# Patient Record
Sex: Male | Born: 1945 | Race: White | Hispanic: No | Marital: Married | Smoking: Never smoker
Health system: Southern US, Community
[De-identification: ages and names within clinical notes are randomized; demographics above are authoritative.]

## PROBLEM LIST (undated history)

## (undated) DIAGNOSIS — I728 Aneurysm of other specified arteries: Secondary | ICD-10-CM

## (undated) DIAGNOSIS — R41841 Cognitive communication deficit: Secondary | ICD-10-CM

## (undated) DIAGNOSIS — K219 Gastro-esophageal reflux disease without esophagitis: Secondary | ICD-10-CM

## (undated) DIAGNOSIS — M6281 Muscle weakness (generalized): Secondary | ICD-10-CM

## (undated) DIAGNOSIS — I69320 Aphasia following cerebral infarction: Secondary | ICD-10-CM

## (undated) DIAGNOSIS — I2510 Atherosclerotic heart disease of native coronary artery without angina pectoris: Secondary | ICD-10-CM

## (undated) DIAGNOSIS — G9340 Encephalopathy, unspecified: Secondary | ICD-10-CM

## (undated) DIAGNOSIS — I150 Renovascular hypertension: Secondary | ICD-10-CM

## (undated) DIAGNOSIS — R531 Weakness: Secondary | ICD-10-CM

## (undated) DIAGNOSIS — I639 Cerebral infarction, unspecified: Secondary | ICD-10-CM

## (undated) DIAGNOSIS — E870 Hyperosmolality and hypernatremia: Secondary | ICD-10-CM

## (undated) DIAGNOSIS — G4733 Obstructive sleep apnea (adult) (pediatric): Secondary | ICD-10-CM

## (undated) DIAGNOSIS — Z931 Gastrostomy status: Secondary | ICD-10-CM

## (undated) DIAGNOSIS — G459 Transient cerebral ischemic attack, unspecified: Secondary | ICD-10-CM

## (undated) DIAGNOSIS — R1310 Dysphagia, unspecified: Secondary | ICD-10-CM

## (undated) DIAGNOSIS — R339 Retention of urine, unspecified: Secondary | ICD-10-CM

## (undated) DIAGNOSIS — L8990 Pressure ulcer of unspecified site, unspecified stage: Secondary | ICD-10-CM

## (undated) DIAGNOSIS — E785 Hyperlipidemia, unspecified: Secondary | ICD-10-CM

## (undated) DIAGNOSIS — R001 Bradycardia, unspecified: Secondary | ICD-10-CM

## (undated) DIAGNOSIS — I619 Nontraumatic intracerebral hemorrhage, unspecified: Secondary | ICD-10-CM

## (undated) DIAGNOSIS — Z955 Presence of coronary angioplasty implant and graft: Secondary | ICD-10-CM

## (undated) DIAGNOSIS — R569 Unspecified convulsions: Secondary | ICD-10-CM

## (undated) DIAGNOSIS — N401 Enlarged prostate with lower urinary tract symptoms: Secondary | ICD-10-CM

## (undated) DIAGNOSIS — N319 Neuromuscular dysfunction of bladder, unspecified: Secondary | ICD-10-CM

## (undated) DIAGNOSIS — R471 Dysarthria and anarthria: Secondary | ICD-10-CM

## (undated) DIAGNOSIS — N183 Chronic kidney disease, stage 3 unspecified: Secondary | ICD-10-CM

## (undated) DIAGNOSIS — I480 Paroxysmal atrial fibrillation: Secondary | ICD-10-CM

## (undated) DIAGNOSIS — R279 Unspecified lack of coordination: Secondary | ICD-10-CM

## (undated) DIAGNOSIS — Z950 Presence of cardiac pacemaker: Secondary | ICD-10-CM

## (undated) DIAGNOSIS — I10 Essential (primary) hypertension: Secondary | ICD-10-CM

## (undated) DIAGNOSIS — M67911 Unspecified disorder of synovium and tendon, right shoulder: Secondary | ICD-10-CM

## (undated) DIAGNOSIS — G8190 Hemiplegia, unspecified affecting unspecified side: Secondary | ICD-10-CM

## (undated) DIAGNOSIS — I4891 Unspecified atrial fibrillation: Secondary | ICD-10-CM

## (undated) DIAGNOSIS — E119 Type 2 diabetes mellitus without complications: Secondary | ICD-10-CM

## (undated) DIAGNOSIS — N400 Enlarged prostate without lower urinary tract symptoms: Secondary | ICD-10-CM

## (undated) DIAGNOSIS — I252 Old myocardial infarction: Secondary | ICD-10-CM

## (undated) DIAGNOSIS — I69159 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: Secondary | ICD-10-CM

## (undated) HISTORY — PX: SPINAL CORD STIMULATOR IMPLANT: SHX2422

## (undated) HISTORY — PX: FEMUR SURGERY: SHX943

## (undated) HISTORY — PX: HIP SURGERY: SHX245

---

## 2016-04-12 DIAGNOSIS — R001 Bradycardia, unspecified: Secondary | ICD-10-CM | POA: Insufficient documentation

## 2020-07-27 DIAGNOSIS — I619 Nontraumatic intracerebral hemorrhage, unspecified: Secondary | ICD-10-CM | POA: Insufficient documentation

## 2020-08-17 DIAGNOSIS — R4701 Aphasia: Secondary | ICD-10-CM | POA: Insufficient documentation

## 2020-08-17 DIAGNOSIS — I69120 Aphasia following nontraumatic intracerebral hemorrhage: Secondary | ICD-10-CM | POA: Insufficient documentation

## 2020-08-17 DIAGNOSIS — G8191 Hemiplegia, unspecified affecting right dominant side: Secondary | ICD-10-CM | POA: Insufficient documentation

## 2020-11-21 DIAGNOSIS — Z8679 Personal history of other diseases of the circulatory system: Secondary | ICD-10-CM | POA: Insufficient documentation

## 2021-05-05 ENCOUNTER — Other Ambulatory Visit: Payer: Self-pay

## 2021-05-05 ENCOUNTER — Emergency Department (HOSPITAL_COMMUNITY): Payer: Medicare PPO

## 2021-05-05 ENCOUNTER — Encounter (HOSPITAL_COMMUNITY): Payer: Self-pay

## 2021-05-05 ENCOUNTER — Emergency Department (HOSPITAL_COMMUNITY)
Admission: EM | Admit: 2021-05-05 | Discharge: 2021-05-06 | Disposition: A | Discharge status: Home / Self Care | Payer: Medicare PPO | Attending: Emergency Medicine | Admitting: Emergency Medicine

## 2021-05-05 DIAGNOSIS — W1830XA Fall on same level, unspecified, initial encounter: Secondary | ICD-10-CM | POA: Insufficient documentation

## 2021-05-05 DIAGNOSIS — Z950 Presence of cardiac pacemaker: Secondary | ICD-10-CM | POA: Diagnosis not present

## 2021-05-05 DIAGNOSIS — E1122 Type 2 diabetes mellitus with diabetic chronic kidney disease: Secondary | ICD-10-CM | POA: Diagnosis not present

## 2021-05-05 DIAGNOSIS — S0083XA Contusion of other part of head, initial encounter: Secondary | ICD-10-CM | POA: Diagnosis not present

## 2021-05-05 DIAGNOSIS — N1832 Chronic kidney disease, stage 3b: Secondary | ICD-10-CM | POA: Insufficient documentation

## 2021-05-05 DIAGNOSIS — I129 Hypertensive chronic kidney disease with stage 1 through stage 4 chronic kidney disease, or unspecified chronic kidney disease: Secondary | ICD-10-CM | POA: Insufficient documentation

## 2021-05-05 DIAGNOSIS — Z955 Presence of coronary angioplasty implant and graft: Secondary | ICD-10-CM | POA: Insufficient documentation

## 2021-05-05 DIAGNOSIS — S0990XA Unspecified injury of head, initial encounter: Secondary | ICD-10-CM | POA: Diagnosis present

## 2021-05-05 DIAGNOSIS — S0033XA Contusion of nose, initial encounter: Secondary | ICD-10-CM | POA: Diagnosis not present

## 2021-05-05 DIAGNOSIS — F0390 Unspecified dementia without behavioral disturbance: Secondary | ICD-10-CM | POA: Diagnosis not present

## 2021-05-05 DIAGNOSIS — W19XXXA Unspecified fall, initial encounter: Secondary | ICD-10-CM

## 2021-05-05 HISTORY — DX: Pressure ulcer of unspecified site, unspecified stage: L89.90

## 2021-05-05 HISTORY — DX: Cerebral infarction, unspecified: I63.9

## 2021-05-05 HISTORY — DX: Old myocardial infarction: I25.2

## 2021-05-05 HISTORY — DX: Essential (primary) hypertension: I10

## 2021-05-05 HISTORY — DX: Unspecified convulsions: R56.9

## 2021-05-05 HISTORY — DX: Encephalopathy, unspecified: G93.40

## 2021-05-05 HISTORY — DX: Chronic kidney disease, stage 3 unspecified: N18.30

## 2021-05-05 HISTORY — DX: Aneurysm of other specified arteries: I72.8

## 2021-05-05 HISTORY — DX: Obstructive sleep apnea (adult) (pediatric): G47.33

## 2021-05-05 HISTORY — DX: 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: I69.159

## 2021-05-05 HISTORY — DX: Dysphagia, unspecified: R13.10

## 2021-05-05 HISTORY — DX: Unspecified atrial fibrillation: I48.91

## 2021-05-05 HISTORY — DX: Aphasia following cerebral infarction: I69.320

## 2021-05-05 HISTORY — DX: Presence of coronary angioplasty implant and graft: Z95.5

## 2021-05-05 HISTORY — DX: Benign prostatic hyperplasia with lower urinary tract symptoms: N40.1

## 2021-05-05 HISTORY — DX: Hemiplegia, unspecified affecting unspecified side: G81.90

## 2021-05-05 HISTORY — DX: Presence of cardiac pacemaker: Z95.0

## 2021-05-05 HISTORY — DX: Type 2 diabetes mellitus without complications: E11.9

## 2021-05-05 HISTORY — DX: Neuromuscular dysfunction of bladder, unspecified: N31.9

## 2021-05-05 HISTORY — DX: Hyperlipidemia, unspecified: E78.5

## 2021-05-05 HISTORY — DX: Dysarthria and anarthria: R47.1

## 2021-05-05 HISTORY — DX: Cognitive communication deficit: R41.841

## 2021-05-05 HISTORY — DX: Weakness: R53.1

## 2021-05-05 HISTORY — DX: Muscle weakness (generalized): M62.81

## 2021-05-05 HISTORY — DX: Renovascular hypertension: I15.0

## 2021-05-05 HISTORY — DX: Benign prostatic hyperplasia without lower urinary tract symptoms: N40.0

## 2021-05-05 HISTORY — DX: Paroxysmal atrial fibrillation: I48.0

## 2021-05-05 HISTORY — DX: Transient cerebral ischemic attack, unspecified: G45.9

## 2021-05-05 HISTORY — DX: Atherosclerotic heart disease of native coronary artery without angina pectoris: I25.10

## 2021-05-05 HISTORY — DX: Nontraumatic intracerebral hemorrhage, unspecified: I61.9

## 2021-05-05 HISTORY — DX: Unspecified lack of coordination: R27.9

## 2021-05-05 HISTORY — DX: Gastrostomy status: Z93.1

## 2021-05-05 HISTORY — DX: Unspecified disorder of synovium and tendon, right shoulder: M67.911

## 2021-05-05 HISTORY — DX: Retention of urine, unspecified: R33.9

## 2021-05-05 HISTORY — DX: Gastro-esophageal reflux disease without esophagitis: K21.9

## 2021-05-05 HISTORY — DX: Bradycardia, unspecified: R00.1

## 2021-05-05 HISTORY — DX: Hyperosmolality and hypernatremia: E87.0

## 2021-05-05 NOTE — ED Provider Notes (Signed)
?[facility] ?Provider Note ? ? ?CSN: [id number] ?Arrival date & time: 05/05/21  1914 ? ?  ? ?History ? ?Chief Complaint  ?Patient presents with  ? Fall  ? ? ?Dylan Martin is a 76 y.o. male. ? ? ?Fall ?Pertinent negatives include no chest pain and no abdominal pain. Patient presents from nursing home.  Found on the floor.  Swelling on forehead.  Without complaint but has baseline dementia.  Contractions of lower extremities.  Hematoma on right forehead.  Not on blood thinners.  Unknown loss conscious. ? ?  ?Past Medical History:  ?Diagnosis Date  ? 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 Aphasia following cerebral infarction   ? Atherosclerotic heart disease of native coronary artery without angina pectoris   ? Benign prostatic hyperplasia with lower urinary tract symptoms   ? Benign prostatic hyperplasia without lower urinary tract symptoms   ? Bradycardia, unspecified   ? Cerebral infarction [facility])   ? Chronic kidney disease, stage 3 unspecified ([place])   ? Cognitive communication deficit   ? Dysarthria   ? Dysphagia   ? Encephalopathy   ? Essential (primary) hypertension   ? Gastro-esophageal reflux disease without esophagitis   ? 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 Hemiplegia ([place])   ? 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 Hyperlipidemia   ? Hyperosmolality and hypernatremia   ? Muscle weakness (generalized)   ? Myocardial infarct, old   ? Neuromuscular dysfunction of bladder, unspecified   ? Nontraumatic intracerebral hemorrhage (HCC)   ? Nontraumatic intracerebral hemorrhage (HCC)   ? Obstructive sleep apnea   ? Paroxysmal atrial fibrillation (HCC)   ? Presence of cardiac pacemaker   ? Presence of coronary angioplasty implant and graft   ? Pressure ulcer   ? Renovascular hypertension   ? Retention of urine   ? Seizures ([place])   ? TIA (transient ischemic attack)   ? Type 2 diabetes mellitus without complication (HCC)   ?  Unspecified atrial fibrillation ([place])   ? Unspecified disorder of synovium and tendon, right shoulder   ? Unspecified lack of coordination   ? Weakness   ? ? ?Home Medications ?Prior to Admission medications   ?Not on File  ?   ? ?Allergies    ?Shellfish allergy   ? ?Review of Systems   ?Review of Systems  ?Cardiovascular:  Negative for chest pain.  ?Gastrointestinal:  Negative for abdominal pain.  ? ?Physical Exam ?Updated Vital Signs ?BP 123/73   Pulse (!) 58   Temp 98.4 ?F (36.9 ?C) (Oral)   Resp 19   Ht '5\' 10"'$  (1.778 m)   Wt 81.6 kg   SpO2 (P) 93%   BMI 25.83 kg/m?  ?Physical Exam ?Vitals and nursing note reviewed.  ?HENT:  ?   Head:  ?   Comments: Hematoma right supraorbital area.  Abrasion/superficial laceration to bridge of nose.  No midline cervical spine tenderness.  Jaw nontender. ?Cardiovascular:  ?   Rate and Rhythm: Regular rhythm.  ?Neurological:  ?   Mental Status: He is alert.  ?   Comments: At reported baseline.  Confused and difficult to understand.  Contractions of extremities.  No tenderness to extremities.  ? ? ?ED Results / Procedures / Treatments   ?Labs ?(all labs ordered are listed, but only abnormal results are displayed) ?Labs Reviewed - No data to display ? ?EKG ?None ? ?Radiology ?CT Head Wo Contrast ? ?Result Date: 05/05/2021 ?  CLINICAL DATA:  Facial trauma, blunt; Neck trauma (Age >= 65y); Head trauma, minor (Age >= 65y). Fall unwitnessed EXAM: CT HEAD WITHOUT CONTRAST CT MAXILLOFACIAL WITHOUT CONTRAST CT CERVICAL SPINE WITHOUT CONTRAST TECHNIQUE: Multidetector CT imaging of the head, cervical spine, and maxillofacial structures were performed using the standard protocol without intravenous contrast. Multiplanar CT image reconstructions of the cervical spine and maxillofacial structures were also generated. RADIATION DOSE REDUCTION: This exam was performed according to the departmental dose-optimization program which includes automated exposure control, adjustment of the mA and/or  kV according to patient size and/or use of iterative reconstruction technique. COMPARISON:  None. FINDINGS: CT HEAD FINDINGS BRAIN: BRAIN Cerebral ventricle sizes are concordant with the degree of cerebral volume loss. Patchy and confluent areas of decreased attenuation are noted throughout the deep and periventricular [place] matter of the cerebral hemispheres bilaterally, compatible with chronic microvascular ischemic disease. No evidence of large-territorial acute infarction. No parenchymal hemorrhage. No mass lesion. No extra-axial collection. No mass effect or midline shift. No hydrocephalus. Basilar cisterns are patent. Vascular: No hyperdense vessel. Atherosclerotic calcifications are present within the cavernous internal carotid and vertebral arteries. Skull: No acute fracture or focal lesion. Other: Midline slightly rightward lower frontal scalp hematoma formation measuring up to 9 mm. CT MAXILLOFACIAL FINDINGS Osseous: No fracture or mandibular dislocation. No destructive process. Sinuses/Orbits: Paranasal sinuses and mastoid air cells are clear. The orbits are unremarkable. Soft tissues: Negative. CT CERVICAL SPINE FINDINGS Alignment: Normal. Skull base and vertebrae: Multilevel degenerative changes of the spine with associated severe osseous neural foraminal stenosis at the bilateral C3-C4, C4-C5, C5-C6 levels. No acute fracture. No aggressive appearing focal osseous lesion or focal pathologic process. Soft tissues and spinal canal: No prevertebral fluid or swelling. No visible canal hematoma. Upper chest: Unremarkable. Other: Atherosclerotic plaque of the aortic arch and its branches. IMPRESSION: 1. No acute intracranial abnormality. 2. No acute displaced facial fracture. 3. No acute displaced fracture or traumatic listhesis of the cervical spine. 4. Multilevel degenerative changes of the spine with associated severe osseous neural foraminal stenosis at the bilateral C3-C4, C4-C5, C5-C6 levels. 5.  Aortic  Atherosclerosis (ICD10-I70.0). Electronically Signed   By: Iven Finn M.D.   On: 05/05/2021 21:33  ? ?CT Cervical Spine Wo Contrast ? ?Result Date: 05/05/2021 ?CLINICAL DATA:  Facial trauma, blunt; Neck trauma (Age >= 65y); Head trauma, minor (Age >= 65y). Fall unwitnessed EXAM: CT HEAD WITHOUT CONTRAST CT MAXILLOFACIAL WITHOUT CONTRAST CT CERVICAL SPINE WITHOUT CONTRAST TECHNIQUE: Multidetector CT imaging of the head, cervical spine, and maxillofacial structures were performed using the standard protocol without intravenous contrast. Multiplanar CT image reconstructions of the cervical spine and maxillofacial structures were also generated. RADIATION DOSE REDUCTION: This exam was performed according to the departmental dose-optimization program which includes automated exposure control, adjustment of the mA and/or kV according to patient size and/or use of iterative reconstruction technique. COMPARISON:  None. FINDINGS: CT HEAD FINDINGS BRAIN: BRAIN Cerebral ventricle sizes are concordant with the degree of cerebral volume loss. Patchy and confluent areas of decreased attenuation are noted throughout the deep and periventricular [place] matter of the cerebral hemispheres bilaterally, compatible with chronic microvascular ischemic disease. No evidence of large-territorial acute infarction. No parenchymal hemorrhage. No mass lesion. No extra-axial collection. No mass effect or midline shift. No hydrocephalus. Basilar cisterns are patent. Vascular: No hyperdense vessel. Atherosclerotic calcifications are present within the cavernous internal carotid and vertebral arteries. Skull: No acute fracture or focal lesion. Other: Midline slightly rightward lower frontal scalp hematoma formation measuring up  to 9 mm. CT MAXILLOFACIAL FINDINGS Osseous: No fracture or mandibular dislocation. No destructive process. Sinuses/Orbits: Paranasal sinuses and mastoid air cells are clear. The orbits are unremarkable. Soft tissues:  Negative. CT CERVICAL SPINE FINDINGS Alignment: Normal. Skull base and vertebrae: Multilevel degenerative changes of the spine with associated severe osseous neural foraminal stenosis at the bilateral C3-C4, C4-C

## 2021-05-05 NOTE — ED Notes (Signed)
Pt returned from CT °

## 2021-05-05 NOTE — ED Triage Notes (Signed)
Pt brought in by [facility] from [facility] for unwitnessed fall. Pt has swelling and abrasion to bridge of nose. Pt not on blood thinners.  ?

## 2021-05-05 NOTE — ED Notes (Signed)
Report called to SNF. EMS called for transport. ?

## 2021-05-05 NOTE — Discharge Instructions (Signed)
The CAT scan showed some swelling above his eye but no other severe injury. ?

## 2021-05-05 NOTE — ED Triage Notes (Signed)
Pt arrived to ED via [facility] from [facility]. Pt was found on floor at facility, unwitnessed fall. Pt has swelling on forehead and nose. Pt denies pain at this time. Pt is confused on arrival, is baseline per report. Pt alert, oriented to person, moving as per normal.  ?

## 2021-05-21 ENCOUNTER — Ambulatory Visit (INDEPENDENT_AMBULATORY_CARE_PROVIDER_SITE_OTHER): Payer: Medicare PPO | Admitting: Urology

## 2021-05-21 ENCOUNTER — Encounter: Payer: Self-pay | Admitting: Urology

## 2021-05-21 VITALS — BP 115/75 | HR 76 | Ht 69.0 in | Wt 176.0 lb

## 2021-05-21 DIAGNOSIS — N319 Neuromuscular dysfunction of bladder, unspecified: Secondary | ICD-10-CM | POA: Diagnosis not present

## 2021-05-21 DIAGNOSIS — Z435 Encounter for attention to cystostomy: Secondary | ICD-10-CM | POA: Diagnosis not present

## 2021-05-21 DIAGNOSIS — R339 Retention of urine, unspecified: Secondary | ICD-10-CM | POA: Diagnosis not present

## 2021-05-21 NOTE — Progress Notes (Signed)
Suprapubic Cath Change ? ?Patient is present today for a suprapubic catheter change due to urinary retention.  18m of water was drained from the balloon, a 20FR foley cath was removed from the tract with out difficulty.  Site was cleaned and prepped in a sterile fashion with betadine.  A 20FR foley cath was replaced into the tract no complications were noted. Urine return was noted, 10 ml of sterile water was inflated into the balloon and a bed bag was attached for drainage.  Patient tolerated well. A night bag was given to patient and proper instruction was given on how to switch bags.   ? ?Performed by: [place] LPN ? ?Follow up: Follow up as scheduled.   ?

## 2021-05-21 NOTE — Progress Notes (Addendum)
Assessment: 1. Neurogenic bladder   2. Encounter for care or replacement of suprapubic tube [facility])     Plan: SP tube changed today with 20 French Foley catheter. D/C tamsulosin - not indicated with SP tube Return to office in 1 month for SP tube change. Please transport patient on a stretcher to facilitate SP tube change.  Addendum from 06/08/2021: Review of outside records from Dr. Buelah Manis indicates the patient was found to have bilateral renal masses on CT imaging from 01/31/2021.  A 4.2 cm renal mass was noted on the right.  A 1.6 cm heterogeneous mass was noted on the left.  Both of these masses concerning for malignancy by report.  Also noted was an ill-defined lytic lesion in the L2 vertebral body.  CT scan from December 2022 showed no evidence of pulmonary metastasis.  Patient at that time were for close follow-up with a repeat CT scan of the chest and abdomen in July 2023.  Chief Complaint:  Chief Complaint  Patient presents with   neurogenic bladder    History of Present Illness:  Dylan Martin is a 76 y.o. year old male who is seen in consultation from Lavone Neri, AGNP-C for evaluation of a neurogenic bladder and chronic suprapubic tube.  He has recently relocated to the area and is currently going to rehab at [facility] facility.  He had a SP tube placed in October 2022 while living in the [place] area.  This was done for urinary retention and a neurogenic bladder.  The catheter has been draining well.  He has undergone monthly tube changes by the urologist there.  His tube was last changed on April 17, 2021.  He has not had problems with UTIs.  No recent gross hematuria.   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[place])    Aphasia following cerebral infarction    Atherosclerotic heart disease of native coronary artery without angina pectoris    Benign prostatic hyperplasia with lower urinary tract  symptoms    Benign prostatic hyperplasia without lower urinary tract symptoms    Bradycardia, unspecified    Cerebral infarction ([place])    Chronic kidney disease, stage 3 unspecified (HCC)    Cognitive communication deficit    Dysarthria    Dysphagia    Encephalopathy    Essential (primary) hypertension    Gastro-esophageal reflux disease without esophagitis    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[place])    Hemiplegia (HCC)    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)    Hyperlipidemia    Hyperosmolality and hypernatremia    Muscle weakness (generalized)    Myocardial infarct, old    Neuromuscular dysfunction of bladder, unspecified    Nontraumatic intracerebral hemorrhage (HCC)    Nontraumatic intracerebral hemorrhage (HCC)    Obstructive sleep apnea    Paroxysmal atrial fibrillation (HCC)    Presence of cardiac pacemaker    Presence of coronary angioplasty implant and graft    Pressure ulcer    Renovascular hypertension    Retention of urine    Seizures (HCC)    TIA (transient ischemic attack)    Type 2 diabetes mellitus without complication ([place])    Unspecified atrial fibrillation (HCC)    Unspecified disorder of synovium and tendon, right shoulder    Unspecified lack of coordination    Weakness     Past Surgical History:  Past Surgical History:  Procedure Laterality Date  FEMUR SURGERY Left    HIP SURGERY Left    SPINAL CORD STIMULATOR IMPLANT     per wife nerve stimulator in back    Allergies:  Allergies  Allergen Reactions   Aspirin Other (See Comments)    dr told pt not to take ASA after aneurysm   Shellfish Allergy     Family History:  No family history on file.  Social History:  Social History   Tobacco Use   Smoking status: Never   Smokeless tobacco: Never  Vaping Use   Vaping Use: Never used  Substance Use Topics   Alcohol use: Not Currently   Drug use: Never    Review of symptoms:   Constitutional:  Negative for unexplained weight loss, night sweats, fever, chills ENT:  Negative for nose bleeds, sinus pain, painful swallowing CV:  Negative for chest pain, shortness of breath, exercise intolerance, palpitations, loss of consciousness Resp:  Negative for cough, wheezing, shortness of breath GI:  Negative for nausea, vomiting, diarrhea, bloody stools GU:  Positives noted in HPI; otherwise negative for gross hematuria, dysuria, urinary incontinence Neuro:  Negative for seizures, poor balance, limb weakness, slurred speech Psych:  Negative for lack of energy, depression, anxiety Endocrine:  Negative for polydipsia, polyuria, symptoms of hypoglycemia (dizziness, hunger, sweating) Hematologic:  Negative for anemia, purpura, petechia, prolonged or excessive bleeding, use of anticoagulants  Allergic:  Negative for difficulty breathing or choking as a result of exposure to anything; no shellfish allergy; no allergic response (rash/itch) to materials, foods  Physical exam: BP 115/75   Pulse 76   Ht '5\' 9"'$  (1.753 m)   Wt 176 lb (79.8 kg)   BMI 25.99 kg/m  GENERAL APPEARANCE:  Chronically ill appearing male, NAD HEENT: Atraumatic, Normocephalic, oropharynx clear. NECK: Supple without lymphadenopathy or thyromegaly. LUNGS: Clear to auscultation bilaterally. HEART: Regular Rate and Rhythm without murmurs, gallops, or rubs. ABDOMEN: Soft, non-tender, No Masses. SP tube in lower abdomen draining clear urine EXTREMITIES: LE contractures; Without clubbing, cyanosis, or edema. NEUROLOGIC:  CN II-XII grossly intact.  MENTAL STATUS:  Non-communicative BACK:  Non-tender to palpation.  No CVAT SKIN:  Warm, dry and intact.    Results: None

## 2021-05-28 ENCOUNTER — Ambulatory Visit: Payer: Medicare PPO | Admitting: Podiatry

## 2021-06-07 ENCOUNTER — Encounter: Payer: Self-pay | Admitting: Podiatry

## 2021-06-07 ENCOUNTER — Ambulatory Visit (INDEPENDENT_AMBULATORY_CARE_PROVIDER_SITE_OTHER): Payer: Medicare PPO | Admitting: Podiatry

## 2021-06-07 DIAGNOSIS — E119 Type 2 diabetes mellitus without complications: Secondary | ICD-10-CM

## 2021-06-07 DIAGNOSIS — M109 Gout, unspecified: Secondary | ICD-10-CM | POA: Insufficient documentation

## 2021-06-07 DIAGNOSIS — B351 Tinea unguium: Secondary | ICD-10-CM

## 2021-06-07 DIAGNOSIS — I10 Essential (primary) hypertension: Secondary | ICD-10-CM | POA: Insufficient documentation

## 2021-06-07 DIAGNOSIS — M79675 Pain in left toe(s): Secondary | ICD-10-CM | POA: Diagnosis not present

## 2021-06-07 DIAGNOSIS — M79674 Pain in right toe(s): Secondary | ICD-10-CM

## 2021-06-07 DIAGNOSIS — I6990 Unspecified sequelae of unspecified cerebrovascular disease: Secondary | ICD-10-CM | POA: Insufficient documentation

## 2021-06-07 DIAGNOSIS — M21962 Unspecified acquired deformity of left lower leg: Secondary | ICD-10-CM | POA: Diagnosis not present

## 2021-06-07 DIAGNOSIS — M2170 Unequal limb length (acquired), unspecified site: Secondary | ICD-10-CM

## 2021-06-07 DIAGNOSIS — Z719 Counseling, unspecified: Secondary | ICD-10-CM | POA: Insufficient documentation

## 2021-06-07 DIAGNOSIS — M21372 Foot drop, left foot: Secondary | ICD-10-CM

## 2021-06-07 DIAGNOSIS — K219 Gastro-esophageal reflux disease without esophagitis: Secondary | ICD-10-CM | POA: Insufficient documentation

## 2021-06-07 NOTE — Patient Instructions (Addendum)
Instructions for facility: -Please check hemoglobin A1c and fax results to me at [phone] -Appoint will be scheduled if he qualifies for diabetic shoes and brace adjustment -Qualify for diabetic shoes he must have a diagnosis of diabetes actively managed.  Prediabetes does not qualify.  Paperwork will need to be signed by MD or DO at facility acknowledging diagnosis and active management that we will fax to them after shoe fitting -Routine appointments will be scheduled every 3 months for nail care

## 2021-06-11 NOTE — Progress Notes (Signed)
  Subjective:  Patient ID: Dylan Martin, male    DOB: [birth year],  MRN: [id number]  Chief Complaint  Patient presents with   Diabetes    [facility]  nail trim  - eval to see if pt needs diabetic shoes    76 y.o. male presents with the above complaint. History confirmed with patient.  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  However he has a limb discrepancy and his left leg is shorter and they would like to see if there is a heel lift that can be applied to his shoe to make the brace more functional..  He was previously diagnosed with diabetes but is no longer on medications for them and is diet controlled now  Objective:  Physical Exam: warm, good capillary refill, no trophic changes or ulcerative lesions, normal DP and PT pulses, normal sensory exam, and approximately 2 cm shorter limb length on left than right.  He has 2 out of 5 strength in plantarflexion on the left side, 1 out of 5 in dorsiflexion Left Foot: dystrophic yellowed discolored nail plates with subungual debris Right Foot: dystrophic yellowed discolored nail plates with subungual debris  Assessment:   1. Acquired deformity of left lower leg   2. Acquired unequal limb length   3. Pain due to onychomycosis of toenails of both feet   4. Diet-controlled diabetes mellitus ([place])      Plan:  Patient was evaluated and treated and all questions answered.  Patient educated on diabetes. Discussed proper diabetic foot care and discussed risks and complications of disease. Educated patient in depth on reasons to return to the office immediately should he/she discover anything concerning or new on the feet. All questions answered. Discussed proper shoes as well.  His diabetes is well controlled he is off medications and is now diet controlled.  I discussed that he may not  qualify for diabetic shoes due to this which they understand.  For his foot drop and acquired limb length discrepancy he does have a brace that they are hopeful he can do more therapy with if it is able to be used but the limb with discrepancy has been difficult to make this happen.  He will be seen by our pedorthist to evaluate  Discussed the etiology and treatment options for the condition in detail with the patient. Educated patient on the topical and oral treatment options for mycotic nails. Recommended debridement of the nails today. Sharp and mechanical debridement performed of all painful and mycotic nails today. Nails debrided in length and thickness using a nail nipper to level of comfort. Discussed treatment options including appropriate shoe gear. Follow up as needed for painful nails.   Return in about 3 months (around 09/07/2021) for at risk foot care.

## 2021-06-19 ENCOUNTER — Ambulatory Visit: Payer: Medicare PPO

## 2021-06-19 DIAGNOSIS — M21962 Unspecified acquired deformity of left lower leg: Secondary | ICD-10-CM

## 2021-06-19 NOTE — Progress Notes (Signed)
Patient seen for consultation for diabetic shoes. Patient has an existing brace from [facility]. Patient is currently in a skilled nursing facility and does not have an established primary care physician or specialist who can certify he has diabetes. Caregiver will establish primary care and determine when he enters Medicare Part B. Caregiver will reach back out to [place] to schedule an appointment when he is eligible for shoes.

## 2021-06-21 ENCOUNTER — Ambulatory Visit: Payer: Medicare PPO | Admitting: Urology

## 2021-06-21 NOTE — Progress Notes (Deleted)
Assessment: 1. Neurogenic bladder   2. Encounter for care or replacement of suprapubic tube ([place])   3. Bilateral renal masses      Plan: SP tube changed today with 20 French Foley catheter. D/C tamsulosin - not indicated with SP tube Return to office in 1 month for SP tube change. Please transport patient on a stretcher to facilitate SP tube change.  Addendum from 06/08/2021: Review of outside records from Dr. Buelah Manis indicates the patient was found to have bilateral renal masses on CT imaging from 01/31/2021.  A 4.2 cm renal mass was noted on the right.  A 1.6 cm heterogeneous mass was noted on the left.  Both of these masses concerning for malignancy by report.  Also noted was an ill-defined lytic lesion in the L2 vertebral body.  CT scan from December 2022 showed no evidence of pulmonary metastasis.  Patient at that time were for close follow-up with a repeat CT scan of the chest and abdomen in July 2023.  Chief Complaint:  No chief complaint on file.   History of Present Illness:  Dylan Martin is a 76 y.o. year old male who is seen for further evaluation of a neurogenic bladder and chronic suprapubic tube.  He has recently relocated to the area and is currently going to rehab at [facility] facility.  He had a SP tube placed in October 2022 while living in the [place] area.  This was done for urinary retention and a neurogenic bladder.  The catheter has been draining well.  He has undergone monthly tube changes by the urologist there.  He has not had problems with UTIs.  No recent gross hematuria.  The SP tube was last changed on 05/21/2021.  Review of outside records from Dr. Buelah Manis indicates the patient was found to have bilateral renal masses on CT imaging from 01/31/2021.  A 4.2 cm renal mass was noted on the right.  A 1.6 cm heterogeneous mass was noted on the left.  Both of these masses were concerning for malignancy by report.  Also noted was an ill-defined lytic  lesion in the L2 vertebral body.  CT scan from December 2022 showed no evidence of pulmonary metastasis.  Plans at that time were for close follow-up with a repeat CT scan of the chest and abdomen in July 2023.  Portions of the above documentation were copied from a prior visit for review purposes only.   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[place])    Aphasia following cerebral infarction    Atherosclerotic heart disease of native coronary artery without angina pectoris    Benign prostatic hyperplasia with lower urinary tract symptoms    Benign prostatic hyperplasia without lower urinary tract symptoms    Bradycardia, unspecified    Cerebral infarction ([place])    Chronic kidney disease, stage 3 unspecified (HCC)    Cognitive communication deficit    Dysarthria    Dysphagia    Encephalopathy    Essential (primary) hypertension    Gastro-esophageal reflux disease without esophagitis    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HCC)    Hemiplegia (HCC)    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)    Hyperlipidemia    Hyperosmolality and hypernatremia    Muscle weakness (generalized)    Myocardial infarct, old    Neuromuscular dysfunction of bladder, unspecified    Nontraumatic intracerebral hemorrhage (HCC)    Nontraumatic  intracerebral hemorrhage (HCC)    Obstructive sleep apnea    Paroxysmal atrial fibrillation (HCC)    Presence of cardiac pacemaker    Presence of coronary angioplasty implant and graft    Pressure ulcer    Renovascular hypertension    Retention of urine    Seizures (HCC)    TIA (transient ischemic attack)    Type 2 diabetes mellitus without complication (HCC)    Unspecified atrial fibrillation (HCC)    Unspecified disorder of synovium and tendon, right shoulder    Unspecified lack of coordination    Weakness     Past Surgical History:  Past Surgical  History:  Procedure Laterality Date   FEMUR SURGERY Left    HIP SURGERY Left    SPINAL CORD STIMULATOR IMPLANT     per wife nerve stimulator in back    Allergies:  Allergies  Allergen Reactions   Aspirin Other (See Comments)    dr told pt not to take ASA after aneurysm   Shellfish Allergy     Family History:  No family history on file.  Social History:  Social History   Tobacco Use   Smoking status: Never   Smokeless tobacco: Never  Vaping Use   Vaping Use: Never used  Substance Use Topics   Alcohol use: Not Currently   Drug use: Never    ROS: Constitutional:  Negative for fever, chills, weight loss CV: Negative for chest pain, previous MI, hypertension Respiratory:  Negative for shortness of breath, wheezing, sleep apnea, frequent cough GI:  Negative for nausea, vomiting, bloody stool, GERD  Physical exam: There were no vitals taken for this visit. GENERAL APPEARANCE:  Well appearing, well developed, well nourished, NAD HEENT:  Atraumatic, normocephalic, oropharynx clear NECK:  Supple without lymphadenopathy or thyromegaly ABDOMEN:  Soft, non-tender, no masses EXTREMITIES:  Moves all extremities well, without clubbing, cyanosis, or edema NEUROLOGIC:  Alert and oriented x 3, normal gait, CN II-XII grossly intact MENTAL STATUS:  appropriate BACK:  Non-tender to palpation, No CVAT SKIN:  Warm, dry, and intact  Results: U/A:

## 2021-06-25 ENCOUNTER — Telehealth: Payer: Self-pay

## 2021-06-25 ENCOUNTER — Other Ambulatory Visit: Payer: Self-pay

## 2021-06-25 DIAGNOSIS — Z95810 Presence of automatic (implantable) cardiac defibrillator: Secondary | ICD-10-CM

## 2021-06-25 NOTE — Telephone Encounter (Signed)
LVM returning patient's wife's phonecall

## 2021-06-26 ENCOUNTER — Ambulatory Visit: Payer: Medicare PPO | Admitting: Cardiovascular Disease

## 2021-06-28 ENCOUNTER — Telehealth: Payer: Self-pay

## 2021-06-28 ENCOUNTER — Ambulatory Visit: Payer: Medicare PPO | Admitting: Urology

## 2021-06-28 NOTE — Telephone Encounter (Signed)
Pts wife lvm for call back from [place].

## 2021-06-28 NOTE — Telephone Encounter (Signed)
LVM returning wife's phonecall

## 2021-06-29 ENCOUNTER — Ambulatory Visit (INDEPENDENT_AMBULATORY_CARE_PROVIDER_SITE_OTHER): Payer: Medicare PPO | Admitting: Urology

## 2021-06-29 ENCOUNTER — Encounter: Payer: Self-pay | Admitting: Urology

## 2021-06-29 DIAGNOSIS — R339 Retention of urine, unspecified: Secondary | ICD-10-CM

## 2021-06-29 DIAGNOSIS — N319 Neuromuscular dysfunction of bladder, unspecified: Secondary | ICD-10-CM | POA: Diagnosis not present

## 2021-06-29 DIAGNOSIS — R8290 Unspecified abnormal findings in urine: Secondary | ICD-10-CM | POA: Diagnosis not present

## 2021-06-29 DIAGNOSIS — N2889 Other specified disorders of kidney and ureter: Secondary | ICD-10-CM | POA: Diagnosis not present

## 2021-06-29 DIAGNOSIS — Z435 Encounter for attention to cystostomy: Secondary | ICD-10-CM

## 2021-06-29 LAB — MICROSCOPIC EXAMINATION
RBC, Urine: >30 /hpf — AB (ref 0–2)
Renal Epithel, UA: NONE SEEN /hpf
WBC, UA: >30 /hpf — AB (ref 0–5)

## 2021-06-29 LAB — URINALYSIS, ROUTINE W REFLEX MICROSCOPIC
Bilirubin, UA: NEGATIVE
Glucose, UA: NEGATIVE
Ketones, UA: NEGATIVE
Nitrite, UA: NEGATIVE
Specific Gravity, UA: <1.005 — ABNORMAL LOW (ref 1.005–1.030)
Urobilinogen, Ur: 0.2 mg/dL (ref 0.2–1.0)
pH, UA: 6 (ref 5.0–7.5)

## 2021-06-29 NOTE — Progress Notes (Signed)
Assessment: 1. Neurogenic bladder   2. Encounter for care or replacement of suprapubic tube ([place])   3. Bilateral renal masses     Plan: SP tube changed today with 20 French Foley catheter. Return to office in 1 month for SP tube change. Urine culture sent for documentation purposes CMP today Schedule for CT chest without contrast and CT abdomen with and without contrast at [facility]  Please transport patient to Urology office on a stretcher to facilitate SP tube change.  Chief Complaint:  Chief Complaint  Patient presents with   Neurogenic bladder    History of Present Illness:  Dylan Martin is a 76 y.o. year old male who is seen for further evaluation of a neurogenic bladder and chronic suprapubic tube.  He has recently relocated to the area and is currently going to rehab at [facility] facility.  He had a SP tube placed in October 2022 while living in the [place] area.  This was done for urinary retention and a neurogenic bladder.  The catheter has been draining well.  He has undergone monthly tube changes by the urologist there.  He has not had problems with UTIs.  No recent gross hematuria.  The SP tube was last changed on 05/21/2021.  Review of outside records from Dr. Buelah Manis indicates the patient was found to have bilateral renal masses on CT imaging from 01/31/2021.  A 4.2 cm renal mass was noted on the right.  A 1.6 cm heterogeneous mass was noted on the left.  Both of these masses were concerning for malignancy by report.  Also noted was an ill-defined lytic lesion in the L2 vertebral body.  CT scan from December 2022 showed no evidence of pulmonary metastasis.  Plans at that time were for close follow-up with a repeat CT scan of the chest and abdomen in July 2023.  He presents today for suprapubic tube change.  His SP tube has been draining well.  His urine has been fairly clear.  No gross hematuria.  No fevers or chills.  Portions of the above documentation  were copied from a prior visit for review purposes only.   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[place])    Aphasia following cerebral infarction    Atherosclerotic heart disease of native coronary artery without angina pectoris    Benign prostatic hyperplasia with lower urinary tract symptoms    Benign prostatic hyperplasia without lower urinary tract symptoms    Bradycardia, unspecified    Cerebral infarction ([place])    Chronic kidney disease, stage 3 unspecified (HCC)    Cognitive communication deficit    Dysarthria    Dysphagia    Encephalopathy    Essential (primary) hypertension    Gastro-esophageal reflux disease without esophagitis    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[place])    Hemiplegia (HCC)    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)    Hyperlipidemia    Hyperosmolality and hypernatremia    Muscle weakness (generalized)    Myocardial infarct, old    Neuromuscular dysfunction of bladder, unspecified    Nontraumatic intracerebral hemorrhage (HCC)    Nontraumatic intracerebral hemorrhage (HCC)    Obstructive sleep apnea    Paroxysmal atrial fibrillation (HCC)    Presence of cardiac pacemaker    Presence of coronary angioplasty implant and graft    Pressure ulcer    Renovascular hypertension    Retention of urine  Seizures ([place])    TIA (transient ischemic attack)    Type 2 diabetes mellitus without complication (HCC)    Unspecified atrial fibrillation (HCC)    Unspecified disorder of synovium and tendon, right shoulder    Unspecified lack of coordination    Weakness     Past Surgical History:  Past Surgical History:  Procedure Laterality Date   FEMUR SURGERY Left    HIP SURGERY Left    SPINAL CORD STIMULATOR IMPLANT     per wife nerve stimulator in back    Allergies:  Allergies  Allergen Reactions   Aspirin Other (See Comments)    dr told pt  not to take ASA after aneurysm   Shellfish Allergy     Family History:  No family history on file.  Social History:  Social History   Tobacco Use   Smoking status: Never   Smokeless tobacco: Never  Vaping Use   Vaping Use: Never used  Substance Use Topics   Alcohol use: Not Currently   Drug use: Never    ROS: Constitutional:  Negative for fever, chills, weight loss CV: Negative for chest pain, previous MI, hypertension Respiratory:  Negative for shortness of breath, wheezing, sleep apnea, frequent cough GI:  Negative for nausea, vomiting, bloody stool, GERD  Physical exam: GENERAL APPEARANCE: Chronically ill appearing, NAD HEENT:  Atraumatic, normocephalic, oropharynx clear NECK:  Supple without lymphadenopathy or thyromegaly ABDOMEN:  Soft, non-tender, no masses; SP tube site with minimal granulation tissue EXTREMITIES:  Without clubbing, cyanosis, or edema NEUROLOGIC:  CN II-XII grossly intact MENTAL STATUS:  appropriate BACK:  Non-tender to palpation, No CVAT SKIN:  Warm, dry, and intact  Results: U/A: >30 RBC, >30 WBC, many bacteria, nitrite negative

## 2021-06-29 NOTE — Progress Notes (Signed)
Suprapubic Cath Change  Patient is present today for a suprapubic catheter change due to urinary retention.  37m of water was drained from the balloon, a 20FR foley cath was removed from the tract with out difficulty.  Site was cleaned and prepped in a sterile fashion with betadine.  A 20FR foley cath was replaced into the tract no complications were noted. Urine return was noted, 10 ml of sterile water was inflated into the balloon and a bed bag was attached for drainage.  Patient tolerated well.  Performed by: KLevi Aland CMA  Follow up: Follow up as scheduled.

## 2021-06-30 LAB — COMPREHENSIVE METABOLIC PANEL
ALT: 6 IU/L (ref 0–44)
AST: 12 IU/L (ref 0–40)
Albumin/Globulin Ratio: 0.9 — ABNORMAL LOW (ref 1.2–2.2)
Albumin: 3.4 g/dL — ABNORMAL LOW (ref 3.7–4.7)
Alkaline Phosphatase: 113 IU/L (ref 44–121)
BUN/Creatinine Ratio: 13 (ref 10–24)
BUN: 17 mg/dL (ref 8–27)
Bilirubin Total: 0.3 mg/dL (ref 0.0–1.2)
CO2: 20 mmol/L (ref 20–29)
Calcium: 9.8 mg/dL (ref 8.6–10.2)
Chloride: 101 mmol/L (ref 96–106)
Creatinine, Ser: 1.28 mg/dL — ABNORMAL HIGH (ref 0.76–1.27)
Globulin, Total: 3.8 g/dL (ref 1.5–4.5)
Glucose: 127 mg/dL — ABNORMAL HIGH (ref 70–99)
Potassium: 4.1 mmol/L (ref 3.5–5.2)
Sodium: 137 mmol/L (ref 134–144)
Total Protein: 7.2 g/dL (ref 6.0–8.5)
eGFR: 57 mL/min/{1.73_m2} — ABNORMAL LOW (ref >59)

## 2021-07-03 LAB — URINE CULTURE

## 2021-07-04 ENCOUNTER — Telehealth: Payer: Self-pay

## 2021-07-04 NOTE — Telephone Encounter (Signed)
LVM for pt to call back regarding a message that was left stating that the pt had questions about the diabetic shoe ordering process.

## 2021-07-04 NOTE — Telephone Encounter (Signed)
Pts wife called in and stated that the pt does not have a PCP in this arear and has not been seen by PCP since last year. I advised the pt to call [facility] to get scheduled for a new patient appointment to establish care so we could start CMN process. Pts wife is going to call back when the appointment is scheduled to give me the information so the paperwork can be faxed over for the appointment.

## 2021-07-11 ENCOUNTER — Telehealth: Payer: Self-pay

## 2021-07-11 ENCOUNTER — Encounter: Payer: Self-pay | Admitting: Family Medicine

## 2021-07-11 ENCOUNTER — Ambulatory Visit (INDEPENDENT_AMBULATORY_CARE_PROVIDER_SITE_OTHER): Payer: Medicare PPO | Admitting: Family Medicine

## 2021-07-11 VITALS — BP 124/74 | HR 62 | Ht 72.0 in | Wt 186.0 lb

## 2021-07-11 DIAGNOSIS — R41841 Cognitive communication deficit: Secondary | ICD-10-CM | POA: Diagnosis not present

## 2021-07-11 DIAGNOSIS — E119 Type 2 diabetes mellitus without complications: Secondary | ICD-10-CM | POA: Insufficient documentation

## 2021-07-11 DIAGNOSIS — N183 Chronic kidney disease, stage 3 unspecified: Secondary | ICD-10-CM | POA: Diagnosis not present

## 2021-07-11 DIAGNOSIS — E785 Hyperlipidemia, unspecified: Secondary | ICD-10-CM | POA: Insufficient documentation

## 2021-07-11 DIAGNOSIS — Z931 Gastrostomy status: Secondary | ICD-10-CM | POA: Insufficient documentation

## 2021-07-11 DIAGNOSIS — G4733 Obstructive sleep apnea (adult) (pediatric): Secondary | ICD-10-CM | POA: Insufficient documentation

## 2021-07-11 DIAGNOSIS — E559 Vitamin D deficiency, unspecified: Secondary | ICD-10-CM

## 2021-07-11 DIAGNOSIS — N400 Enlarged prostate without lower urinary tract symptoms: Secondary | ICD-10-CM

## 2021-07-11 DIAGNOSIS — I69391 Dysphagia following cerebral infarction: Secondary | ICD-10-CM | POA: Insufficient documentation

## 2021-07-11 DIAGNOSIS — M24561 Contracture, right knee: Secondary | ICD-10-CM

## 2021-07-11 DIAGNOSIS — E7849 Other hyperlipidemia: Secondary | ICD-10-CM

## 2021-07-11 DIAGNOSIS — I480 Paroxysmal atrial fibrillation: Secondary | ICD-10-CM | POA: Insufficient documentation

## 2021-07-11 DIAGNOSIS — Z1159 Encounter for screening for other viral diseases: Secondary | ICD-10-CM

## 2021-07-11 DIAGNOSIS — M6281 Muscle weakness (generalized): Secondary | ICD-10-CM

## 2021-07-11 DIAGNOSIS — Z950 Presence of cardiac pacemaker: Secondary | ICD-10-CM

## 2021-07-11 DIAGNOSIS — I4891 Unspecified atrial fibrillation: Secondary | ICD-10-CM

## 2021-07-11 DIAGNOSIS — I69141 Monoplegia of lower limb following nontraumatic intracerebral hemorrhage affecting right dominant side: Secondary | ICD-10-CM

## 2021-07-11 DIAGNOSIS — G8190 Hemiplegia, unspecified affecting unspecified side: Secondary | ICD-10-CM

## 2021-07-11 DIAGNOSIS — R293 Abnormal posture: Secondary | ICD-10-CM

## 2021-07-11 DIAGNOSIS — I150 Renovascular hypertension: Secondary | ICD-10-CM

## 2021-07-11 DIAGNOSIS — M24562 Contracture, left knee: Secondary | ICD-10-CM

## 2021-07-11 NOTE — Telephone Encounter (Signed)
Called [facility] was transferred to vm, left detailed message asking for forms to be faxed to [place] to complete for pt.

## 2021-07-11 NOTE — Telephone Encounter (Signed)
Patient wife saying nurse needs to get in touch with Guadlupe Spanish with [facility] office [address] [place]  [phone] to fill out forms to get diabetic shoes and prescriptions. As appt on 06.29.2023 to get cast for his shoes. Any questions contact wife Benjamine Mola [phone]

## 2021-07-11 NOTE — Patient Instructions (Addendum)
I appreciate the opportunity to provide care to you today!    Follow up:  3 months  Labs: please stop by the lab today to get your blood drawn (CBC, CMP, TSH, Lipid profile, HgA1c, Vit D)  Screening:  Hep C. PSA     Please continue to a heart-healthy diet and increase your physical activities. Try to exercise for 75mns at least three times a week.      It was a pleasure to see you and I look forward to continuing to work together on your health and well-being. Please do not hesitate to call the office if you need care or have questions about your care.   Have a wonderful day and week. With Gratitude, GAlvira MondayMSN, FNP-BC
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
## 2021-07-12 NOTE — Assessment & Plan Note (Addendum)
-  Pending labs -Inform the patient to have [facility] fax [place] the form to complete for the patient's diabetic foot

## 2021-07-13 ENCOUNTER — Telehealth: Payer: Self-pay

## 2021-07-13 NOTE — Telephone Encounter (Signed)
Facility called advising that patient has new wheel chair that reclines fully. They were advised that if the new wheelchair does recline fully then patient did not need to be transported by EMS

## 2021-07-17 ENCOUNTER — Telehealth: Payer: Self-pay | Admitting: Family Medicine

## 2021-07-17 LAB — CMP14+EGFR
ALT: 7 IU/L (ref 0–44)
AST: 11 IU/L (ref 0–40)
Albumin/Globulin Ratio: 1 — ABNORMAL LOW (ref 1.2–2.2)
Albumin: 3.4 g/dL — ABNORMAL LOW (ref 3.7–4.7)
Alkaline Phosphatase: 115 IU/L (ref 44–121)
BUN/Creatinine Ratio: 10 (ref 10–24)
BUN: 11 mg/dL (ref 8–27)
Bilirubin Total: 0.6 mg/dL (ref 0.0–1.2)
CO2: 21 mmol/L (ref 20–29)
Calcium: 9.7 mg/dL (ref 8.6–10.2)
Chloride: 102 mmol/L (ref 96–106)
Creatinine, Ser: 1.12 mg/dL (ref 0.76–1.27)
Globulin, Total: 3.3 g/dL (ref 1.5–4.5)
Glucose: 105 mg/dL — ABNORMAL HIGH (ref 70–99)
Potassium: 3.6 mmol/L (ref 3.5–5.2)
Sodium: 140 mmol/L (ref 134–144)
Total Protein: 6.7 g/dL (ref 6.0–8.5)
eGFR: 67 mL/min/{1.73_m2} (ref >59)

## 2021-07-17 LAB — CBC WITH DIFFERENTIAL/PLATELET
Basophils Absolute: 0.1 10*3/uL (ref 0.0–0.2)
Basos: 1 %
EOS (ABSOLUTE): 0.8 10*3/uL — ABNORMAL HIGH (ref 0.0–0.4)
Eos: 9 %
Hematocrit: 40.2 % (ref 37.5–51.0)
Hemoglobin: 13.2 g/dL (ref 13.0–17.7)
Immature Grans (Abs): 0 10*3/uL (ref 0.0–0.1)
Immature Granulocytes: 1 %
Lymphocytes Absolute: 2.5 10*3/uL (ref 0.7–3.1)
Lymphs: 30 %
MCH: 27.7 pg (ref 26.6–33.0)
MCHC: 32.8 g/dL (ref 31.5–35.7)
MCV: 85 fL (ref 79–97)
Monocytes Absolute: 0.7 10*3/uL (ref 0.1–0.9)
Monocytes: 9 %
Neutrophils Absolute: 4.2 10*3/uL (ref 1.4–7.0)
Neutrophils: 50 %
Platelets: 316 10*3/uL (ref 150–450)
RBC: 4.76 x10E6/uL (ref 4.14–5.80)
RDW: 14.2 % (ref 11.6–15.4)
WBC: 8.3 10*3/uL (ref 3.4–10.8)

## 2021-07-17 LAB — LIPID PANEL
Chol/HDL Ratio: 2.8 ratio (ref 0.0–5.0)
Cholesterol, Total: 90 mg/dL — ABNORMAL LOW (ref 100–199)
HDL: 32 mg/dL — ABNORMAL LOW (ref >39)
LDL Chol Calc (NIH): 39 mg/dL (ref 0–99)
Triglycerides: 102 mg/dL (ref 0–149)
VLDL Cholesterol Cal: 19 mg/dL (ref 5–40)

## 2021-07-17 LAB — PSA, TOTAL AND FREE
PSA, Free Pct: 25 %
PSA, Free: 0.55 ng/mL
Prostate Specific Ag, Serum: 2.2 ng/mL (ref 0.0–4.0)

## 2021-07-17 LAB — TSH+FREE T4
Free T4: 1.54 ng/dL (ref 0.82–1.77)
TSH: 1.21 u[IU]/mL (ref 0.450–4.500)

## 2021-07-17 LAB — VITAMIN D 25 HYDROXY (VIT D DEFICIENCY, FRACTURES): Vit D, 25-Hydroxy: 22.8 ng/mL — ABNORMAL LOW (ref 30.0–100.0)

## 2021-07-17 LAB — HEPATITIS C ANTIBODY: Hep C Virus Ab: NONREACTIVE

## 2021-07-17 LAB — HEMOGLOBIN A1C
Est. average glucose Bld gHb Est-mCnc: 123 mg/dL
Hgb A1c MFr Bld: 5.9 % — ABNORMAL HIGH (ref 4.8–5.6)

## 2021-07-18 ENCOUNTER — Other Ambulatory Visit: Payer: Self-pay | Admitting: Family Medicine

## 2021-07-18 ENCOUNTER — Telehealth: Payer: Self-pay | Admitting: Family Medicine

## 2021-07-18 DIAGNOSIS — G4730 Sleep apnea, unspecified: Secondary | ICD-10-CM

## 2021-07-18 NOTE — Telephone Encounter (Signed)
Returned call discussed sleep study and referral.

## 2021-07-18 NOTE — Telephone Encounter (Signed)
Referral placed.

## 2021-07-18 NOTE — Telephone Encounter (Signed)
  Can she please provide me with more information about the need to be referred to pulmonary

## 2021-07-18 NOTE — Progress Notes (Signed)
Please inform the patient that he has prediabetes and his Vit D is low. I recommend taking  OTC Vit D 5000iu  daily

## 2021-07-18 NOTE — Telephone Encounter (Signed)
Would like a referral to Pulmonolgy and then have the sleep study.

## 2021-07-18 NOTE — Telephone Encounter (Signed)
Pt wife called back in regards to the overnight sleep study & referral to [facility]

## 2021-07-18 NOTE — Telephone Encounter (Signed)
I've referred him to get an overnight sleep study at [facility].

## 2021-07-18 NOTE — Telephone Encounter (Signed)
Needs a sleep medicine doctor, wife wants to get him scheduled with pulmonary because someone at our office told her that our pulmonary doctor in our office does sleep medicine also. Wife states his sleeping mask is tore and she needs to get him one asap through a sleep doctor.

## 2021-07-19 ENCOUNTER — Telehealth: Payer: Self-pay | Admitting: Family Medicine

## 2021-07-19 ENCOUNTER — Telehealth: Payer: Self-pay

## 2021-07-19 ENCOUNTER — Other Ambulatory Visit: Payer: Medicare PPO

## 2021-07-19 ENCOUNTER — Other Ambulatory Visit: Payer: Self-pay | Admitting: Family Medicine

## 2021-07-19 ENCOUNTER — Ambulatory Visit (INDEPENDENT_AMBULATORY_CARE_PROVIDER_SITE_OTHER): Payer: Medicare PPO | Admitting: Family Medicine

## 2021-07-19 ENCOUNTER — Encounter: Payer: Self-pay | Admitting: Family Medicine

## 2021-07-19 DIAGNOSIS — G4730 Sleep apnea, unspecified: Secondary | ICD-10-CM

## 2021-07-19 NOTE — Telephone Encounter (Signed)
Please advise her to schedule an tele visit

## 2021-07-19 NOTE — Telephone Encounter (Signed)
Appt needs to be scheduled to discuss this per provider, telephone appt scheduled on 06/29 at 11:40am

## 2021-07-19 NOTE — Telephone Encounter (Signed)
Patient returning a mis call from nurse. Call back # [phone].

## 2021-07-19 NOTE — Telephone Encounter (Signed)
Please contact patient spouse [phone]

## 2021-07-19 NOTE — Telephone Encounter (Signed)
Spoke to pt's wife had questions about a1c, and podiatry appt.

## 2021-07-19 NOTE — Progress Notes (Addendum)
Virtual Visit via Telephone Note   This visit type was conducted due to national recommendations for restrictions regarding the COVID-19 Pandemic (e.g. social distancing) in an effort to limit this patient's exposure and mitigate transmission in our community.  Due to his co-morbid illnesses, this patient is at least at moderate risk for complications without adequate follow up.  This format is felt to be most appropriate for this patient at this time.  The patient did not have access to video technology/had technical difficulties with video requiring transitioning to audio format only (telephone).  All issues noted in this document were discussed and addressed.  No physical exam could be performed with this format.  Please refer to the patient's chart for his  consent to telehealth for [facility].   Evaluation Performed:  Follow-up visit  Date:  07/19/2021   ID:  Dylan Martin, DOB [birth year], MRN [id number]  Patient Location: Home Provider Location: Office/Clinic  Participants: Patient Location of Patient: Home Location of Provider: Telehealth Consent was obtain for visit to be over via telehealth. I verified that I am speaking with the correct person using two identifiers.  PCP:  Alvira Monday, FNP   Chief Complaint:  CPAP mask  History of Present Illness:    Dylan Martin is a 76 y.o. male with request from his spouse for a new mask for his cpap machine, the seal at the nose is torn off of the mask. The patient's wife stated the company that prescribed the mask is out of business, and she has been getting his mask at the facility in [place]. She notes to be following up with [facility] on 08/02/21.   The patient does not have symptoms concerning for COVID-19 infection (fever, chills, cough, or new shortness of breath).   Past Medical, Surgical, Social History, Allergies, and Medications have been Reviewed.  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[place])    Aphasia following cerebral infarction    Atherosclerotic heart disease of native coronary artery without angina pectoris    Benign prostatic hyperplasia with lower urinary tract symptoms    Benign prostatic hyperplasia without lower urinary tract symptoms    Bradycardia, unspecified    Cerebral infarction ([place])    Chronic kidney disease, stage 3 unspecified (HCC)    Cognitive communication deficit    Dysarthria    Dysphagia    Encephalopathy    Essential (primary) hypertension    Gastro-esophageal reflux disease without esophagitis    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[place])    Hemiplegia (HCC)    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)    Hyperlipidemia    Hyperosmolality and hypernatremia    Muscle weakness (generalized)    Myocardial infarct, old    Neuromuscular dysfunction of bladder, unspecified    Nontraumatic intracerebral hemorrhage (HCC)    Nontraumatic intracerebral hemorrhage (HCC)    Obstructive sleep apnea    Paroxysmal atrial fibrillation (HCC)    Presence of cardiac pacemaker    Presence of coronary angioplasty implant and graft    Pressure ulcer    Renovascular hypertension    Retention of urine    Seizures (HCC)    TIA (transient ischemic attack)    Type 2 diabetes mellitus without complication ([place])    Unspecified atrial fibrillation (HCC)    Unspecified disorder of synovium and tendon, right shoulder    Unspecified lack of coordination    Weakness    Past  Surgical History:  Procedure Laterality Date   FEMUR SURGERY Left    HIP SURGERY Left    SPINAL CORD STIMULATOR IMPLANT     per wife nerve stimulator in back     Current Meds  Medication Sig   Accu-Chek Softclix Lancets lancets    acetaminophen (TYLENOL) 325 MG tablet Take by mouth.   Amino Acids-Protein Hydrolys (PRO-STAT AWC) LIQD Take by mouth.   amLODipine (NORVASC) 10 MG tablet Take by mouth.   Ascorbic  Acid 500 MG/5ML LIQD  500 mg = 5 mL, PEG Tube, Daily, # 150 mL, 0 Refill(s)   atorvastatin (LIPITOR) 80 MG tablet Take by mouth.   bisacodyl (DULCOLAX) 10 MG suppository  10 mg = 1 supp, Rectally, Daily, for constipation, # 10 supp, 0 Refill(s)   cyclobenzaprine (FLEXERIL) 5 MG tablet Take by mouth.   fexofenadine (ALLEGRA) 60 MG tablet Take by mouth.   FLUoxetine (PROZAC) 20 MG capsule Take by mouth.   FLUoxetine (PROZAC) 20 MG/5ML solution  20 mg = 5 mL, PEG Tube, Daily, # 150 mL, 0 Refill(s)   hydrALAZINE (APRESOLINE) 25 MG tablet Take by mouth.   insulin glargine-yfgn (SEMGLEE) 100 UNIT/ML Pen Inject into the skin.   insulin regular (NOVOLIN R) 100 units/mL injection Inject into the skin.   lisinopril (ZESTRIL) 40 MG tablet Take by mouth.   melatonin 3 MG TABS tablet  3 mg = 1 tab(s), PEG Tube, QHS, for insomnia, # 60 tab(s), 0 Refill(s)   Menthol-Zinc Oxide 0.44-20.6 % OINT Apply topically daily.   metoprolol succinate (TOPROL-XL) 50 MG 24 hr tablet Take 50 mg by mouth 2 (two) times daily.   metoprolol tartrate (LOPRESSOR) 50 MG tablet Take by mouth.   nitroGLYCERIN (NITROSTAT) 0.4 MG SL tablet Place under the tongue.   ondansetron (ZOFRAN) 4 MG tablet Take by mouth.   pantoprazole (PROTONIX) 40 MG tablet Take by mouth.   pantoprazole sodium (PROTONIX) 40 mg Take by mouth.   polyethylene glycol powder (GLYCOLAX/MIRALAX) 17 GM/SCOOP powder Take by mouth.   SANTYL 250 UNIT/GM ointment Apply topically.   sodium bicarbonate 650 MG tablet  1,950 mg = 3 tab(s), PEG Tube, Daily, # 60 tab(s), 0 Refill(s)   tamsulosin (FLOMAX) 0.4 MG CAPS capsule Take 0.4 mg by mouth daily.   thiamine 100 MG tablet  100 mg = 1 tab(s), PEG Tube, Daily, 0 Refill(s)     Allergies:   Aspirin and Shellfish allergy   ROS:   Please see the history of present illness.     All other systems reviewed and are negative.   Labs/Other Tests and Data Reviewed:    Recent Labs: 07/16/2021: ALT 7; BUN 11;  Creatinine, Ser 1.12; Hemoglobin 13.2; Platelets 316; Potassium 3.6; Sodium 140; TSH 1.210   Recent Lipid Panel Lab Results  Component Value Date/Time   CHOL 90 (L) 07/16/2021 08:18 AM   TRIG 102 07/16/2021 08:18 AM   HDL 32 (L) 07/16/2021 08:18 AM   CHOLHDL 2.8 07/16/2021 08:18 AM   LDLCALC 39 07/16/2021 08:18 AM    Wt Readings from Last 3 Encounters:  07/11/21 186 lb (84.4 kg)  05/21/21 176 lb (79.8 kg)  05/05/21 180 lb (81.6 kg)     Objective:    Vital Signs:  There were no vitals taken for this visit.     ASSESSMENT & PLAN:    CPAP Mask -pt states that she went to a [employer] and saw the mask that her husband wears and would like an  rx for one -informed pt to contact [place] to fax me the necessary forms to complete  pt is following upwith [facility] on 08/02/21  Time:   Today, I have spent 20 minutes reviewing the chart, including problem list, medications, and with the patient with telehealth technology discussing the above problems.   Medication Adjustments/Labs and Tests Ordered: Current medicines are reviewed at length with the patient today.  Concerns regarding medicines are outlined above.   Tests Ordered: No orders of the defined types were placed in this encounter.   Medication Changes: No orders of the defined types were placed in this encounter.    Note: This dictation was prepared with Dragon dictation along with smaller phrase technology. Similar sounding words can be transcribed inadequately or may not be corrected upon review. Any transcriptional errors that result from this process are unintentional.      Disposition:  Follow up  Signed, Alvira Monday, FNP  07/19/2021 12:02 PM     [place]

## 2021-07-19 NOTE — Telephone Encounter (Signed)
Tele visit scheduled on 06/29 at 11:40am

## 2021-07-19 NOTE — Telephone Encounter (Signed)
scheduled

## 2021-07-19 NOTE — Telephone Encounter (Signed)
Pt picked up lab results.

## 2021-07-19 NOTE — Telephone Encounter (Signed)
Returning lab result call

## 2021-07-19 NOTE — Telephone Encounter (Signed)
Pt wife called wanting to know if he can get a RX for a new mask for his CPAP machine? Alos, has some questions about sleep study

## 2021-07-19 NOTE — Telephone Encounter (Signed)
Patient spouse came in office needs 2 copies of lab results. To pick up

## 2021-07-19 NOTE — Telephone Encounter (Signed)
Patient spouse came back into the office needs the order mask Resmed mask needs a form from [employer] to be filled out, [employer] does not accept [employer].

## 2021-07-19 NOTE — Telephone Encounter (Signed)
Pt's wife has been informed.  

## 2021-07-19 NOTE — Telephone Encounter (Signed)
Patient spouse is asking can patient get a new mask for his resmed cpap machine, the seal at the nose is torn off of mask.  Needs prescription sent to [facility]. Please contact patient.  Pharmacy: [facility]

## 2021-07-20 ENCOUNTER — Telehealth: Payer: Self-pay

## 2021-07-20 NOTE — Telephone Encounter (Signed)
No orders was given, please ask if she would like to scheduled a tele visit today

## 2021-07-20 NOTE — Telephone Encounter (Signed)
Was there an order given for a sleep face mask at tele visit yesterday?

## 2021-07-20 NOTE — Telephone Encounter (Signed)
Spoke to wife

## 2021-07-20 NOTE — Telephone Encounter (Signed)
Spoke to spouse, she states nursing home called her and stated pt cannot have a provider at nursing home and an outside provider, pt will continue to be treated at the nursing home once he is released she will bring him to establish here and follow up.

## 2021-07-20 NOTE — Telephone Encounter (Signed)
Spoke to spouse states pt will continue to be treated at the nursing home where he is at now for rehab, she will bring him to follow up with [place] once he is released.

## 2021-07-20 NOTE — Telephone Encounter (Signed)
Patient spouse called and asked if nurse would please return her call [phone] today.

## 2021-07-26 ENCOUNTER — Ambulatory Visit (INDEPENDENT_AMBULATORY_CARE_PROVIDER_SITE_OTHER): Payer: Medicare PPO | Admitting: Urology

## 2021-07-26 ENCOUNTER — Encounter: Payer: Self-pay | Admitting: Urology

## 2021-07-26 DIAGNOSIS — N2889 Other specified disorders of kidney and ureter: Secondary | ICD-10-CM | POA: Diagnosis not present

## 2021-07-26 DIAGNOSIS — Z435 Encounter for attention to cystostomy: Secondary | ICD-10-CM | POA: Diagnosis not present

## 2021-07-26 DIAGNOSIS — N319 Neuromuscular dysfunction of bladder, unspecified: Secondary | ICD-10-CM | POA: Diagnosis not present

## 2021-07-26 DIAGNOSIS — R339 Retention of urine, unspecified: Secondary | ICD-10-CM

## 2021-07-26 NOTE — Progress Notes (Signed)
Suprapubic Cath Change  Patient is present today for a suprapubic catheter change due to urinary retention.  69m of water was drained from the balloon, a 20FR foley cath was removed from the tract with out difficulty.  Site was cleaned and prepped in a sterile fashion with betadine.  A 20FR foley cath was replaced into the tract no complications were noted. Catheter was flushed with 327mof sterile water with [id number]. 10 ml of sterile water was inflated into the balloon and a bed bag was attached for drainage.  Patient tolerated well.    Performed by: KouLevi AlandMA  Follow up: Follow up in 1 month

## 2021-07-26 NOTE — Progress Notes (Signed)
Assessment: 1. Neurogenic bladder   2. Encounter for care or replacement of suprapubic tube ([place])   3. Bilateral renal masses     Plan: SP tube changed today with 20 French Foley catheter. Return to office in 1 month for SP tube change. CT chest without contrast and CT abdomen with and without contrast at [facility] on 08/09/2021.  Please transport patient to Urology office on a stretcher to facilitate SP tube change.  Chief Complaint:  Chief Complaint  Patient presents with   SP tube change    History of Present Illness:  Dylan Martin is a 76 y.o. year old male who is seen for further evaluation of a neurogenic bladder and chronic suprapubic tube.  He has recently relocated to the area and is currently going to rehab at [facility] facility.  He had a SP tube placed in October 2022 while living in the [place] area.  This was done for urinary retention and a neurogenic bladder.  The catheter has been draining well.  He has undergone monthly tube changes by the urologist there.  He has not had problems with UTIs.  No recent gross hematuria.  The SP tube was last changed on 05/21/2021.  Review of outside records from Dr. Buelah Manis indicates the patient was found to have bilateral renal masses on CT imaging from 01/31/2021.  A 4.2 cm renal mass was noted on the right.  A 1.6 cm heterogeneous mass was noted on the left.  Both of these masses were concerning for malignancy by report.  Also noted was an ill-defined lytic lesion in the L2 vertebral body.  CT scan from December 2022 showed no evidence of pulmonary metastasis.  Plans at that time were for close follow-up with a repeat CT scan of the chest and abdomen in July 2023.  His SP tube was last changed on 06/29/2021.  Urine culture at that time grew >100 K Citrobacter. CMP from 06/29/2021 showed a creatinine of 1.28, normal LFTs. CMP from 07/16/2021 showed a creatinine of 1.12, normal LFTs.  He presents today for suprapubic tube  change.  His SP tube has been draining well.  His urine has been fairly clear.  No gross hematuria.  No fevers or chills.  Portions of the above documentation were copied from a prior visit for review purposes only.   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[place])    Aphasia following cerebral infarction    Atherosclerotic heart disease of native coronary artery without angina pectoris    Benign prostatic hyperplasia with lower urinary tract symptoms    Benign prostatic hyperplasia without lower urinary tract symptoms    Bradycardia, unspecified    Cerebral infarction ([place])    Chronic kidney disease, stage 3 unspecified (HCC)    Cognitive communication deficit    Dysarthria    Dysphagia    Encephalopathy    Essential (primary) hypertension    Gastro-esophageal reflux disease without esophagitis    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HCC)    Hemiplegia (HCC)    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)    Hyperlipidemia    Hyperosmolality and hypernatremia    Muscle weakness (generalized)    Myocardial infarct, old    Neuromuscular dysfunction of bladder, unspecified    Nontraumatic intracerebral hemorrhage (HCC)    Nontraumatic intracerebral hemorrhage (HCC)    Obstructive sleep apnea    Paroxysmal atrial fibrillation ([place])  Presence of cardiac pacemaker    Presence of coronary angioplasty implant and graft    Pressure ulcer    Renovascular hypertension    Retention of urine    Seizures (HCC)    TIA (transient ischemic attack)    Type 2 diabetes mellitus without complication (HCC)    Unspecified atrial fibrillation (HCC)    Unspecified disorder of synovium and tendon, right shoulder    Unspecified lack of coordination    Weakness     Past Surgical History:  Past Surgical History:  Procedure Laterality Date   FEMUR SURGERY Left    HIP SURGERY Left     SPINAL CORD STIMULATOR IMPLANT     per wife nerve stimulator in back    Allergies:  Allergies  Allergen Reactions   Aspirin Other (See Comments)    dr told pt not to take ASA after aneurysm   Shellfish Allergy     Family History:  No family history on file.  Social History:  Social History   Tobacco Use   Smoking status: Never   Smokeless tobacco: Never  Vaping Use   Vaping Use: Never used  Substance Use Topics   Alcohol use: Not Currently   Drug use: Never    ROS: Constitutional:  Negative for fever, chills, weight loss CV: Negative for chest pain, previous MI, hypertension Respiratory:  Negative for shortness of breath, wheezing, sleep apnea, frequent cough GI:  Negative for nausea, vomiting, bloody stool, GERD  Physical exam: GENERAL APPEARANCE:  Chronically ill appearing, well nourished, NAD HEENT:  Atraumatic, normocephalic, oropharynx clear NECK:  Supple without lymphadenopathy or thyromegaly ABDOMEN:  Soft, non-tender, no masses EXTREMITIES:  Without clubbing, cyanosis, or edema NEUROLOGIC:  Alert and oriented x 3,  CN II-XII grossly intact MENTAL STATUS:  appropriate BACK:  Non-tender to palpation, No CVAT SKIN:  Warm, dry, and intact  Results: none

## 2021-08-01 ENCOUNTER — Ambulatory Visit (INDEPENDENT_AMBULATORY_CARE_PROVIDER_SITE_OTHER): Payer: Medicare PPO | Admitting: Internal Medicine

## 2021-08-01 ENCOUNTER — Encounter: Payer: Self-pay | Admitting: Internal Medicine

## 2021-08-01 VITALS — BP 120/72 | HR 88 | Ht 72.0 in | Wt 176.0 lb

## 2021-08-01 DIAGNOSIS — I495 Sick sinus syndrome: Secondary | ICD-10-CM

## 2021-08-01 DIAGNOSIS — I480 Paroxysmal atrial fibrillation: Secondary | ICD-10-CM | POA: Diagnosis not present

## 2021-08-01 NOTE — Patient Instructions (Signed)
Medication Instructions:  Your physician recommends that you continue on your current medications as directed. Please refer to the Current Medication list given to you today.  *If you need a refill on your cardiac medications before your next appointment, please call your pharmacy*   Lab Work: NONE   If you have labs (blood work) drawn today and your tests are completely normal, you will receive your results only by: MyChart Message (if you have MyChart) OR A paper copy in the mail If you have any lab test that is abnormal or we need to change your treatment, we will call you to review the results.   Testing/Procedures: NONE    Follow-Up: At [facility], you and your health needs are our priority.  As part of our continuing mission to provide you with exceptional heart care, we have created designated Provider Care Teams.  These Care Teams include your primary Cardiologist (physician) and Advanced Practice Providers (APPs -  Physician Assistants and Nurse Practitioners) who all work together to provide you with the care you need, when you need it.  We recommend signing up for the patient portal called "MyChart".  Sign up information is provided on this After Visit Summary.  MyChart is used to connect with patients for Virtual Visits (Telemedicine).  Patients are able to view lab/test results, encounter notes, upcoming appointments, etc.  Non-urgent messages can be sent to your provider as well.   To learn more about what you can do with MyChart, go to [email or web address].    Your next appointment:   1 year(s)  The format for your next appointment:   In Person  Provider:   Gregg Taylor, MD    Other Instructions Thank you for choosing [facility]!    Important Information About Sugar       

## 2021-08-01 NOTE — Progress Notes (Signed)
HPI Dylan Martin is a 76 yo man who is referred today for ongoing device management by Dr. Johnsie Cancel. [place]. 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 He is contracted in the legs. He is wheelchair bound.  Allergies  Allergen Reactions   Aspirin Other (See Comments)    dr told pt not to take ASA after aneurysm   Shellfish Allergy      Current Outpatient Medications  Medication Sig Dispense Refill   acetaminophen (TYLENOL) 325 MG tablet Take by mouth.     amLODipine (NORVASC) 10 MG tablet Take by mouth.     Ascorbic Acid 500 MG/5ML LIQD  500 mg = 5 mL, PEG Tube, Daily, # 150 mL, 0 Refill(s)     atorvastatin (LIPITOR) 80 MG tablet Take by mouth.     bisacodyl (DULCOLAX) 10 MG suppository  10 mg = 1 supp, Rectally, Daily, for constipation, # 10 supp, 0 Refill(s)     cyclobenzaprine (FLEXERIL) 5 MG tablet Take by mouth.     fexofenadine (ALLEGRA) 60 MG tablet Take by mouth.     FLUoxetine (PROZAC) 20 MG capsule Take by mouth.     FLUoxetine (PROZAC) 20 MG/5ML solution  20 mg = 5 mL, PEG Tube, Daily, # 150 mL, 0 Refill(s)     hydrALAZINE (APRESOLINE) 25 MG tablet Take by mouth.     lisinopril (ZESTRIL) 40 MG tablet Take by mouth.     melatonin 3 MG TABS tablet  3 mg = 1 tab(s), PEG Tube, QHS, for insomnia, # 60 tab(s), 0 Refill(s)     Menthol-Zinc Oxide 0.44-20.6 % OINT Apply topically daily.     metoprolol succinate (TOPROL-XL) 50 MG 24 hr tablet Take 50 mg by mouth 2 (two) times daily.     nitroGLYCERIN (NITROSTAT) 0.4 MG SL tablet Place under the tongue.     ondansetron (ZOFRAN) 4 MG tablet Take by mouth.     pantoprazole (PROTONIX) 40 MG tablet Take by mouth.     pantoprazole sodium (PROTONIX) 40 mg Take by mouth.     polyethylene glycol powder  (GLYCOLAX/MIRALAX) 17 GM/SCOOP powder Take by mouth.     SANTYL 250 UNIT/GM ointment Apply topically.     sodium bicarbonate 650 MG tablet  1,950 mg = 3 tab(s), PEG Tube, Daily, # 60 tab(s), 0 Refill(s)     thiamine 100 MG tablet  100 mg = 1 tab(s), PEG Tube, Daily, 0 Refill(s)     Accu-Chek Softclix Lancets lancets  (Patient not taking: Reported on 08/01/2021)     insulin glargine-yfgn (SEMGLEE) 100 UNIT/ML Pen Inject into the skin. (Patient not taking: Reported on 08/01/2021)     insulin regular (NOVOLIN R) 100 units/mL injection Inject into the skin. (Patient not taking: Reported on 08/01/2021)     tamsulosin (FLOMAX) 0.4 MG CAPS capsule Take 0.4 mg by mouth daily. (Patient not taking: Reported on 08/01/2021)     No current facility-administered medications for this visit.     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[place])    Aphasia following cerebral infarction    Atherosclerotic heart disease of native coronary artery without angina pectoris    Benign prostatic hyperplasia with  lower urinary tract symptoms    Benign prostatic hyperplasia without lower urinary tract symptoms    Bradycardia, unspecified    Cerebral infarction ([place])    Chronic kidney disease, stage 3 unspecified (HCC)    Cognitive communication deficit    Dysarthria    Dysphagia    Encephalopathy    Essential (primary) hypertension    Gastro-esophageal reflux disease without esophagitis    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[place])    Hemiplegia (HCC)    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)    Hyperlipidemia    Hyperosmolality and hypernatremia    Muscle weakness (generalized)    Myocardial infarct, old    Neuromuscular dysfunction of bladder, unspecified    Nontraumatic intracerebral hemorrhage (HCC)    Nontraumatic intracerebral hemorrhage (HCC)    Obstructive sleep apnea    Paroxysmal atrial fibrillation (HCC)     Presence of cardiac pacemaker    Presence of coronary angioplasty implant and graft    Pressure ulcer    Renovascular hypertension    Retention of urine    Seizures (HCC)    TIA (transient ischemic attack)    Type 2 diabetes mellitus without complication (HCC)    Unspecified atrial fibrillation (HCC)    Unspecified disorder of synovium and tendon, right shoulder    Unspecified lack of coordination    Weakness     ROS:   All systems reviewed and negative except as noted in the HPI.   Past Surgical History:  Procedure Laterality Date   FEMUR SURGERY Left    HIP SURGERY Left    SPINAL CORD STIMULATOR IMPLANT     per wife nerve stimulator in back     No family history on file.   Social History   Socioeconomic History   Marital status: Married    Spouse name: Not on file   Number of children: Not on file   Years of education: Not on file   Highest education level: Not on file  Occupational History   Not on file  Tobacco Use   Smoking status: Never   Smokeless tobacco: Never  Vaping Use   Vaping Use: Never used  Substance and Sexual Activity   Alcohol use: Not Currently   Drug use: Never   Sexual activity: Not on file  Other Topics Concern   Not on file  Social History Narrative   Not on file   Social Determinants of Health   Financial Resource Strain: Not on file  Food Insecurity: Not on file  Transportation Needs: Not on file  Physical Activity: Not on file  Stress: Not on file  Social Connections: Not on file  Intimate Partner Violence: Not on file     BP 120/72   Pulse 88   Ht 6' (1.829 m)   Wt 176 lb (79.8 kg)   SpO2 95%   BMI 23.87 kg/m   Physical Exam:  Chronically ill appearing NAD HEENT: Unremarkable Neck:  No JVD, no thyromegally Lymphatics:  No adenopathy Back:  No CVA tenderness Lungs:  Clear with no wheezes. HEART:  Regular rate rhythm, no murmurs, no rubs, no clicks Abd:  soft, positive bowel sounds, no organomegally, no  rebound, no guarding Ext:  lower extremity contractures.  Skin:  No rashes no nodules Neuro:  right HP  DEVICE  Normal device function.  See PaceArt for details.   Assess/Plan:  PAF - he is asymptomatic and maintaining NSR 99%. He is not a candidate for systemic  anti-coag or for the watchman. CAD - he is s/p MI. No chest pain. R HP - he is contracted. Hopefully this can be worked on.  Carleene Overlie Cloyce Paterson,MD

## 2021-08-02 ENCOUNTER — Ambulatory Visit (INDEPENDENT_AMBULATORY_CARE_PROVIDER_SITE_OTHER): Payer: Medicare PPO | Admitting: Adult Health

## 2021-08-02 ENCOUNTER — Encounter: Payer: Self-pay | Admitting: Adult Health

## 2021-08-02 VITALS — BP 110/70 | HR 67 | Temp 98.3°F | Ht 72.0 in | Wt 186.0 lb

## 2021-08-02 DIAGNOSIS — G4733 Obstructive sleep apnea (adult) (pediatric): Secondary | ICD-10-CM | POA: Diagnosis not present

## 2021-08-02 NOTE — Progress Notes (Signed)
Has had Pfizer vaccines, 2 vaccines and 2 boosters.

## 2021-08-02 NOTE — Progress Notes (Signed)
$'@Patient'R$  ID: Dylan Martin, male    DOB: [birth year], 76 y.o.   MRN: [id number]  Chief Complaint  Patient presents with   Consult    Referring provider: Alvira Monday, FNP  HPI: 76 year old male seen for sleep consult August 02, 2021 to establish for sleep apnea Patient has a very complicated medical history with coronary artery disease, MI, A-fib, cerebral aneurysm and massive ICH, neurogenic bladder, chronic kidney disease  TEST/EVENTS :   08/02/21 Sleep consult  Patient presents for a sleep consult today.  Patient is accompanied by his wife.  He is currently at a local SNF-[facility] home for rehab.  He requires full care.  She says that he has been diagnosed with sleep apnea for greater than 9 years.  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  Says typically he goes to bed between 10 and 11 PM.  And usually gets up somewhere around 9 to 10 AM.  She wants him to establish with local sleep practice.  Needs new supplies.  Does take melatonin for sleep.  According to wife has a high Epworth score 21 out of 24.  Says that he is sleepy throughout the day and has trouble staying awake.  Denies any narcotic medications currently.  Is on Prozac daily.  Has Flexeril to use as needed. Wife is upset that staff at SNF do not help reinforce CPAP usage . Needs new supplies  CPAP download shows 97% compliance with daily average usage at 5.5 hours.  Patient is on auto CPAP 10 to 20 cm H2O.  AHI 31.8.  Positive mask leaks.  Daily average CPAP pressure 13.2 cm H2O.  Patient has a very complicated medical history.  July 27, 2020 patient presented with acute neurological symptoms with right hemiparesis and aphasia found to have a  massive ICH with significant deficits requiring SNF placement.  Patient had a complicated treatment regimen with wound VAC, PEG.,  Right-sided hemiparesis and aphasia.  Neurogenic  bladder with suprapubic catheter.  Has a G-tube but currently only does flushes.  Patient does take oral care foods and liquids.  He is wheelchair and bedbound.  Social history patient is retired.  Has adult children.  He is married.  Previously moved from [facility] to [place] area.  He is a never smoker.  No alcohol.  No drug use.  Family history positive for COPD, heart disease, dementia, prostate cancer   Allergies  Allergen Reactions   Aspirin Other (See Comments)    dr told pt not to take ASA after aneurysm   Shellfish Allergy     Immunization History  Administered Date(s) Administered   Fluad Quad(high Dose 65+) 10/20/2020   Pneumococcal Polysaccharide-23 03/03/2018   Pneumococcal-Unspecified 06/13/2000    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[place])    Aphasia following cerebral infarction    Atherosclerotic heart disease of native coronary artery without angina pectoris    Benign prostatic hyperplasia with lower urinary tract symptoms    Benign prostatic hyperplasia without lower urinary tract symptoms    Bradycardia, unspecified    Cerebral infarction ([place])    Chronic kidney disease, stage 3 unspecified ([place])    Cognitive communication deficit    Dysarthria    Dysphagia    Encephalopathy    Essential (primary) hypertension    Gastro-esophageal reflux disease without esophagitis  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[place])    Hemiplegia (HCC)    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)    Hyperlipidemia    Hyperosmolality and hypernatremia    Muscle weakness (generalized)    Myocardial infarct, old    Neuromuscular dysfunction of bladder, unspecified    Nontraumatic intracerebral hemorrhage (HCC)    Nontraumatic intracerebral hemorrhage (HCC)    Obstructive sleep apnea    Paroxysmal atrial fibrillation (HCC)    Presence of cardiac pacemaker    Presence  of coronary angioplasty implant and graft    Pressure ulcer    Renovascular hypertension    Retention of urine    Seizures (HCC)    TIA (transient ischemic attack)    Type 2 diabetes mellitus without complication (HCC)    Unspecified atrial fibrillation (HCC)    Unspecified disorder of synovium and tendon, right shoulder    Unspecified lack of coordination    Weakness     Tobacco History: Social History   Tobacco Use  Smoking Status Never  Smokeless Tobacco Never   Counseling given: Not Answered   Outpatient Medications Prior to Visit  Medication Sig Dispense Refill   Accu-Chek Softclix Lancets lancets      acetaminophen (TYLENOL) 325 MG tablet Take by mouth.     amLODipine (NORVASC) 10 MG tablet Take by mouth.     Ascorbic Acid 500 MG/5ML LIQD  500 mg = 5 mL, PEG Tube, Daily, # 150 mL, 0 Refill(s)     atorvastatin (LIPITOR) 80 MG tablet Take by mouth.     bisacodyl (DULCOLAX) 10 MG suppository  10 mg = 1 supp, Rectally, Daily, for constipation, # 10 supp, 0 Refill(s)     cyclobenzaprine (FLEXERIL) 5 MG tablet Take by mouth.     fexofenadine (ALLEGRA) 60 MG tablet Take by mouth.     FLUoxetine (PROZAC) 20 MG capsule Take by mouth.     FLUoxetine (PROZAC) 20 MG/5ML solution  20 mg = 5 mL, PEG Tube, Daily, # 150 mL, 0 Refill(s)     hydrALAZINE (APRESOLINE) 25 MG tablet Take by mouth.     insulin glargine-yfgn (SEMGLEE) 100 UNIT/ML Pen Inject into the skin.     insulin regular (NOVOLIN R) 100 units/mL injection Inject into the skin.     lisinopril (ZESTRIL) 40 MG tablet Take by mouth.     melatonin 3 MG TABS tablet  3 mg = 1 tab(s), PEG Tube, QHS, for insomnia, # 60 tab(s), 0 Refill(s)     Menthol-Zinc Oxide 0.44-20.6 % OINT Apply topically daily.     metoprolol succinate (TOPROL-XL) 50 MG 24 hr tablet Take 50 mg by mouth 2 (two) times daily.     nitroGLYCERIN (NITROSTAT) 0.4 MG SL tablet Place under the tongue.     ondansetron (ZOFRAN) 4 MG tablet Take by mouth.      pantoprazole (PROTONIX) 40 MG tablet Take by mouth.     pantoprazole sodium (PROTONIX) 40 mg Take by mouth.     polyethylene glycol powder (GLYCOLAX/MIRALAX) 17 GM/SCOOP powder Take by mouth.     SANTYL 250 UNIT/GM ointment Apply topically.     sodium bicarbonate 650 MG tablet  1,950 mg = 3 tab(s), PEG Tube, Daily, # 60 tab(s), 0 Refill(s)     tamsulosin (FLOMAX) 0.4 MG CAPS capsule Take 0.4 mg by mouth daily.     thiamine 100 MG tablet  100 mg = 1 tab(s), PEG Tube, Daily, 0 Refill(s)     No  facility-administered medications prior to visit.     Review of Systems:   Constitutional:   No  weight loss, night sweats,  Fevers, chills,  +fatigue, or  lassitude.  HEENT:   No headaches,  Difficulty swallowing,  Tooth/dental problems, or  Sore throat,                No sneezing, itching, ear ache, nasal congestion, post nasal drip,   CV:  No chest pain,  Orthopnea, PND, swelling in lower extremities, anasarca, dizziness, palpitations, syncope.   GI  No heartburn, indigestion, abdominal pain, nausea, vomiting, diarrhea, change in bowel habits, loss of appetite, bloody stools.  G-tube  Resp: No shortness of breath with exertion or at rest.  No excess mucus, no productive cough,  No non-productive cough,  No coughing up of blood.  No change in color of mucus.  No wheezing.  No chest wall deformity  Skin: no rash or lesions.  GU: Suprapubic catheter  MS: Right-sided weakness, foot drop   Physical Exam  BP 110/70 (BP Location: Left Arm, Cuff Size: Large)   Pulse 67   SpO2 98%   GEN: A/Ox3; pleasant , NAD, chronically ill-appearing, wheelchair-bound   HEENT:  [place]/AT,  NOSE-clear, THROAT-clear, no lesions, no postnasal drip or exudate noted.  Class III MP airway  NECK:  Supple w/ fair ROM; no JVD; normal carotid impulses w/o bruits; no thyromegaly or nodules palpated; no lymphadenopathy.    RESP  Clear  P & A; w/o, wheezes/ rales/ or rhonchi. no accessory muscle use, no dullness to  percussion  CARD:  RRR, no m/r/g, no peripheral edema, pulses intact, no cyanosis or clubbing.  GI:   Soft & nt; nml bowel sounds; no organomegaly or masses detected.   Musco: Warm bil, lower extremity contractures, foot drop right-sided weakness  Neuro: alert, no focal deficits noted.    Skin: Warm, no lesions or rashes    Lab Results:  CBC    Component Value Date/Time   WBC 8.3 07/16/2021 0818   RBC 4.76 07/16/2021 0818   HGB 13.2 07/16/2021 0818   HCT 40.2 07/16/2021 0818   PLT 316 07/16/2021 0818   MCV 85 07/16/2021 0818   MCH 27.7 07/16/2021 0818   MCHC 32.8 07/16/2021 0818   RDW 14.2 07/16/2021 0818   LYMPHSABS 2.5 07/16/2021 0818   EOSABS 0.8 (H) 07/16/2021 0818   BASOSABS 0.1 07/16/2021 0818    BMET    Component Value Date/Time   NA 140 07/16/2021 0818   K 3.6 07/16/2021 0818   CL 102 07/16/2021 0818   CO2 21 07/16/2021 0818   GLUCOSE 105 (H) 07/16/2021 0818   BUN 11 07/16/2021 0818   CREATININE 1.12 07/16/2021 0818   CALCIUM 9.7 07/16/2021 0818    BNP No results found for: "BNP"  ProBNP No results found for: "PROBNP"  Imaging: No results found.        No data to display          No results found for: "NITRICOXIDE"      Assessment & Plan:   No problem-specific Assessment & Plan notes found for this encounter.     Rexene Edison, NP 08/02/2021

## 2021-08-02 NOTE — Patient Instructions (Signed)
Order for CPAP supplies.  Wear CPAP all night long and with naps.  Have staff put CPAP mask back on when he takes off during sleep.  CPAP care .   Set up for CPAP titration study - can transition to BIPAP if needed.  Follow up in 2-3 months with Dr. Elsworth Soho or Halford Chessman  or Braedon Sjogren NP and As needed  (Try to make at [place] office )

## 2021-08-06 DIAGNOSIS — G4733 Obstructive sleep apnea (adult) (pediatric): Secondary | ICD-10-CM | POA: Insufficient documentation

## 2021-08-06 NOTE — Progress Notes (Signed)
Reviewed and agree with assessment/plan.   Chesley Mires, MD [facility] Pulmonary/Critical Care 08/06/2021, 3:08 PM Pager:  [phone]

## 2021-08-06 NOTE — Assessment & Plan Note (Signed)
Patient with major [place] July 2022 now with significant residual deficits with right-sided hemiparesis neurogenic bladder aphasia impaired mobility-  Continue follow-up with neurology.  Continue with SNF care

## 2021-08-06 NOTE — Assessment & Plan Note (Signed)
Longstanding sleep apnea on nocturnal CPAP.  CPAP download shows significant residual events despite CPAP usage.  Will order new supplies.  Encourage CPAP usage.  But will need a CPAP titration study to further evaluate as patient may need BiPAP versus noninvasive to control it number of events.  Suspect with patient's recent major ICH could be at risk for more complex sleep apnea.  Will need further evaluation Set up for CPAP titration with option to transition to BiPAP if needed  - discussed how weight can impact sleep and risk for sleep disordered breathing - discussed options to assist with weight loss: combination of diet modification, cardiovascular and strength training exercises   - had an extensive discussion regarding the adverse health consequences related to untreated sleep disordered breathing - specifically discussed the risks for hypertension, coronary artery disease, cardiac dysrhythmias, cerebrovascular disease, and diabetes - lifestyle modification discussed   - discussed how sleep disruption can increase risk of accidents, particularly when driving - safe driving practices were discussed   Plan  Patient Instructions  Order for CPAP supplies.  Wear CPAP all night long and with naps.  Have staff put CPAP mask back on when he takes off during sleep.  CPAP care .   Set up for CPAP titration study - can transition to BIPAP if needed.  Follow up in 2-3 months with Dr. Elsworth Soho or Halford Chessman  or Tyree Fluharty NP and As needed  (Try to make at [place] office )

## 2021-08-08 ENCOUNTER — Telehealth: Payer: Self-pay | Admitting: Adult Health

## 2021-08-08 NOTE — Telephone Encounter (Signed)
ATC patients spouse x3. Line was  busy.  [place] patient so routing back to [place].

## 2021-08-08 NOTE — Telephone Encounter (Signed)
Called wife back and got the fax number for the location that patient got original sleep study done at. Will fax over request to get sleep study report. Nothing further needed

## 2021-08-08 NOTE — Telephone Encounter (Signed)
I need this Sleep study before [facility] can process the order

## 2021-08-09 ENCOUNTER — Ambulatory Visit (INDEPENDENT_AMBULATORY_CARE_PROVIDER_SITE_OTHER): Payer: Medicare PPO

## 2021-08-09 ENCOUNTER — Ambulatory Visit (HOSPITAL_COMMUNITY): Payer: Medicare PPO

## 2021-08-09 ENCOUNTER — Ambulatory Visit (HOSPITAL_COMMUNITY)
Admission: RE | Admit: 2021-08-09 | Discharge: 2021-08-09 | Disposition: A | Discharge status: Home / Self Care | Payer: Medicare PPO | Source: Ambulatory Visit | Attending: Urology | Admitting: Urology

## 2021-08-09 DIAGNOSIS — R911 Solitary pulmonary nodule: Secondary | ICD-10-CM | POA: Diagnosis not present

## 2021-08-09 DIAGNOSIS — I700 Atherosclerosis of aorta: Secondary | ICD-10-CM | POA: Insufficient documentation

## 2021-08-09 DIAGNOSIS — I495 Sick sinus syndrome: Secondary | ICD-10-CM | POA: Diagnosis not present

## 2021-08-09 DIAGNOSIS — N2889 Other specified disorders of kidney and ureter: Secondary | ICD-10-CM | POA: Insufficient documentation

## 2021-08-09 LAB — CUP PACEART REMOTE DEVICE CHECK
Battery Remaining Longevity: 68 mo
Battery Voltage: 2.96 V
Brady Statistic AP VP Percent: 95.2 %
Brady Statistic AP VS Percent: 0.01 %
Brady Statistic AS VP Percent: 3.46 %
Brady Statistic AS VS Percent: 1.33 %
Brady Statistic RA Percent Paced: 96.29 %
Brady Statistic RV Percent Paced: 98.65 %
Date Time Interrogation Session: 20230720004908
Implantable Lead Implant Date: 20180327
Implantable Lead Implant Date: 20180327
Implantable Lead Location: 753859
Implantable Lead Location: 753860
Implantable Lead Model: 5076
Implantable Lead Model: 5076
Implantable Pulse Generator Implant Date: 20180327
Lead Channel Impedance Value: 285 Ohm
Lead Channel Impedance Value: 323 Ohm
Lead Channel Impedance Value: 380 Ohm
Lead Channel Impedance Value: 380 Ohm
Lead Channel Pacing Threshold Amplitude: 0.5 V
Lead Channel Pacing Threshold Amplitude: 0.5 V
Lead Channel Pacing Threshold Pulse Width: 0.4 ms
Lead Channel Pacing Threshold Pulse Width: 0.4 ms
Lead Channel Sensing Intrinsic Amplitude: 2 mV
Lead Channel Sensing Intrinsic Amplitude: 2 mV
Lead Channel Sensing Intrinsic Amplitude: 23.5 mV
Lead Channel Sensing Intrinsic Amplitude: 23.5 mV
Lead Channel Setting Pacing Amplitude: 1.5 V
Lead Channel Setting Pacing Amplitude: 2 V
Lead Channel Setting Pacing Pulse Width: 0.4 ms
Lead Channel Setting Sensing Sensitivity: 1.2 mV

## 2021-08-09 MED ORDER — IOHEXOL 300 MG/ML  SOLN
100.0000 mL | Freq: Once | INTRAMUSCULAR | Status: AC | PRN
  Administered 2021-08-09: 100 mL via INTRAVENOUS

## 2021-08-13 ENCOUNTER — Telehealth: Payer: Self-pay | Admitting: Adult Health

## 2021-08-13 NOTE — Telephone Encounter (Signed)
Dylan Martin can you let me know if we ever got a copy faxed to your or [facility] on sleep study.I faxed a request last week 08/08/21.   Please advise

## 2021-08-22 ENCOUNTER — Telehealth: Payer: Self-pay | Admitting: Adult Health

## 2021-08-22 NOTE — Telephone Encounter (Signed)
The wife wanted to make sure the sleep study was previously ordered and I let her know that it was placed on 08/02/2021 and she should be getting a call as soon as possible.   PCC's do you know anything?

## 2021-08-23 ENCOUNTER — Ambulatory Visit (INDEPENDENT_AMBULATORY_CARE_PROVIDER_SITE_OTHER): Payer: Medicare PPO | Admitting: Urology

## 2021-08-23 ENCOUNTER — Encounter: Payer: Self-pay | Admitting: Urology

## 2021-08-23 VITALS — BP 99/63 | HR 62 | Ht 72.0 in | Wt 186.0 lb

## 2021-08-23 DIAGNOSIS — Z435 Encounter for attention to cystostomy: Secondary | ICD-10-CM | POA: Diagnosis not present

## 2021-08-23 DIAGNOSIS — N2889 Other specified disorders of kidney and ureter: Secondary | ICD-10-CM

## 2021-08-23 DIAGNOSIS — N319 Neuromuscular dysfunction of bladder, unspecified: Secondary | ICD-10-CM

## 2021-08-23 DIAGNOSIS — R911 Solitary pulmonary nodule: Secondary | ICD-10-CM | POA: Diagnosis not present

## 2021-08-23 DIAGNOSIS — R339 Retention of urine, unspecified: Secondary | ICD-10-CM | POA: Diagnosis not present

## 2021-08-23 NOTE — Progress Notes (Signed)
Suprapubic Cath Change  Patient is present today for a suprapubic catheter change due to urinary retention.  41m of water was drained from the balloon, a 20FR foley cath was removed from the tract with out difficulty.  Site was cleaned and prepped in a sterile fashion with betadine.  A 20FR foley cath was replaced into the tract no complications were noted. Urine return was noted, 165mof sterile water was inflated into the balloon and a night bag was attached for drainage.  Patient tolerated well. A night bag was given to patient and proper instruction was given on how to switch bags.    Performed by: Maven Varelas LPN   Follow up: Per MD note

## 2021-08-23 NOTE — Progress Notes (Signed)
Assessment: 1. Neurogenic bladder   2. Encounter for care or replacement of suprapubic tube ([place])   3. Bilateral renal masses   4. Pulmonary nodule, left     Plan: I personally reviewed the CT study from 08/11/2021 showing bilateral renal masses suspicious for renal cell carcinoma.  I discussed these findings in detail with the patient's wife.  Options for management of the bilateral renal masses discussed including continued observation, surgical excision, and percutaneous ablation.  I would not recommend surgical management given his overall condition putting him at significant perioperative risk.  Given the findings of a new pulmonary nodule, I think that consultation with oncology would be appropriate for further evaluation. Referral to Oncology SP tube changed today with 20 French Foley catheter. Return to office in 1 month for SP tube change.  Please transport patient to Urology office on a stretcher to facilitate SP tube change.  Chief Complaint:  Chief Complaint  Patient presents with   Neurogenic bladder    History of Present Illness:  Dylan Martin is a 76 y.o. year old male who is seen for further evaluation of a neurogenic bladder and chronic suprapubic tube.  He has recently relocated to the area and is currently going to rehab at [facility] facility.  He had a SP tube placed in October 2022 while living in the [place] area.  This was done for urinary retention and a neurogenic bladder.  The catheter has been draining well.  He has undergone monthly tube changes by the urologist there.  He has not had problems with UTIs.  No recent gross hematuria.  The SP tube was last changed on 05/21/2021.  Review of outside records from Dr. Buelah Manis indicates the patient was found to have bilateral renal masses on CT imaging from 01/31/2021.  A 4.2 cm renal mass was noted on the right.  A 1.6 cm heterogeneous mass was noted on the left.  Both of these masses were concerning for  malignancy by report.  Also noted was an ill-defined lytic lesion in the L2 vertebral body.  CT scan from December 2022 showed no evidence of pulmonary metastasis.  Plans at that time were for close follow-up with a repeat CT scan of the chest and abdomen in July 2023.  His SP tube was last changed on 06/29/2021.  Urine culture at that time grew >100 K Citrobacter. CMP from 06/29/2021 showed a creatinine of 1.28, normal LFTs. CMP from 07/16/2021 showed a creatinine of 1.12, normal LFTs.  His suprapubic tube was last changed on 07/26/2021. CT of the chest, abdomen, and pelvis from 08/11/2021 showed a 4.6 cm heterogeneously enhancing mass in the posterior lower right kidney consistent with renal cell carcinoma, 8 mm enhancing lesion anterior lower pole right kidney, 2.3 x 2.3 x 2.0 cm enhancing mass in the left kidney consistent with renal cell carcinoma and a 10 x 6 mm left lower pole pulmonary nodule.  He presents today for SP tube change and follow-up.  The SP tube has been draining well.  No recent UTI symptoms.  Portions of the above documentation were copied from a prior visit for review purposes only.   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[place])    Aphasia following cerebral infarction    Atherosclerotic heart disease of native coronary artery without angina pectoris    Benign prostatic hyperplasia with lower urinary tract symptoms    Benign prostatic hyperplasia without  lower urinary tract symptoms    Bradycardia, unspecified    Cerebral infarction (HCC)    Chronic kidney disease, stage 3 unspecified (HCC)    Cognitive communication deficit    Dysarthria    Dysphagia    Encephalopathy    Essential (primary) hypertension    Gastro-esophageal reflux disease without esophagitis    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[place])    Hemiplegia (HCC)    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 affecting unspecified  side (HCC)    Hyperlipidemia    Hyperosmolality and hypernatremia    Muscle weakness (generalized)    Myocardial infarct, old    Neuromuscular dysfunction of bladder, unspecified    Nontraumatic intracerebral hemorrhage (HCC)    Nontraumatic intracerebral hemorrhage (HCC)    Obstructive sleep apnea    Paroxysmal atrial fibrillation (HCC)    Presence of cardiac pacemaker    Presence of coronary angioplasty implant and graft    Pressure ulcer    Renovascular hypertension    Retention of urine    Seizures (HCC)    TIA (transient ischemic attack)    Type 2 diabetes mellitus without complication (HCC)    Unspecified atrial fibrillation (HCC)    Unspecified disorder of synovium and tendon, right shoulder    Unspecified lack of coordination    Weakness     Past Surgical History:  Past Surgical History:  Procedure Laterality Date   FEMUR SURGERY Left    HIP SURGERY Left    SPINAL CORD STIMULATOR IMPLANT     per wife nerve stimulator in back    Allergies:  Allergies  Allergen Reactions   Shellfish Allergy    Aspirin Other (See Comments)    dr told pt not to take ASA after aneurysm    Family History:  No family history on file.  Social History:  Social History   Tobacco Use   Smoking status: Never   Smokeless tobacco: Never  Vaping Use   Vaping Use: Never used  Substance Use Topics   Alcohol use: Not Currently   Drug use: Never    ROS: Constitutional:  Negative for fever, chills, weight loss CV: Negative for chest pain, previous MI, hypertension Respiratory:  Negative for shortness of breath, wheezing, sleep apnea, frequent cough GI:  Negative for nausea, vomiting, bloody stool, GERD  Physical exam: BP 99/63   Pulse 62   Ht 6' (1.829 m)   Wt 186 lb (84.4 kg)   BMI 25.23 kg/m  GENERAL APPEARANCE:  Chronically ill appearing, NAD HEENT:  Atraumatic, normocephalic, oropharynx clear NECK:  Supple without lymphadenopathy or thyromegaly ABDOMEN:  Soft,  non-tender, no masses; SP tube draining yellow urine EXTREMITIES:  Moves all extremities well, without clubbing, cyanosis, or edema NEUROLOGIC:  in wheelchair BACK:  Non-tender to palpation, No CVAT SKIN:  Warm, dry, and intact  Results: none

## 2021-08-23 NOTE — Telephone Encounter (Signed)
Dylan Martin just called from [facility] and had to change the date - pt actually moved up to 9/6.  I called [employer] gave new date to [place].  Still waiting for wife to return my call.

## 2021-08-23 NOTE — Telephone Encounter (Signed)
I have scheduled cpap titration study for 9/15 at [facility].  I called pt's wife & left a vm for her to call me for appt info.  I also called phone # listed as home # and was [facility] where pt resides in nursing home.  I gave them appt info for pt.

## 2021-08-24 NOTE — Progress Notes (Signed)
Introductory phone call not placed as patient is a resident of [facility]. Nursing facility has my contact information should any needs arise.

## 2021-08-24 NOTE — Telephone Encounter (Signed)
Spoke to pt's wife and gave her appt info.  Nothing further needed. 

## 2021-08-28 ENCOUNTER — Inpatient Hospital Stay: Payer: Medicare PPO | Attending: Hematology | Admitting: Hematology

## 2021-08-28 ENCOUNTER — Encounter: Payer: Self-pay | Admitting: Hematology

## 2021-08-28 ENCOUNTER — Inpatient Hospital Stay: Payer: Medicare PPO

## 2021-08-28 DIAGNOSIS — K219 Gastro-esophageal reflux disease without esophagitis: Secondary | ICD-10-CM | POA: Insufficient documentation

## 2021-08-28 DIAGNOSIS — Z886 Allergy status to analgesic agent status: Secondary | ICD-10-CM | POA: Diagnosis not present

## 2021-08-28 DIAGNOSIS — N319 Neuromuscular dysfunction of bladder, unspecified: Secondary | ICD-10-CM | POA: Diagnosis not present

## 2021-08-28 DIAGNOSIS — Z993 Dependence on wheelchair: Secondary | ICD-10-CM | POA: Insufficient documentation

## 2021-08-28 DIAGNOSIS — N2889 Other specified disorders of kidney and ureter: Secondary | ICD-10-CM | POA: Insufficient documentation

## 2021-08-28 DIAGNOSIS — Z79899 Other long term (current) drug therapy: Secondary | ICD-10-CM | POA: Diagnosis not present

## 2021-08-28 DIAGNOSIS — Z931 Gastrostomy status: Secondary | ICD-10-CM | POA: Insufficient documentation

## 2021-08-28 DIAGNOSIS — E1122 Type 2 diabetes mellitus with diabetic chronic kidney disease: Secondary | ICD-10-CM | POA: Diagnosis not present

## 2021-08-28 DIAGNOSIS — I252 Old myocardial infarction: Secondary | ICD-10-CM | POA: Insufficient documentation

## 2021-08-28 DIAGNOSIS — E785 Hyperlipidemia, unspecified: Secondary | ICD-10-CM | POA: Insufficient documentation

## 2021-08-28 DIAGNOSIS — I129 Hypertensive chronic kidney disease with stage 1 through stage 4 chronic kidney disease, or unspecified chronic kidney disease: Secondary | ICD-10-CM | POA: Insufficient documentation

## 2021-08-28 DIAGNOSIS — G4733 Obstructive sleep apnea (adult) (pediatric): Secondary | ICD-10-CM | POA: Insufficient documentation

## 2021-08-28 DIAGNOSIS — N183 Chronic kidney disease, stage 3 unspecified: Secondary | ICD-10-CM | POA: Insufficient documentation

## 2021-08-28 DIAGNOSIS — R918 Other nonspecific abnormal finding of lung field: Secondary | ICD-10-CM | POA: Insufficient documentation

## 2021-08-28 DIAGNOSIS — I700 Atherosclerosis of aorta: Secondary | ICD-10-CM | POA: Insufficient documentation

## 2021-08-28 DIAGNOSIS — I480 Paroxysmal atrial fibrillation: Secondary | ICD-10-CM | POA: Insufficient documentation

## 2021-08-28 DIAGNOSIS — Z8673 Personal history of transient ischemic attack (TIA), and cerebral infarction without residual deficits: Secondary | ICD-10-CM | POA: Insufficient documentation

## 2021-08-28 LAB — CBC WITH DIFFERENTIAL/PLATELET
Abs Immature Granulocytes: 0.04 10*3/uL (ref 0.00–0.07)
Basophils Absolute: 0.1 10*3/uL (ref 0.0–0.1)
Basophils Relative: 1 %
Eosinophils Absolute: 0.9 10*3/uL — ABNORMAL HIGH (ref 0.0–0.5)
Eosinophils Relative: 11 %
HCT: 40.4 % (ref 39.0–52.0)
Hemoglobin: 13.2 g/dL (ref 13.0–17.0)
Immature Granulocytes: 1 %
Lymphocytes Relative: 22 %
Lymphs Abs: 1.9 10*3/uL (ref 0.7–4.0)
MCH: 28.9 pg (ref 26.0–34.0)
MCHC: 32.7 g/dL (ref 30.0–36.0)
MCV: 88.4 fL (ref 80.0–100.0)
Monocytes Absolute: 0.7 10*3/uL (ref 0.1–1.0)
Monocytes Relative: 8 %
Neutro Abs: 5 10*3/uL (ref 1.7–7.7)
Neutrophils Relative %: 57 %
Platelets: 281 10*3/uL (ref 150–400)
RBC: 4.57 MIL/uL (ref 4.22–5.81)
RDW: 14.9 % (ref 11.5–15.5)
WBC: 8.6 10*3/uL (ref 4.0–10.5)
nRBC: 0 % (ref 0.0–0.2)

## 2021-08-28 LAB — COMPREHENSIVE METABOLIC PANEL
ALT: 11 U/L (ref 0–44)
AST: 14 U/L — ABNORMAL LOW (ref 15–41)
Albumin: 3.3 g/dL — ABNORMAL LOW (ref 3.5–5.0)
Alkaline Phosphatase: 140 U/L — ABNORMAL HIGH (ref 38–126)
Anion gap: 6 (ref 5–15)
BUN: 29 mg/dL — ABNORMAL HIGH (ref 8–23)
CO2: 29 mmol/L (ref 22–32)
Calcium: 9.5 mg/dL (ref 8.9–10.3)
Chloride: 101 mmol/L (ref 98–111)
Creatinine, Ser: 1.5 mg/dL — ABNORMAL HIGH (ref 0.61–1.24)
GFR, Estimated: 47 mL/min — ABNORMAL LOW (ref >60)
Glucose, Bld: 104 mg/dL — ABNORMAL HIGH (ref 70–99)
Potassium: 3.9 mmol/L (ref 3.5–5.1)
Sodium: 136 mmol/L (ref 135–145)
Total Bilirubin: 0.6 mg/dL (ref 0.3–1.2)
Total Protein: 7.2 g/dL (ref 6.5–8.1)

## 2021-08-28 LAB — LACTATE DEHYDROGENASE: LDH: 136 U/L (ref 98–192)

## 2021-08-28 NOTE — Patient Instructions (Addendum)
[place] at [facility] Discharge Instructions   You were seen and examined today by Dr. Delton Coombes. He is an oncologist, a doctor that treats cancer. He is seeing you today for evaluation masses seen on your kidneys on a recent CT scan. These spots are suspicious for renal cell carcinoma.   He discussed doing further imaging. We will do a PET scan to see if this is cancer and if it is, if it has spread to any other organs or your bones.  We will do blood work today.  Return as scheduled.    Thank you for choosing [place] at [facility] to provide your oncology and hematology care.  To afford each patient quality time with our provider, please arrive at least 15 minutes before your scheduled appointment time.   If you have a lab appointment with the [facility] please come in thru the Main Entrance and check in at the main information desk.  You need to re-schedule your appointment should you arrive 10 or more minutes late.  We strive to give you quality time with our providers, and arriving late affects you and other patients whose appointments are after yours.  Also, if you no show three or more times for appointments you may be dismissed from the clinic at the providers discretion.     Again, thank you for choosing [facility].  Our hope is that these requests will decrease the amount of time that you wait before being seen by our physicians.       _____________________________________________________________  Should you have questions after your visit to [facility], please contact our office at [phone] and follow the prompts.  Our office hours are 8:00 a.m. and 4:30 p.m. Monday - Friday.  Please note that voicemails left after 4:00 p.m. may not be returned until the following business day.  We are closed weekends and major holidays.  You do have access to a nurse 24-7, just call the main number to the  clinic [phone] and do not press any options, hold on the line and a nurse will answer the phone.    For prescription refill requests, have your pharmacy contact our office and allow 72 hours.    Due to Covid, you will need to wear a mask upon entering the hospital. If you do not have a mask, a mask will be given to you at the Main Entrance upon arrival. For doctor visits, patients may have 1 support person age 55 or older with them. For treatment visits, patients can not have anyone with them due to social distancing guidelines and our immunocompromised population.

## 2021-08-28 NOTE — Telephone Encounter (Signed)
Teams message sent to the PCCs as I do not see anything in the media tab in his chart and have not seen a fax in TP's paperwork.  Will await response from PCCs.

## 2021-08-28 NOTE — Progress Notes (Signed)
[facility] NOTE  Patient Care Team: Alvira Monday, Bartholomew as PCP - General (Family Medicine) Derek Jack, MD as Medical Oncologist (Medical Oncology) Brien Mates, RN as Oncology Nurse Navigator (Medical Oncology)  CHIEF COMPLAINTS/PURPOSE OF CONSULTATION:  Bilateral renal masses.  HISTORY OF PRESENTING ILLNESS:  Dylan Martin 76 y.o. male is seen in consultation today at the request of Dr. Felipa Eth for bilateral kidney masses.  Patient provides limited history due to his stroke.  He is currently a resident at [facility].  As per the notes, he has pressure ulcers on the right and left heels.  He has a feeding tube and also has suprapubic catheter.  Patient thinks that he might have lost less than 5 pounds in the last few months.  He does not report any new onset pains.  I have reviewed records from the nursing home.  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[place])    Aphasia following cerebral infarction    Atherosclerotic heart disease of native coronary artery without angina pectoris    Benign prostatic hyperplasia with lower urinary tract symptoms    Benign prostatic hyperplasia without lower urinary tract symptoms    Bradycardia, unspecified    Cerebral infarction (HCC)    Chronic kidney disease, stage 3 unspecified (HCC)    Cognitive communication deficit    Dysarthria    Dysphagia    Encephalopathy    Essential (primary) hypertension    Gastro-esophageal reflux disease without esophagitis    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HCC)    Hemiplegia (HCC)    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)    Hyperlipidemia    Hyperosmolality and hypernatremia    Muscle weakness (generalized)    Myocardial infarct, old    Neuromuscular dysfunction of bladder, unspecified    Nontraumatic intracerebral hemorrhage (HCC)     Nontraumatic intracerebral hemorrhage (HCC)    Obstructive sleep apnea    Paroxysmal atrial fibrillation (HCC)    Presence of cardiac pacemaker    Presence of coronary angioplasty implant and graft    Pressure ulcer    Renovascular hypertension    Retention of urine    Seizures (HCC)    TIA (transient ischemic attack)    Type 2 diabetes mellitus without complication (HCC)    Unspecified atrial fibrillation (HCC)    Unspecified disorder of synovium and tendon, right shoulder    Unspecified lack of coordination    Weakness     SURGICAL HISTORY: Past Surgical History:  Procedure Laterality Date   FEMUR SURGERY Left    HIP SURGERY Left    SPINAL CORD STIMULATOR IMPLANT     per wife nerve stimulator in back    SOCIAL HISTORY: Social History   Socioeconomic History   Marital status: Married    Spouse name: Not on file   Number of children: Not on file   Years of education: Not on file   Highest education level: Not on file  Occupational History   Not on file  Tobacco Use   Smoking status: Never   Smokeless tobacco: Never  Vaping Use   Vaping Use: Never used  Substance and Sexual Activity   Alcohol use: Not Currently   Drug use: Never   Sexual activity: Not on file  Other Topics Concern   Not on file  Social History Narrative   Not on file   Social Determinants of Health  Financial Resource Strain: Not on file  Food Insecurity: Not on file  Transportation Needs: Not on file  Physical Activity: Not on file  Stress: Not on file  Social Connections: Not on file  Intimate Partner Violence: Not on file    FAMILY HISTORY: History reviewed. No pertinent family history.  ALLERGIES:  is allergic to shellfish allergy and aspirin.  MEDICATIONS:  Current Outpatient Medications  Medication Sig Dispense Refill   Accu-Chek Softclix Lancets lancets      acetaminophen (TYLENOL) 325 MG tablet Take by mouth.     amLODipine (NORVASC) 10 MG tablet Take by mouth.      Ascorbic Acid 500 MG/5ML LIQD  500 mg = 5 mL, PEG Tube, Daily, # 150 mL, 0 Refill(s)     atorvastatin (LIPITOR) 80 MG tablet Take by mouth.     bisacodyl (DULCOLAX) 10 MG suppository  10 mg = 1 supp, Rectally, Daily, for constipation, # 10 supp, 0 Refill(s)     cyclobenzaprine (FLEXERIL) 5 MG tablet Take by mouth.     fexofenadine (ALLEGRA ODT) 30 MG disintegrating tablet Take by mouth.     FLUoxetine (PROZAC) 20 MG capsule Take by mouth.     hydrALAZINE (APRESOLINE) 25 MG tablet Take by mouth.     lisinopril (ZESTRIL) 40 MG tablet Take by mouth.     loperamide (IMODIUM A-D) 2 MG tablet Take 2 mg by mouth 4 (four) times daily as needed for diarrhea or loose stools.     melatonin 3 MG TABS tablet  3 mg = 1 tab(s), PEG Tube, QHS, for insomnia, # 60 tab(s), 0 Refill(s)     metoprolol succinate (TOPROL-XL) 50 MG 24 hr tablet Take 50 mg by mouth 2 (two) times daily.     pantoprazole sodium (PROTONIX) 40 mg Take by mouth.     sodium bicarbonate 650 MG tablet  1,950 mg = 3 tab(s), PEG Tube, Daily, # 60 tab(s), 0 Refill(s)     thiamine 100 MG tablet  100 mg = 1 tab(s), PEG Tube, Daily, 0 Refill(s)     traZODone (DESYREL) 50 MG tablet Take 50 mg by mouth at bedtime.     ZINC SULFATE PO Take by mouth.     nitroGLYCERIN (NITROSTAT) 0.4 MG SL tablet Place under the tongue. (Patient not taking: Reported on 08/28/2021)     ondansetron (ZOFRAN) 4 MG tablet Take by mouth. (Patient not taking: Reported on 08/28/2021)     No current facility-administered medications for this visit.    REVIEW OF SYSTEMS:   Constitutional: Denies fevers, chills or abnormal night sweats Eyes: Denies blurriness of vision, double vision or watery eyes Ears, nose, mouth, throat, and face: Denies mucositis or sore throat Respiratory: Denies cough, dyspnea or wheezes Cardiovascular: Denies palpitation, chest discomfort or lower extremity swelling Gastrointestinal:  Denies nausea, heartburn or change in bowel habits Skin: Denies  abnormal skin rashes Lymphatics: Denies new lymphadenopathy or easy bruising Neurological:Denies numbness, tingling or new weaknesses Behavioral/Psych: Mood is stable, no new changes  All other systems were reviewed with the patient and are negative.  PHYSICAL EXAMINATION: ECOG PERFORMANCE STATUS: 3 - Symptomatic, >50% confined to bed  Vitals:   08/28/21 0817  BP: 105/67  Pulse: 64  Resp: 18  Temp: (!) 97.2 F (36.2 C)  SpO2: 99%   There were no vitals filed for this visit.  GENERAL:alert, no distress and comfortable SKIN: skin color, texture, turgor are normal, no rashes or significant lesions EYES: normal, conjunctiva are pink and  non-injected, sclera clear OROPHARYNX:no exudate, no erythema and lips, buccal mucosa, and tongue normal  NECK: supple, thyroid normal size, non-tender, without nodularity LYMPH:  no palpable lymphadenopathy in the cervical, axillary or inguinal LUNGS: Bilaterally clear to auscultation. HEART: S1-S2, irregular rate. ABDOMEN: PEG tube in the upper quadrant. Musculoskeletal:no cyanosis of digits and no clubbing  PSYCH: alert & oriented x 3. NEURO: Right upper extremity hemiparesis.  LABORATORY DATA:  I have reviewed the data as listed Lab Results  Component Value Date   WBC 8.6 08/28/2021   HGB 13.2 08/28/2021   HCT 40.4 08/28/2021   MCV 88.4 08/28/2021   PLT 281 08/28/2021     Chemistry      Component Value Date/Time   NA 136 08/28/2021 0914   NA 140 07/16/2021 0818   K 3.9 08/28/2021 0914   CL 101 08/28/2021 0914   CO2 29 08/28/2021 0914   BUN 29 (H) 08/28/2021 0914   BUN 11 07/16/2021 0818   CREATININE 1.50 (H) 08/28/2021 0914      Component Value Date/Time   CALCIUM 9.5 08/28/2021 0914   ALKPHOS 140 (H) 08/28/2021 0914   AST 14 (L) 08/28/2021 0914   ALT 11 08/28/2021 0914   BILITOT 0.6 08/28/2021 0914   BILITOT 0.6 07/16/2021 0818       RADIOGRAPHIC STUDIES: I have personally reviewed the radiological images as listed  and agreed with the findings in the report. CT CHEST WO CONTRAST  Result Date: 08/11/2021 CLINICAL DATA:  Bilateral renal masses.  * Tracking Code: BO * EXAM: CT CHEST WITHOUT CONTRAST CT ABDOMEN WITHOUT AND WITH CONTRAST TECHNIQUE: Multidetector CT imaging of the chest and abdomen was performed without intravenous contrast. Multidetector CT imaging of the abdomen was then performed during bolus administration of intravenous contrast. RADIATION DOSE REDUCTION: This exam was performed according to the departmental dose-optimization program which includes automated exposure control, adjustment of the mA and/or kV according to patient size and/or use of iterative reconstruction technique. CONTRAST:  151m OMNIPAQUE IOHEXOL 300 MG/ML  SOLN COMPARISON:  None Available. FINDINGS: CT CHEST WITHOUT CONTRAST Cardiovascular: The heart size is normal. No substantial pericardial effusion. Coronary artery calcification is evident. Mild atherosclerotic calcification is noted in the wall of the thoracic aorta. Left-sided permanent pacemaker noted. Mediastinum/Nodes: No mediastinal lymphadenopathy. No evidence for gross hilar lymphadenopathy although assessment is limited by the lack of intravenous contrast on the current study. The esophagus has normal imaging features. There is no axillary lymphadenopathy. Lungs/Pleura: No suspicious pulmonary nodule or mass in the right lung. 10 x 6 mm nodule identified in the left lower lobe on image 73/4. Tiny calcified granulomata noted in the lung bases bilaterally. Dependent compressive atelectasis noted in both lungs. No pleural effusion. Musculoskeletal: No worrisome lytic or sclerotic osseous abnormality. CT ABDOMEN WITHOUT AND WITH CONTRAST Hepatobiliary: No suspicious focal abnormality within the liver parenchyma. There is no evidence for gallstones, gallbladder wall thickening, or pericholecystic fluid. No intrahepatic or extrahepatic biliary dilation. Pancreas: No focal mass  lesion. No dilatation of the main duct. No intraparenchymal cyst. No peripancreatic edema. Spleen: No splenomegaly. No focal mass lesion. Adrenals/Urinary Tract: No adrenal nodule or mass. Precontrast imaging shows no stones in either kidney. Imaging after IV contrast administration reveals the presence of a heterogeneously enhancing 4.1 x 4.0 x 4.6 cm mass in the posterior lower interpolar right kidney. Mass lesion extends into the central sinus fat. No filling defect in the right renal vein. There is a second tiny enhancing lesion in the  anterior lower pole right kidney measuring 8 mm on image 66/12 with washout beyond background renal cortex on axial image 64/13. 2.1 x 2.3 x 2.0 cm heterogeneously enhancing mass is identified in the posterior interpolar left kidney. Cortical scarring noted lower pole left kidney. Scattered tiny hypoattenuating lesions in the left kidney are too small to characterize but may be tiny cortical cyst. Delayed post-contrast imaging shows no wall thickening or soft tissue filling defect in either intrarenal collecting system or renal pelvis. Stomach/Bowel: Gastrostomy tube noted. No gastric wall thickening. No evidence of outlet obstruction. Duodenum is normally positioned as is the ligament of Treitz. No small bowel or colonic dilatation within the visualized abdomen. Diverticular changes are noted within the visualized colon. Vascular/Lymphatic: There is moderate atherosclerotic calcification of the abdominal aorta without aneurysm. There is no gastrohepatic or hepatoduodenal ligament lymphadenopathy. No retroperitoneal or mesenteric lymphadenopathy. Other: No intraperitoneal free fluid. Musculoskeletal: No worrisome lytic or sclerotic osseous abnormality. Thoracic spinal stimulator device evident. IMPRESSION: 1. 4.6 cm heterogeneously enhancing mass in the posterior lower interpolar right kidney, consistent with renal cell carcinoma. Mass lesion extends into the central sinus fat. No  filling defect in the right renal vein. 2. 8 mm enhancing lesion in the anterior lower pole right kidney . Imaging features consistent highly suspicious for a second right renal neoplasm. 3. 2.3 x 2.3 x 2.0 cm heterogeneously enhancing mass in the posterior interpolar left kidney, consistent with renal cell carcinoma. 4. 10 x 6 mm left lower lobe pulmonary nodule. While nonspecific, primary bronchogenic neoplasm or metastatic disease are both within the differential consideration. 5. Aortic Atherosclerosis (ICD10-I70.0). Electronically Signed   By: Misty Stanley M.D.   On: 08/11/2021 11:30   CT ABDOMEN W WO CONTRAST  Result Date: 08/11/2021 CLINICAL DATA:  Bilateral renal masses.  * Tracking Code: BO * EXAM: CT CHEST WITHOUT CONTRAST CT ABDOMEN WITHOUT AND WITH CONTRAST TECHNIQUE: Multidetector CT imaging of the chest and abdomen was performed without intravenous contrast. Multidetector CT imaging of the abdomen was then performed during bolus administration of intravenous contrast. RADIATION DOSE REDUCTION: This exam was performed according to the departmental dose-optimization program which includes automated exposure control, adjustment of the mA and/or kV according to patient size and/or use of iterative reconstruction technique. CONTRAST:  124m OMNIPAQUE IOHEXOL 300 MG/ML  SOLN COMPARISON:  None Available. FINDINGS: CT CHEST WITHOUT CONTRAST Cardiovascular: The heart size is normal. No substantial pericardial effusion. Coronary artery calcification is evident. Mild atherosclerotic calcification is noted in the wall of the thoracic aorta. Left-sided permanent pacemaker noted. Mediastinum/Nodes: No mediastinal lymphadenopathy. No evidence for gross hilar lymphadenopathy although assessment is limited by the lack of intravenous contrast on the current study. The esophagus has normal imaging features. There is no axillary lymphadenopathy. Lungs/Pleura: No suspicious pulmonary nodule or mass in the right lung.  10 x 6 mm nodule identified in the left lower lobe on image 73/4. Tiny calcified granulomata noted in the lung bases bilaterally. Dependent compressive atelectasis noted in both lungs. No pleural effusion. Musculoskeletal: No worrisome lytic or sclerotic osseous abnormality. CT ABDOMEN WITHOUT AND WITH CONTRAST Hepatobiliary: No suspicious focal abnormality within the liver parenchyma. There is no evidence for gallstones, gallbladder wall thickening, or pericholecystic fluid. No intrahepatic or extrahepatic biliary dilation. Pancreas: No focal mass lesion. No dilatation of the main duct. No intraparenchymal cyst. No peripancreatic edema. Spleen: No splenomegaly. No focal mass lesion. Adrenals/Urinary Tract: No adrenal nodule or mass. Precontrast imaging shows no stones in either kidney. Imaging after IV  contrast administration reveals the presence of a heterogeneously enhancing 4.1 x 4.0 x 4.6 cm mass in the posterior lower interpolar right kidney. Mass lesion extends into the central sinus fat. No filling defect in the right renal vein. There is a second tiny enhancing lesion in the anterior lower pole right kidney measuring 8 mm on image 66/12 with washout beyond background renal cortex on axial image 64/13. 2.1 x 2.3 x 2.0 cm heterogeneously enhancing mass is identified in the posterior interpolar left kidney. Cortical scarring noted lower pole left kidney. Scattered tiny hypoattenuating lesions in the left kidney are too small to characterize but may be tiny cortical cyst. Delayed post-contrast imaging shows no wall thickening or soft tissue filling defect in either intrarenal collecting system or renal pelvis. Stomach/Bowel: Gastrostomy tube noted. No gastric wall thickening. No evidence of outlet obstruction. Duodenum is normally positioned as is the ligament of Treitz. No small bowel or colonic dilatation within the visualized abdomen. Diverticular changes are noted within the visualized colon.  Vascular/Lymphatic: There is moderate atherosclerotic calcification of the abdominal aorta without aneurysm. There is no gastrohepatic or hepatoduodenal ligament lymphadenopathy. No retroperitoneal or mesenteric lymphadenopathy. Other: No intraperitoneal free fluid. Musculoskeletal: No worrisome lytic or sclerotic osseous abnormality. Thoracic spinal stimulator device evident. IMPRESSION: 1. 4.6 cm heterogeneously enhancing mass in the posterior lower interpolar right kidney, consistent with renal cell carcinoma. Mass lesion extends into the central sinus fat. No filling defect in the right renal vein. 2. 8 mm enhancing lesion in the anterior lower pole right kidney . Imaging features consistent highly suspicious for a second right renal neoplasm. 3. 2.3 x 2.3 x 2.0 cm heterogeneously enhancing mass in the posterior interpolar left kidney, consistent with renal cell carcinoma. 4. 10 x 6 mm left lower lobe pulmonary nodule. While nonspecific, primary bronchogenic neoplasm or metastatic disease are both within the differential consideration. 5. Aortic Atherosclerosis (ICD10-I70.0). Electronically Signed   By: Misty Stanley M.D.   On: 08/11/2021 11:30   CUP PACEART REMOTE DEVICE CHECK  Result Date: 08/09/2021 Scheduled remote reviewed. Normal device function.  Next remote 91 days. Kathy Breach, RN, CCDS, [employer]   ASSESSMENT:  1.  Bilateral kidney masses: - Patient seen at the request of Dr. Felipa Eth for further evaluation of kidney masses. - CT abdomen with and without contrast (08/09/2021): 4.1 x 4 x 4.6 cm mass in the posterior lower interpolar right kidney, extending into the central sinus fat.  Second tiny enhancing lesion in the anterior lower pole of the right kidney measuring 8 mm.  2.1 x 2.3 x 2 cm heterogeneously enhancing mass in the posterior interpolar left kidney.  10 x 6 mm left lower lobe lung nodule nonspecific. - Dr. Felipa Eth did not think he was a surgical candidate.  2.   Social/family history: - Patient resident at [facility] home since 05/04/2021.  He requires full care.  He had a history of hemorrhagic stroke and neurogenic bladder.  His wife visits him at the nursing home per the attendant.  He used to build homes prior to retirement.  Non-smoker. - Family history consistent with brother with cancer.  PLAN:  1.  Bilateral kidney masses: - Radiologically they are consistent with renal cell carcinoma.  There was a question of L1 lytic lesion on previous scans from Dr. Carlyle Lipa note.  He is not a surgical candidate per Dr. Felipa Eth. - He is not a candidate for any aggressive systemic therapy even if RCC is confirmed. - I  have recommended doing a PET CT scan for further staging. - We will also check his CBC, CMP and LDH. - I have recommended that his wife accompany him at next visit.   Orders Placed This Encounter  Procedures   NM PET Image Initial (PI) Skull Base To Thigh    Standing Status:   Future    Standing Expiration Date:   08/28/2022    Order Specific Question:   If indicated for the ordered procedure, I authorize the administration of a radiopharmaceutical per Radiology protocol    Answer:   Yes    Order Specific Question:   Preferred imaging location?    Answer:   [facility]    Order Specific Question:   Release to patient    Answer:   Immediate   CBC with Differential    Standing Status:   Future    Number of Occurrences:   1    Standing Expiration Date:   08/28/2022   Comprehensive metabolic panel    Standing Status:   Future    Number of Occurrences:   1    Standing Expiration Date:   08/28/2022   Lactate dehydrogenase    Standing Status:   Future    Number of Occurrences:   1    Standing Expiration Date:   08/28/2022    All questions were answered. The patient knows to call the clinic with any problems, questions or concerns.     Derek Jack, MD 08/28/2021 1:05 PM

## 2021-08-31 ENCOUNTER — Other Ambulatory Visit: Payer: Self-pay | Admitting: Internal Medicine

## 2021-09-03 NOTE — Telephone Encounter (Signed)
Heather,  Yes I went to look in Tps box here in [place] and I have the fax in my hand right now. It says it went through on 08/08/21.  What do you want me to do with it? I never go the sleep study though. I just have the request.  Chapel Silverthorn RN

## 2021-09-03 NOTE — Progress Notes (Signed)
Remote pacemaker transmission.   

## 2021-09-03 NOTE — Telephone Encounter (Signed)
April, Would you mind faxing the request again since we did not receive anything and then put it in her cabinet please?

## 2021-09-03 NOTE — Telephone Encounter (Signed)
Refaxed requested for HST. Nothing further needed

## 2021-09-03 NOTE — Telephone Encounter (Signed)
April, Do you still have the request for the sleep study?  I cannot find a fax anywhere.  Thanks.

## 2021-09-07 ENCOUNTER — Ambulatory Visit (HOSPITAL_COMMUNITY)
Admission: RE | Admit: 2021-09-07 | Discharge: 2021-09-07 | Disposition: A | Discharge status: Home / Self Care | Payer: Medicare PPO | Source: Ambulatory Visit | Attending: Hematology | Admitting: Hematology

## 2021-09-07 DIAGNOSIS — N2889 Other specified disorders of kidney and ureter: Secondary | ICD-10-CM | POA: Diagnosis present

## 2021-09-07 DIAGNOSIS — Z85528 Personal history of other malignant neoplasm of kidney: Secondary | ICD-10-CM | POA: Insufficient documentation

## 2021-09-07 DIAGNOSIS — I700 Atherosclerosis of aorta: Secondary | ICD-10-CM | POA: Insufficient documentation

## 2021-09-07 DIAGNOSIS — C641 Malignant neoplasm of right kidney, except renal pelvis: Secondary | ICD-10-CM | POA: Diagnosis not present

## 2021-09-07 DIAGNOSIS — R911 Solitary pulmonary nodule: Secondary | ICD-10-CM | POA: Diagnosis not present

## 2021-09-07 DIAGNOSIS — Z8673 Personal history of transient ischemic attack (TIA), and cerebral infarction without residual deficits: Secondary | ICD-10-CM | POA: Insufficient documentation

## 2021-09-07 DIAGNOSIS — I2510 Atherosclerotic heart disease of native coronary artery without angina pectoris: Secondary | ICD-10-CM | POA: Diagnosis not present

## 2021-09-07 LAB — GLUCOSE, CAPILLARY: Glucose-Capillary: 110 mg/dL — ABNORMAL HIGH (ref 70–99)

## 2021-09-07 MED ORDER — FLUDEOXYGLUCOSE F - 18 (FDG) INJECTION
7.6500 | Freq: Once | INTRAVENOUS | Status: AC
  Administered 2021-09-07: 7.65 via INTRAVENOUS

## 2021-09-10 ENCOUNTER — Ambulatory Visit: Payer: Medicare PPO | Admitting: Podiatry

## 2021-09-12 ENCOUNTER — Inpatient Hospital Stay (HOSPITAL_BASED_OUTPATIENT_CLINIC_OR_DEPARTMENT_OTHER): Payer: Medicare PPO | Admitting: Hematology

## 2021-09-12 ENCOUNTER — Encounter: Payer: Self-pay | Admitting: Hematology

## 2021-09-12 VITALS — BP 118/24 | HR 66 | Temp 97.8°F | Resp 18 | Wt 157.0 lb

## 2021-09-12 DIAGNOSIS — N2889 Other specified disorders of kidney and ureter: Secondary | ICD-10-CM | POA: Diagnosis not present

## 2021-09-12 NOTE — Patient Instructions (Addendum)
Dylan Martin at [facility] Discharge Instructions  You were seen and examined today by Dr. Delton Coombes.  Dr. Delton Coombes discussed your PET scan results which revealed a mass in your right kidney that is concerning for Renal Cell Carcinoma (cancer arising in your kidney). Dr. Felipa Eth does not believe that he is a surgical candidate. Given your current condition, Dr. Delton Coombes has recommended a repeat CT scan in 6 months. If you are more mobile and stronger in 6 months, another cryoablation may be discussed.  It appears to be a Stage I cancer but the prognosis and aggressiveness of the cancer cannot be determined without a biopsy.   Follow-up as scheduled.  Thank you for choosing [place] at [facility] to provide your oncology and hematology care.  To afford each patient quality time with our provider, please arrive at least 15 minutes before your scheduled appointment time.   If you have a lab appointment with the [facility] please come in thru the Main Entrance and check in at the main information desk.  You need to re-schedule your appointment should you arrive 10 or more minutes late.  We strive to give you quality time with our providers, and arriving late affects you and other patients whose appointments are after yours.  Also, if you no show three or more times for appointments you may be dismissed from the clinic at the providers discretion.     Again, thank you for choosing [facility].  Our hope is that these requests will decrease the amount of time that you wait before being seen by our physicians.       _____________________________________________________________  Should you have questions after your visit to [facility], please contact our office at [phone] and follow the prompts.  Our office hours are 8:00 a.m. and 4:30 p.m. Monday - Friday.  Please note that voicemails left after 4:00 p.m. may not  be returned until the following business day.  We are closed weekends and major holidays.  You do have access to a nurse 24-7, just call the main number to the clinic [phone] and do not press any options, hold on the line and a nurse will answer the phone.    For prescription refill requests, have your pharmacy contact our office and allow 72 hours.

## 2021-09-12 NOTE — Progress Notes (Signed)
[place], [place] [address]   CLINIC:  Medical Oncology/Hematology  PCP:  Alvira Monday, [address] [place] [address] [phone]   REASON FOR VISIT:  Follow-up for bilateral kidney masses.  PRIOR THERAPY:?  Left kidney mass ablation few years ago  NGS Results: Not done  CURRENT THERAPY: Observation  BRIEF ONCOLOGIC HISTORY:  Oncology History   No history exists.    CANCER STAGING: Cancer Staging  No matching staging information was found for the patient.   INTERVAL HISTORY:  Mr. Deguire 76 y.o. male returns for follow-up of bilateral kidney masses.  He is wheelchair-bound and resides at a local nursing home because of stroke and immobility.  Patient's wife reports that he is eating by mouth and has not been using PEG tube.    REVIEW OF SYSTEMS:  Review of Systems  All other systems reviewed and are negative.    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[place])    Aphasia following cerebral infarction    Atherosclerotic heart disease of native coronary artery without angina pectoris    Benign prostatic hyperplasia with lower urinary tract symptoms    Benign prostatic hyperplasia without lower urinary tract symptoms    Bradycardia, unspecified    Cerebral infarction (HCC)    Chronic kidney disease, stage 3 unspecified (HCC)    Cognitive communication deficit    Dysarthria    Dysphagia    Encephalopathy    Essential (primary) hypertension    Gastro-esophageal reflux disease without esophagitis    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HCC)    Hemiplegia (HCC)    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)    Hyperlipidemia    Hyperosmolality and hypernatremia    Muscle weakness (generalized)    Myocardial infarct, old    Neuromuscular dysfunction of bladder, unspecified    Nontraumatic  intracerebral hemorrhage (HCC)    Nontraumatic intracerebral hemorrhage (HCC)    Obstructive sleep apnea    Paroxysmal atrial fibrillation (HCC)    Presence of cardiac pacemaker    Presence of coronary angioplasty implant and graft    Pressure ulcer    Renovascular hypertension    Retention of urine    Seizures (HCC)    TIA (transient ischemic attack)    Type 2 diabetes mellitus without complication (HCC)    Unspecified atrial fibrillation (HCC)    Unspecified disorder of synovium and tendon, right shoulder    Unspecified lack of coordination    Weakness    Past Surgical History:  Procedure Laterality Date   FEMUR SURGERY Left    HIP SURGERY Left    SPINAL CORD STIMULATOR IMPLANT     per wife nerve stimulator in back     SOCIAL HISTORY:  Social History   Socioeconomic History   Marital status: Married    Spouse name: Not on file   Number of children: Not on file   Years of education: Not on file   Highest education level: Not on file  Occupational History   Not on file  Tobacco Use   Smoking status: Never   Smokeless tobacco: Never  Vaping Use   Vaping Use: Never used  Substance and Sexual Activity   Alcohol use: Not Currently   Drug use: Never   Sexual activity: Not on file  Other Topics Concern   Not on file  Social History Narrative  Not on file   Social Determinants of Health   Financial Resource Strain: Not on file  Food Insecurity: Not on file  Transportation Needs: Not on file  Physical Activity: Not on file  Stress: Not on file  Social Connections: Not on file  Intimate Partner Violence: Not on file    FAMILY HISTORY:  History reviewed. No pertinent family history.  CURRENT MEDICATIONS:  Outpatient Encounter Medications as of 09/12/2021  Medication Sig   Accu-Chek Softclix Lancets lancets    acetaminophen (TYLENOL) 325 MG tablet Take by mouth.   amLODipine (NORVASC) 10 MG tablet Take by mouth.   Ascorbic Acid 500 MG/5ML LIQD  500 mg = 5  mL, PEG Tube, Daily, # 150 mL, 0 Refill(s)   atorvastatin (LIPITOR) 80 MG tablet Take by mouth.   bisacodyl (DULCOLAX) 10 MG suppository  10 mg = 1 supp, Rectally, Daily, for constipation, # 10 supp, 0 Refill(s)   cyclobenzaprine (FLEXERIL) 5 MG tablet Take by mouth.   fexofenadine (ALLEGRA ODT) 30 MG disintegrating tablet Take by mouth.   FLUoxetine (PROZAC) 20 MG capsule Take by mouth.   hydrALAZINE (APRESOLINE) 25 MG tablet Take by mouth.   lisinopril (ZESTRIL) 40 MG tablet Take by mouth.   loperamide (IMODIUM A-D) 2 MG tablet Take 2 mg by mouth 4 (four) times daily as needed for diarrhea or loose stools.   melatonin 3 MG TABS tablet  3 mg = 1 tab(s), PEG Tube, QHS, for insomnia, # 60 tab(s), 0 Refill(s)   metoprolol succinate (TOPROL-XL) 50 MG 24 hr tablet Take 50 mg by mouth 2 (two) times daily.   nitroGLYCERIN (NITROSTAT) 0.4 MG SL tablet Place under the tongue.   ondansetron (ZOFRAN) 4 MG tablet Take by mouth.   pantoprazole sodium (PROTONIX) 40 mg Take by mouth.   sodium bicarbonate 650 MG tablet  1,950 mg = 3 tab(s), PEG Tube, Daily, # 60 tab(s), 0 Refill(s)   thiamine 100 MG tablet  100 mg = 1 tab(s), PEG Tube, Daily, 0 Refill(s)   traZODone (DESYREL) 50 MG tablet Take 50 mg by mouth at bedtime.   ZINC SULFATE PO Take by mouth.   No facility-administered encounter medications on file as of 09/12/2021.    ALLERGIES:  Allergies  Allergen Reactions   Shellfish Allergy    Aspirin Other (See Comments)    dr told pt not to take ASA after aneurysm     PHYSICAL EXAM:  ECOG Performance status: 3  Vitals:   09/12/21 1346  BP: (!) 118/24  Pulse: 66  Resp: 18  Temp: 97.8 F (36.6 C)  SpO2: 97%   Filed Weights   09/12/21 1346  Weight: 157 lb (71.2 kg)   Physical Exam Vitals reviewed.  Constitutional:      Appearance: Normal appearance.  Cardiovascular:     Rate and Rhythm: Regular rhythm.     Heart sounds: Normal heart sounds.  Pulmonary:     Breath sounds:  Normal breath sounds.  Abdominal:     Palpations: Abdomen is soft.  Neurological:     Mental Status: He is alert.      LABORATORY DATA:  I have reviewed the labs as listed.  CBC    Component Value Date/Time   WBC 8.6 08/28/2021 0914   RBC 4.57 08/28/2021 0914   HGB 13.2 08/28/2021 0914   HGB 13.2 07/16/2021 0818   HCT 40.4 08/28/2021 0914   HCT 40.2 07/16/2021 0818   PLT 281 08/28/2021 0914   PLT 316  07/16/2021 0818   MCV 88.4 08/28/2021 0914   MCV 85 07/16/2021 0818   MCH 28.9 08/28/2021 0914   MCHC 32.7 08/28/2021 0914   RDW 14.9 08/28/2021 0914   RDW 14.2 07/16/2021 0818   LYMPHSABS 1.9 08/28/2021 0914   LYMPHSABS 2.5 07/16/2021 0818   MONOABS 0.7 08/28/2021 0914   EOSABS 0.9 (H) 08/28/2021 0914   EOSABS 0.8 (H) 07/16/2021 0818   BASOSABS 0.1 08/28/2021 0914   BASOSABS 0.1 07/16/2021 0818      Latest Ref Rng & Units 08/28/2021    9:14 AM 07/16/2021    8:18 AM 06/29/2021   11:20 AM  CMP  Glucose 70 - 99 mg/dL 104  105  127   BUN 8 - 23 mg/dL '29  11  17   '$ Creatinine 0.61 - 1.24 mg/dL 1.50  1.12  1.28   Sodium 135 - 145 mmol/L 136  140  137   Potassium 3.5 - 5.1 mmol/L 3.9  3.6  4.1   Chloride 98 - 111 mmol/L 101  102  101   CO2 22 - 32 mmol/L '29  21  20   '$ Calcium 8.9 - 10.3 mg/dL 9.5  9.7  9.8   Total Protein 6.5 - 8.1 g/dL 7.2  6.7  7.2   Total Bilirubin 0.3 - 1.2 mg/dL 0.6  0.6  0.3   Alkaline Phos 38 - 126 U/L 140  115  113   AST 15 - 41 U/L '14  11  12   '$ ALT 0 - 44 U/L '11  7  6     '$ DIAGNOSTIC IMAGING:  I have independently reviewed the scans and discussed with the patient.  ASSESSMENT: 1.  Bilateral kidney masses: - Patient seen at the request of Dr. Felipa Eth for further evaluation of kidney masses. - CT abdomen with and without contrast (08/09/2021): 4.1 x 4 x 4.6 cm mass in the posterior lower interpolar right kidney, extending into the central sinus fat.  Second tiny enhancing lesion in the anterior lower pole of the right kidney measuring 8 mm.  2.1 x  2.3 x 2 cm heterogeneously enhancing mass in the posterior interpolar left kidney.  10 x 6 mm left lower lobe lung nodule nonspecific. - Dr. Felipa Eth did not think he was a surgical candidate. - PET scan (09/07/2021): 4.3 cm right kidney mass, SUV 4.4.  8 mm enhancing lesion in the lower pole of the right kidney, below PET resolution.  2.2 cm lesion in the posterior interpolar left kidney with no abnormal hypermetabolic some.  No abnormal uptake in the bones.  2.  Social/family history: - Patient resident at [facility] home since 05/04/2021.  He requires full care.  He had a history of hemorrhagic stroke and neurogenic bladder.  His wife visits him at the nursing home per the attendant.  He used to build homes prior to retirement.  Non-smoker. - Family history consistent with brother with cancer   PLAN:  1.  Right kidney mass: - I have reviewed PET scan results with the patient and images with his wife.  There is a 4.3 cm mass in the right kidney with SUV max 4.4.  8 mm enhancing lesion in the lower pole of the right kidney too small for PET resolution.  2.2 cm lesion in the posterior left kidney does not show abnormal hypermetabolism.  No other metastatic disease was found. - Wife reports that he had ablation procedure on the left kidney done in [facility] few  years ago.  I could not access the records on epic care everywhere. - We discussed the local treatment option including ablation procedure versus watchful waiting.  Because of his functional status at this time, I have recommended watchful waiting with close observation. - Recommend RTC in 6 months with repeat CT scan of the abdomen and pelvis.      Orders placed this encounter:  Orders Placed This Encounter  Procedures   CT Abdomen Pelvis W Wo Contrast   CBC with Differential   Comprehensive metabolic panel      Derek Jack, MD [place] [phone]

## 2021-09-13 ENCOUNTER — Telehealth: Payer: Self-pay | Admitting: Adult Health

## 2021-09-20 NOTE — Telephone Encounter (Signed)
Will need sleep study results to review  My last office note says he was suppose to have a CPAP titration study , has this been set up  ?

## 2021-09-20 NOTE — Telephone Encounter (Signed)
Tammy, please advise if pt's sleep study  has been received.

## 2021-09-21 NOTE — Telephone Encounter (Signed)
Cpap titration has been scheduled for 09/26/21. Have you received any sleep study results? If not we can request them again from the clinic in [place]? I don't see anything under the media tab.   Will route to TP nurse to check for sleep study results.   Thanks

## 2021-09-25 ENCOUNTER — Ambulatory Visit: Payer: Medicare PPO | Admitting: Urology

## 2021-09-26 ENCOUNTER — Ambulatory Visit: Payer: Medicare PPO | Attending: Adult Health | Admitting: Pulmonary Disease

## 2021-09-26 DIAGNOSIS — G4733 Obstructive sleep apnea (adult) (pediatric): Secondary | ICD-10-CM | POA: Diagnosis not present

## 2021-09-26 DIAGNOSIS — I493 Ventricular premature depolarization: Secondary | ICD-10-CM | POA: Diagnosis not present

## 2021-09-26 DIAGNOSIS — G4736 Sleep related hypoventilation in conditions classified elsewhere: Secondary | ICD-10-CM | POA: Diagnosis not present

## 2021-09-27 ENCOUNTER — Encounter: Payer: Self-pay | Admitting: Urology

## 2021-09-27 ENCOUNTER — Ambulatory Visit (INDEPENDENT_AMBULATORY_CARE_PROVIDER_SITE_OTHER): Payer: Medicare PPO | Admitting: Urology

## 2021-09-27 VITALS — BP 112/67 | HR 62

## 2021-09-27 DIAGNOSIS — G4733 Obstructive sleep apnea (adult) (pediatric): Secondary | ICD-10-CM

## 2021-09-27 DIAGNOSIS — N2889 Other specified disorders of kidney and ureter: Secondary | ICD-10-CM | POA: Diagnosis not present

## 2021-09-27 DIAGNOSIS — R339 Retention of urine, unspecified: Secondary | ICD-10-CM

## 2021-09-27 DIAGNOSIS — R911 Solitary pulmonary nodule: Secondary | ICD-10-CM | POA: Diagnosis not present

## 2021-09-27 DIAGNOSIS — N319 Neuromuscular dysfunction of bladder, unspecified: Secondary | ICD-10-CM | POA: Diagnosis not present

## 2021-09-27 DIAGNOSIS — Z435 Encounter for attention to cystostomy: Secondary | ICD-10-CM | POA: Diagnosis not present

## 2021-09-27 NOTE — Progress Notes (Signed)
Suprapubic Cath Change  Patient is present today for a suprapubic catheter change due to urinary retention.  17m of water was drained from the balloon, a 20FR foley cath was removed from the tract with out difficulty.  Site was cleaned and prepped in a sterile fashion with betadine.  A 20FR foley cath was replaced into the tract no complications were noted. Catheter was flushed with 50cc of sterile water with 50cc return, 10 ml of sterile water was inflated into the balloon and a bed bag was attached for drainage.  Patient tolerated well.   Performed by: KCharleston Poot Follow up: Follow up as scheduled.

## 2021-09-27 NOTE — Procedures (Signed)
Patient Name: Dylan Martin, Dylan Martin Date: 09/26/2021 Gender: Male D.O.B: Aug 30, 1945 Age (years): 89 Referring Provider: Lynelle Smoke Parrett Height (inches): 66 Interpreting Physician: Kara Mead MD, ABSM Weight (lbs): 153 RPSGT: Rosebud Poles BMI: 25 MRN: [id number] Neck Size: 16.00 <br> <br> CLINICAL INFORMATION The patient is referred for a PAP titration to treat sleep apnea.  Previous diagnosis of OSA on CPAP, ICH , neurogrnic bladder  SLEEP STUDY TECHNIQUE As per the AASM Manual for the Scoring of Sleep and Associated Events v2.3 (April 2016) with a hypopnea requiring 4% desaturations.  The channels recorded and monitored were frontal, central and occipital EEG, electrooculogram (EOG), submentalis EMG (chin), nasal and oral airflow, thoracic and abdominal wall motion, anterior tibialis EMG, snore microphone, electrocardiogram, and pulse oximetry. Bilevel positive airway pressure (BPAP) was initiated at the beginning of the study and titrated to treat sleep-disordered breathing.  MEDICATIONS Medications self-administered by patient taken the night of the study : N/A  RESPIRATORY PARAMETERS Optimal IPAP Pressure (cm): 16 AHI at Optimal Pressure (/hr) 5.7 Optimal EPAP Pressure (cm): 12   Overall Minimal O2 (%): 65.00 Minimal O2 at Optimal Pressure (%): 51.00 SLEEP ARCHITECTURE Start Time: 8:57:34 PM Stop Time: 4:56:11 AM Total Time (min): 478.6 Total Sleep Time (min): 132 Sleep Latency (min): 9.8 Sleep Efficiency (%): 27.6 REM Latency (min): N/A WASO (min): 336.8 Stage N1 (%): 19.32 Stage N2 (%): 78.41 Stage N3 (%): 2.27 Stage R (%): 0 Supine (%): 100.00 Arousal Index (/hr): 28.2     CARDIAC DATA The 2 lead EKG demonstrated pacemaker generated. The mean heart rate was 62.25 beats per minute. Other EKG findings include: PVCs. LEG MOVEMENT DATA The total Periodic Limb Movements of Sleep (PLMS) were 0. The PLMS index was 0.00. A PLMS index of <15 is considered normal in  adults.  IMPRESSIONS - An optimal bi PAP pressure of 16/12 selected for this patient based on the available study data. CPAP was titrated to 15 cm & he could not tolerate - Severe oxygen desaturations were observed during this titration (min O2 = 65.00%). O2 signal was not reliable & tech had to use a portable O2 monitor - The patient snored with moderate snoring volume. - 2-lead EKG demonstrated: PVCs - Clinically significant periodic limb movements were not noted during this study. Arousals associated with PLMs were rare.   DIAGNOSIS - Obstructive Sleep Apnea (G47.33) - Nocturnal Hypoxemia (G47.36)   RECOMMENDATIONS - Recommend a trial of BiPAP 16/12 cm H2O or auto BiPAP with a large full face mask - Avoid alcohol, sedatives and other CNS depressants that may worsen sleep apnea and disrupt normal sleep architecture. - Sleep hygiene should be reviewed to assess factors that may improve sleep quality. - Weight management and regular exercise should be initiated or continued. - Return to [facility] for re-evaluation after 4 weeks of therapy. Reassess nocturnal oximetry  on BiPAP  Kara Mead MD Board Certified in [place]

## 2021-09-27 NOTE — Progress Notes (Signed)
I assisted Rosebud Poles, RPSGT with discharging patient and printing the medical necessity form for [facility] to transport patient.

## 2021-09-27 NOTE — Progress Notes (Signed)
Assessment: 1. Neurogenic bladder   2. Encounter for care or replacement of suprapubic tube ([place])   3. Bilateral renal masses   4. Pulmonary nodule, left     Plan: SP tube changed today with 20 French Foley catheter. Return to office in 1 month for SP tube change. Agree with plans for continued observation of bilateral renal masses given his limited functional status.  Please transport patient to Urology office on a stretcher to facilitate SP tube change.  Chief Complaint:  Chief Complaint  Patient presents with   Neurogenic bladder    History of Present Illness:  Dylan Martin is a 76 y.o. year old male who is seen for further evaluation of a neurogenic bladder and chronic suprapubic tube.  He has recently relocated to the area and is currently going to rehab at [facility] facility.  He had a SP tube placed in October 2022 while living in the [place] area.  This was done for urinary retention and a neurogenic bladder.  The catheter has been draining well.  He has undergone monthly tube changes by the urologist there.  He has not had problems with UTIs.  No recent gross hematuria.  The SP tube was last changed on 05/21/2021.  Review of outside records from Dr. Buelah Manis indicates the patient was found to have bilateral renal masses on CT imaging from 01/31/2021.  A 4.2 cm renal mass was noted on the right.  A 1.6 cm heterogeneous mass was noted on the left.  Both of these masses were concerning for malignancy by report.  Also noted was an ill-defined lytic lesion in the L2 vertebral body.  CT scan from December 2022 showed no evidence of pulmonary metastasis.  Plans at that time were for close follow-up with a repeat CT scan of the chest and abdomen in July 2023.  His SP tube was last changed on 06/29/2021.  Urine culture at that time grew >100 K Citrobacter. CMP from 06/29/2021 showed a creatinine of 1.28, normal LFTs. CMP from 07/16/2021 showed a creatinine of 1.12, normal  LFTs.  His suprapubic tube was last changed on 07/26/2021. CT of the chest, abdomen, and pelvis from 08/11/2021 showed a 4.6 cm heterogeneously enhancing mass in the posterior lower right kidney consistent with renal cell carcinoma, 8 mm enhancing lesion anterior lower pole right kidney, 2.3 x 2.3 x 2.0 cm enhancing mass in the left kidney consistent with renal cell carcinoma and a 10 x 6 mm left lower pole pulmonary nodule.  He was seen in consultation by Dr. Delton Coombes for evaluation of his bilateral renal masses and pulmonary nodule.  A PET CT scan showed activity in the renal masses but no evidence of obvious metastatic disease.  Management options were discussed including ablation versus observation.  Observation was recommended given his functional status.  He presents today for SP tube change and follow-up.  The SP tube has been draining well.  No recent UTI symptoms.  Portions of the above documentation were copied from a prior visit for review purposes only.   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[place])    Aphasia following cerebral infarction    Atherosclerotic heart disease of native coronary artery without angina pectoris    Benign prostatic hyperplasia with lower urinary tract symptoms    Benign prostatic hyperplasia without lower urinary tract symptoms    Bradycardia, unspecified    Cerebral infarction ([place])  Chronic kidney disease, stage 3 unspecified (HCC)    Cognitive communication deficit    Dysarthria    Dysphagia    Encephalopathy    Essential (primary) hypertension    Gastro-esophageal reflux disease without esophagitis    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HCC)    Hemiplegia (HCC)    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)    Hyperlipidemia    Hyperosmolality and hypernatremia    Muscle weakness (generalized)    Myocardial infarct, old     Neuromuscular dysfunction of bladder, unspecified    Nontraumatic intracerebral hemorrhage (HCC)    Nontraumatic intracerebral hemorrhage (HCC)    Obstructive sleep apnea    Paroxysmal atrial fibrillation (HCC)    Presence of cardiac pacemaker    Presence of coronary angioplasty implant and graft    Pressure ulcer    Renovascular hypertension    Retention of urine    Seizures (HCC)    TIA (transient ischemic attack)    Type 2 diabetes mellitus without complication (HCC)    Unspecified atrial fibrillation (HCC)    Unspecified disorder of synovium and tendon, right shoulder    Unspecified lack of coordination    Weakness     Past Surgical History:  Past Surgical History:  Procedure Laterality Date   FEMUR SURGERY Left    HIP SURGERY Left    SPINAL CORD STIMULATOR IMPLANT     per wife nerve stimulator in back    Allergies:  Allergies  Allergen Reactions   Shellfish Allergy    Aspirin Other (See Comments)    dr told pt not to take ASA after aneurysm    Family History:  No family history on file.  Social History:  Social History   Tobacco Use   Smoking status: Never   Smokeless tobacco: Never  Vaping Use   Vaping Use: Never used  Substance Use Topics   Alcohol use: Not Currently   Drug use: Never    ROS: Constitutional:  Negative for fever, chills, weight loss CV: Negative for chest pain, previous MI, hypertension Respiratory:  Negative for shortness of breath, wheezing, sleep apnea, frequent cough GI:  Negative for nausea, vomiting, bloody stool, GERD  Physical exam: BP 112/67   Pulse 62  GENERAL APPEARANCE:  well developed, well nourished, NAD HEENT:  Atraumatic, normocephalic, oropharynx clear NECK:  Supple without lymphadenopathy or thyromegaly ABDOMEN:  Soft, non-tender, no masses; SP tube site with granulation tissue EXTREMITIES:  LE contractures NEUROLOGIC:  Alert and oriented x 3, in wheelchair CN II-XII grossly intact SKIN:  Warm, dry, and  intact  Results: none

## 2021-10-01 ENCOUNTER — Telehealth: Payer: Self-pay | Admitting: Adult Health

## 2021-10-01 NOTE — Telephone Encounter (Signed)
ATC x1, LVM to return call.   We did request the sleep study results, however, have not received it at this time.

## 2021-10-01 NOTE — Telephone Encounter (Signed)
See prev. Phone note. Closing encounter. Heather RN ATC patient after this encounter was opened.

## 2021-10-01 NOTE — Telephone Encounter (Signed)
Do not see where this patient had been contacted?  [place] patient so routing to [facility] triage for follow up

## 2021-10-01 NOTE — Telephone Encounter (Signed)
Patient's wife called back to inform that patient had a new sleep study on 9/6 at [facility].   Please call back at [phone].

## 2021-10-01 NOTE — Telephone Encounter (Signed)
Sleep study is showing up under procedure tab of pt's chart

## 2021-10-02 NOTE — Telephone Encounter (Signed)
Patient's wife is returning a call regarding her husband's sleep study.  She sounded a little confused regarding the results and his next appt.  Please call back to discuss at [phone]

## 2021-10-03 NOTE — Progress Notes (Signed)
ATC x2.  LVM to return call.

## 2021-10-05 ENCOUNTER — Telehealth: Payer: Self-pay | Admitting: Pulmonary Disease

## 2021-10-05 ENCOUNTER — Encounter: Payer: Medicare PPO | Admitting: Pulmonary Disease

## 2021-10-05 NOTE — Telephone Encounter (Signed)
Sleep titration study showed" Recommend a trial of BiPAP 16/12 cm H2O or auto BiPAP with a large full face mask  Patient has an appointment with Dr. Elsworth Soho on September 19 and we discussed in detail with patient and wife

## 2021-10-05 NOTE — Telephone Encounter (Signed)
Called patient and he would like to get his sleep study results.   Please advise TP

## 2021-10-08 NOTE — Telephone Encounter (Signed)
Called patient and advised him that at his appointment tomorrow with Dr  Elsworth Soho that he will discuss treatment option. He verbalized understanding. Nothing further needed

## 2021-10-09 ENCOUNTER — Encounter: Payer: Self-pay | Admitting: Pulmonary Disease

## 2021-10-09 ENCOUNTER — Ambulatory Visit (INDEPENDENT_AMBULATORY_CARE_PROVIDER_SITE_OTHER): Payer: Medicare PPO | Admitting: Pulmonary Disease

## 2021-10-09 VITALS — BP 118/60 | HR 60 | Temp 97.6°F | Ht 71.0 in

## 2021-10-09 DIAGNOSIS — I611 Nontraumatic intracerebral hemorrhage in hemisphere, cortical: Secondary | ICD-10-CM

## 2021-10-09 DIAGNOSIS — G4733 Obstructive sleep apnea (adult) (pediatric): Secondary | ICD-10-CM

## 2021-10-09 NOTE — Assessment & Plan Note (Signed)
Residual AHI of 30/hour on CPAP download showing that CPAP was ineffective.  During sleep study CPAP was titrated up to 15 cm and then he needed BiPAP and an optimal pressure of 16/12.  We will initiate auto BiPAP with EPAP minimum of 10, pressure support of 4 and IPAP max of 18 cm.  Hopefully he will be able to tolerate the increased pressure better on BiPAP and not be pulling on his mask as much. He has settled down with a fullface mask He may need nursing staff to assist him  Okay to use melatonin 5 mg at 8 PM bedtime

## 2021-10-09 NOTE — Patient Instructions (Signed)
  X Rx for auto BiPAP -EPAP min 10, PS +4, IPAP max 18   to [facility]  Download in 1 month   OK to take upto '5mg'$  melatonin at 8 pm

## 2021-10-09 NOTE — Assessment & Plan Note (Signed)
He has residual bilateral lower extremity contractures.  Rehab ongoing and wife is hopeful that he will get back on his feet after he obtains braces

## 2021-10-09 NOTE — Progress Notes (Signed)
   Subjective:    Patient ID: Dylan Martin, male    DOB: [birth year], 76 y.o.   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  DL showed residual AHI 31/h .  Hence he was scheduled for a titration study.  We will review the results of the study. Detail history obtained from wife [name].  They have been together for 20 years.  He is at a rehab facility, has contractures and is wheelchair-bound.  She has tried to get him braces and hopes that he will be able to walk someday and she can take him home. PCP had started trazodone but she did not like that this kept him sleepy and this was discontinued, he is back to using his melatonin. He does not sleep through the night and occasionally pulls his mask off and she is worried about him dying in his sleep without his CPAP  He has a PEG tube but takes orally, has lost significant weight to less than 160 pounds last time he was weighed  Significant tests/ events reviewed 07/2014 NPSG >> ([place]) 222 lbs,   AHI 49/h, lowest desat 76% , TST only 46 mins  PAP titration 09/2021 >>optimal bi PAP pressure of 16/12  CPAP was titrated to 15 cm & he could not tolerate Lowest sat 65%, O2 signal was not reliable & tech had to use a portable O2 monitor   Review of Systems neg for any significant sore throat, dysphagia, itching, sneezing, nasal congestion or excess/ purulent secretions, fever, chills, sweats, unintended wt loss, pleuritic or exertional cp, hempoptysis, orthopnea pnd or change in chronic leg swelling. Also denies presyncope,  palpitations, heartburn, abdominal pain, nausea, vomiting, diarrhea or change in bowel or urinary habits, dysuria,hematuria, rash, arthralgias, visual complaints, headache, numbness weakness or ataxia.     Objective:   Physical Exam   Gen. wheelchair-bound, well-built with indwelling suprapubic catheter and PEG tube ENT - no pallor,icterus, no post nasal drip Neck: No JVD, no thyromegaly, no carotid bruits Lungs: no use of accessory muscles, no dullness to percussion, clear without rales or rhonchi  Cardiovascular: Rhythm regular, heart sounds  normal, no murmurs or gallops, no peripheral edema Abdomen: soft and non-tender, no hepatosplenomegaly, BS normal.  PEG site clean Musculoskeletal: BLE contractures, no cyanosis or clubbing Neuro: Right arm and blateral leg weakness, follows one-step commands, speech normal,        Assessment & Plan:

## 2021-10-10 ENCOUNTER — Ambulatory Visit: Payer: Medicare PPO | Admitting: Family Medicine

## 2021-10-10 NOTE — Telephone Encounter (Signed)
Results discussed with wife.  Nothing further needed.     Priscille Kluver, [place]  10/05/2021  3:11 PM EDT Back to [place] and spoke w/ pt wife (dpr). She verbalized understanding and NFN.    Vanessa Barbara, RN  10/03/2021  1:43 PM EDT     ATC x2.  LVM to return call.   Priscille Kluver, [place]  10/01/2021 10:23 AM EDT     ATC pt LVMx1 for him to call office back.     Melvenia Needles, NP  09/28/2021  1:09 PM EDT     Sleep study shows patient will need a BiPAP.  We will discuss in detail at follow-up visit this month with Dr. Elsworth Soho with patient and wife.

## 2021-10-30 ENCOUNTER — Encounter: Payer: Self-pay | Admitting: Urology

## 2021-10-30 ENCOUNTER — Ambulatory Visit (INDEPENDENT_AMBULATORY_CARE_PROVIDER_SITE_OTHER): Payer: Medicare PPO | Admitting: Urology

## 2021-10-30 VITALS — BP 104/68 | HR 66

## 2021-10-30 DIAGNOSIS — R339 Retention of urine, unspecified: Secondary | ICD-10-CM | POA: Diagnosis not present

## 2021-10-30 DIAGNOSIS — N319 Neuromuscular dysfunction of bladder, unspecified: Secondary | ICD-10-CM

## 2021-10-30 DIAGNOSIS — Z435 Encounter for attention to cystostomy: Secondary | ICD-10-CM | POA: Diagnosis not present

## 2021-10-30 DIAGNOSIS — N2889 Other specified disorders of kidney and ureter: Secondary | ICD-10-CM

## 2021-10-30 NOTE — Progress Notes (Signed)
Assessment: 1. Neurogenic bladder   2. Encounter for care or replacement of suprapubic tube ([place])   3. Bilateral renal masses    Plan: SP tube changed today with 20 French Foley catheter. Return to office in 1 month for SP tube change. Will continue with observation of bilateral renal masses given his limited functional status.  Please transport patient to Urology office on a stretcher to facilitate SP tube change.  Chief Complaint:  Chief Complaint  Patient presents with   neurogenic bladder    History of Present Illness:  Dylan Martin is a 76 y.o. year old male who is seen for further evaluation of a neurogenic bladder and chronic suprapubic tube.  He has recently relocated to the area and is currently going to rehab at [facility] facility.  He had a SP tube placed in October 2022 while living in the [place] area.  This was done for urinary retention and a neurogenic bladder.  The catheter has been draining well.  He has undergone monthly tube changes by the urologist there.  He has not had problems with UTIs.  No recent gross hematuria.  The SP tube was last changed on 05/21/2021.  Review of outside records from Dr. Buelah Manis indicates the patient was found to have bilateral renal masses on CT imaging from 01/31/2021.  A 4.2 cm renal mass was noted on the right.  A 1.6 cm heterogeneous mass was noted on the left.  Both of these masses were concerning for malignancy by report.  Also noted was an ill-defined lytic lesion in the L2 vertebral body.  CT scan from December 2022 showed no evidence of pulmonary metastasis.  Plans at that time were for close follow-up with a repeat CT scan of the chest and abdomen in July 2023.  His SP tube was last changed on 06/29/2021.  Urine culture at that time grew >100 K Citrobacter. CMP from 06/29/2021 showed a creatinine of 1.28, normal LFTs. CMP from 07/16/2021 showed a creatinine of 1.12, normal LFTs.  His suprapubic tube was last changed on  07/26/2021. CT of the chest, abdomen, and pelvis from 08/11/2021 showed a 4.6 cm heterogeneously enhancing mass in the posterior lower right kidney consistent with renal cell carcinoma, 8 mm enhancing lesion anterior lower pole right kidney, 2.3 x 2.3 x 2.0 cm enhancing mass in the left kidney consistent with renal cell carcinoma and a 10 x 6 mm left lower pole pulmonary nodule.  He was seen in consultation by Dr. Delton Coombes for evaluation of his bilateral renal masses and pulmonary nodule in 8/23.  A PET CT scan showed activity in the renal masses but no evidence of obvious metastatic disease.  Management options were discussed including ablation versus observation.  Observation was recommended given his functional status.  He presents today for SP tube change.  His SP tube has been draining well.  No recent UTI symptoms.  No gross hematuria or flank pain.  Portions of the above documentation were copied from a prior visit for review purposes only.   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[place])    Aphasia following cerebral infarction    Atherosclerotic heart disease of native coronary artery without angina pectoris    Benign prostatic hyperplasia with lower urinary tract symptoms    Benign prostatic hyperplasia without lower urinary tract symptoms    Bradycardia, unspecified    Cerebral infarction (HCC)    Chronic kidney  disease, stage 3 unspecified ([place])    Cognitive communication deficit    Dysarthria    Dysphagia    Encephalopathy    Essential (primary) hypertension    Gastro-esophageal reflux disease without esophagitis    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[place])    Hemiplegia (HCC)    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)    Hyperlipidemia    Hyperosmolality and hypernatremia    Muscle weakness (generalized)    Myocardial infarct, old    Neuromuscular dysfunction of  bladder, unspecified    Nontraumatic intracerebral hemorrhage (HCC)    Nontraumatic intracerebral hemorrhage (HCC)    Obstructive sleep apnea    Paroxysmal atrial fibrillation (HCC)    Presence of cardiac pacemaker    Presence of coronary angioplasty implant and graft    Pressure ulcer    Renovascular hypertension    Retention of urine    Seizures (HCC)    TIA (transient ischemic attack)    Type 2 diabetes mellitus without complication (HCC)    Unspecified atrial fibrillation (HCC)    Unspecified disorder of synovium and tendon, right shoulder    Unspecified lack of coordination    Weakness     Past Surgical History:  Past Surgical History:  Procedure Laterality Date   FEMUR SURGERY Left    HIP SURGERY Left    SPINAL CORD STIMULATOR IMPLANT     per wife nerve stimulator in back    Allergies:  Allergies  Allergen Reactions   Shellfish Allergy    Aspirin Other (See Comments)    dr told pt not to take ASA after aneurysm   Other     Blood thinners- wife wants documented that patient should not be on blood thinners due to past medical history     Family History:  No family history on file.  Social History:  Social History   Tobacco Use   Smoking status: Never   Smokeless tobacco: Never  Vaping Use   Vaping Use: Never used  Substance Use Topics   Alcohol use: Not Currently   Drug use: Never    ROS: Constitutional:  Negative for fever, chills, weight loss CV: Negative for chest pain, previous MI, hypertension Respiratory:  Negative for shortness of breath, wheezing, sleep apnea, frequent cough GI:  Negative for nausea, vomiting, bloody stool, GERD   Physical exam: BP 104/68   Pulse 66  GENERAL APPEARANCE:  Well appearing, well developed, well nourished, NAD HEENT:  Atraumatic, normocephalic, oropharynx clear NECK:  Supple without lymphadenopathy or thyromegaly ABDOMEN:  Soft, non-tender, no masses; SP tube site intact EXTREMITIES:  Without clubbing,  cyanosis, or edema NEUROLOGIC:  Alert and oriented x 3, in wheelchair, CN II-XII grossly intact MENTAL STATUS:  appropriate BACK:  Non-tender to palpation, No CVAT SKIN:  Warm, dry, and intact  Results: None

## 2021-10-30 NOTE — Progress Notes (Signed)
Suprapubic Cath Change  Patient is present today for a suprapubic catheter change due to urinary retention.  51m of water was drained from the balloon, a 20FR foley cath was removed from the tract with out difficulty.  Site was cleaned and prepped in a sterile fashion with betadine.  A 20FR foley cath was replaced into the tract no complications were noted. Urine return was noted, 10 ml of sterile water was inflated into the balloon and a bedside bag was attached for drainage.  Patient tolerated well.   Performed by: Oletta Buehring LPN  Follow up: 1 month NV cath change.

## 2021-11-08 ENCOUNTER — Ambulatory Visit (INDEPENDENT_AMBULATORY_CARE_PROVIDER_SITE_OTHER): Payer: Medicare PPO

## 2021-11-08 DIAGNOSIS — I495 Sick sinus syndrome: Secondary | ICD-10-CM | POA: Diagnosis not present

## 2021-11-08 LAB — CUP PACEART REMOTE DEVICE CHECK
Battery Remaining Longevity: 63 mo
Battery Voltage: 2.96 V
Brady Statistic AP VP Percent: 88.05 %
Brady Statistic AP VS Percent: 0.02 %
Brady Statistic AS VP Percent: 10.51 %
Brady Statistic AS VS Percent: 1.41 %
Brady Statistic RA Percent Paced: 89.09 %
Brady Statistic RV Percent Paced: 98.57 %
Date Time Interrogation Session: 20231019004736
Implantable Lead Implant Date: 20180327
Implantable Lead Implant Date: 20180327
Implantable Lead Location: 753859
Implantable Lead Location: 753860
Implantable Lead Model: 5076
Implantable Lead Model: 5076
Implantable Pulse Generator Implant Date: 20180327
Lead Channel Impedance Value: 304 Ohm
Lead Channel Impedance Value: 323 Ohm
Lead Channel Impedance Value: 380 Ohm
Lead Channel Impedance Value: 437 Ohm
Lead Channel Pacing Threshold Amplitude: 0.5 V
Lead Channel Pacing Threshold Amplitude: 0.625 V
Lead Channel Pacing Threshold Pulse Width: 0.4 ms
Lead Channel Pacing Threshold Pulse Width: 0.4 ms
Lead Channel Sensing Intrinsic Amplitude: 2.375 mV
Lead Channel Sensing Intrinsic Amplitude: 2.375 mV
Lead Channel Sensing Intrinsic Amplitude: 23.5 mV
Lead Channel Sensing Intrinsic Amplitude: 23.5 mV
Lead Channel Setting Pacing Amplitude: 1.5 V
Lead Channel Setting Pacing Amplitude: 2 V
Lead Channel Setting Pacing Pulse Width: 0.4 ms
Lead Channel Setting Sensing Sensitivity: 1.2 mV

## 2021-11-20 NOTE — Progress Notes (Signed)
Remote pacemaker transmission.   

## 2021-11-27 ENCOUNTER — Ambulatory Visit (INDEPENDENT_AMBULATORY_CARE_PROVIDER_SITE_OTHER): Payer: Medicare PPO | Admitting: Urology

## 2021-11-27 ENCOUNTER — Encounter: Payer: Self-pay | Admitting: Urology

## 2021-11-27 ENCOUNTER — Ambulatory Visit: Payer: Medicare PPO | Admitting: Physician Assistant

## 2021-11-27 DIAGNOSIS — R339 Retention of urine, unspecified: Secondary | ICD-10-CM

## 2021-11-27 DIAGNOSIS — N319 Neuromuscular dysfunction of bladder, unspecified: Secondary | ICD-10-CM

## 2021-11-27 DIAGNOSIS — Z435 Encounter for attention to cystostomy: Secondary | ICD-10-CM

## 2021-11-27 DIAGNOSIS — N2889 Other specified disorders of kidney and ureter: Secondary | ICD-10-CM | POA: Diagnosis not present

## 2021-11-27 NOTE — Progress Notes (Signed)
Suprapubic Cath Change  Patient is present today for a suprapubic catheter change due to urinary retention.  10 ml of water was drained from the balloon, a 20 FR foley cath was removed from the tract with out difficulty.  Site was cleaned and prepped in a sterile fashion with betadine.  A 20FR foley cath was replaced into the tract no complications were noted. Cather was flushed with 34m of sterile water and 50 ml of sterile water was returned, 10 ml of sterile water was inflated into the balloon and a overnight bag was attached for drainage.  Patient tolerated well. A night bag was given to patient and proper instruction was given on how to switch bags.    Performed by: SMarisue Brooklyn CMA  Follow up: Follow up as scheduled.

## 2021-11-27 NOTE — Progress Notes (Signed)
Assessment: 1. Neurogenic bladder   2. Encounter for care or replacement of suprapubic tube ([place])   3. Bilateral renal masses     Plan: SP tube changed today with 20 French Foley catheter. Return to office in 1 month for SP tube change. Will continue with observation of bilateral renal masses given his limited functional status.  Please transport patient to Urology office on a stretcher to facilitate SP tube change.  Chief Complaint:  Chief Complaint  Patient presents with   SP tube change    History of Present Illness:  Dylan Martin is a 76 y.o. year old male who is seen for further evaluation of a neurogenic bladder and chronic suprapubic tube.  He has recently relocated to the area and is currently going to rehab at [facility] facility.  He had a SP tube placed in October 2022 while living in the [place] area.  This was done for urinary retention and a neurogenic bladder.  The catheter has been draining well.  He has undergone monthly tube changes by the urologist there.  He has not had problems with UTIs.  No recent gross hematuria.  The SP tube was last changed on 05/21/2021.  Review of outside records from Dr. Buelah Manis indicates the patient was found to have bilateral renal masses on CT imaging from 01/31/2021.  A 4.2 cm renal mass was noted on the right.  A 1.6 cm heterogeneous mass was noted on the left.  Both of these masses were concerning for malignancy by report.  Also noted was an ill-defined lytic lesion in the L2 vertebral body.  CT scan from December 2022 showed no evidence of pulmonary metastasis.  Plans at that time were for close follow-up with a repeat CT scan of the chest and abdomen in July 2023.  His SP tube was changed on 06/29/2021.  Urine culture at that time grew >100 K Citrobacter. CMP from 06/29/2021 showed a creatinine of 1.28, normal LFTs. CMP from 07/16/2021 showed a creatinine of 1.12, normal LFTs.  CT of the chest, abdomen, and pelvis from  08/11/2021 showed a 4.6 cm heterogeneously enhancing mass in the posterior lower right kidney consistent with renal cell carcinoma, 8 mm enhancing lesion anterior lower pole right kidney, 2.3 x 2.3 x 2.0 cm enhancing mass in the left kidney consistent with renal cell carcinoma and a 10 x 6 mm left lower pole pulmonary nodule.  He was seen in consultation by Dr. Delton Coombes for evaluation of his bilateral renal masses and pulmonary nodule in 8/23.  A PET CT scan showed activity in the renal masses but no evidence of obvious metastatic disease.  Management options were discussed including ablation versus observation.  Observation was recommended given his functional status.  He presents today for SP tube change.  His last tube change was on 10/30/2021.  His tube has been draining well.  No recent UTI symptoms.  No gross hematuria.  Portions of the above documentation were copied from a prior visit for review purposes only.   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[place])    Aphasia following cerebral infarction    Atherosclerotic heart disease of native coronary artery without angina pectoris    Benign prostatic hyperplasia with lower urinary tract symptoms    Benign prostatic hyperplasia without lower urinary tract symptoms    Bradycardia, unspecified    Cerebral infarction ([place])    Chronic kidney disease, stage 3  unspecified ([place])    Cognitive communication deficit    Dysarthria    Dysphagia    Encephalopathy    Essential (primary) hypertension    Gastro-esophageal reflux disease without esophagitis    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[place])    Hemiplegia (HCC)    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)    Hyperlipidemia    Hyperosmolality and hypernatremia    Muscle weakness (generalized)    Myocardial infarct, old    Neuromuscular dysfunction of bladder, unspecified     Nontraumatic intracerebral hemorrhage (HCC)    Nontraumatic intracerebral hemorrhage (HCC)    Obstructive sleep apnea    Paroxysmal atrial fibrillation (HCC)    Presence of cardiac pacemaker    Presence of coronary angioplasty implant and graft    Pressure ulcer    Renovascular hypertension    Retention of urine    Seizures (HCC)    TIA (transient ischemic attack)    Type 2 diabetes mellitus without complication (HCC)    Unspecified atrial fibrillation (HCC)    Unspecified disorder of synovium and tendon, right shoulder    Unspecified lack of coordination    Weakness     Past Surgical History:  Past Surgical History:  Procedure Laterality Date   FEMUR SURGERY Left    HIP SURGERY Left    SPINAL CORD STIMULATOR IMPLANT     per wife nerve stimulator in back    Allergies:  Allergies  Allergen Reactions   Shellfish Allergy    Aspirin Other (See Comments)    dr told pt not to take ASA after aneurysm   Other     Blood thinners- wife wants documented that patient should not be on blood thinners due to past medical history     Family History:  History reviewed. No pertinent family history.  Social History:  Social History   Tobacco Use   Smoking status: Never   Smokeless tobacco: Never  Vaping Use   Vaping Use: Never used  Substance Use Topics   Alcohol use: Not Currently   Drug use: Never    ROS: Constitutional:  Negative for fever, chills, weight loss CV: Negative for chest pain, previous MI, hypertension Respiratory:  Negative for shortness of breath, wheezing, sleep apnea, frequent cough GI:  Negative for nausea, vomiting, bloody stool, GERD   Physical exam: GENERAL APPEARANCE:  Well appearing, well developed, well nourished, NAD HEENT:  Atraumatic, normocephalic, oropharynx clear NECK:  Supple without lymphadenopathy or thyromegaly ABDOMEN:  Soft, non-tender, no masses EXTREMITIES:  Wthout clubbing, cyanosis, or edema NEUROLOGIC:  Alert and oriented x 3,  CN II-XII grossly intact MENTAL STATUS:  appropriate BACK:  Non-tender to palpation, No CVAT SKIN:  Warm, dry, and intact  Results: None

## 2021-12-04 ENCOUNTER — Ambulatory Visit: Payer: Medicare PPO

## 2021-12-10 ENCOUNTER — Other Ambulatory Visit (HOSPITAL_COMMUNITY): Payer: Self-pay | Admitting: Nurse Practitioner

## 2021-12-10 ENCOUNTER — Telehealth (HOSPITAL_COMMUNITY): Payer: Self-pay

## 2021-12-10 DIAGNOSIS — Z431 Encounter for attention to gastrostomy: Secondary | ICD-10-CM

## 2021-12-10 NOTE — Telephone Encounter (Signed)
Called to schedule gtube removal and find out who placed the peg. Dylan Martin [phone] at facility, no answer, no vm. AW

## 2021-12-24 ENCOUNTER — Ambulatory Visit: Payer: Medicare PPO

## 2021-12-24 ENCOUNTER — Telehealth (HOSPITAL_COMMUNITY): Payer: Self-pay

## 2021-12-24 NOTE — Telephone Encounter (Signed)
Returned call to [facility] to get pt scheduled for peg removal, no answer, left vm. AW

## 2022-01-01 ENCOUNTER — Ambulatory Visit (INDEPENDENT_AMBULATORY_CARE_PROVIDER_SITE_OTHER): Payer: Medicare PPO | Admitting: Urology

## 2022-01-01 DIAGNOSIS — N319 Neuromuscular dysfunction of bladder, unspecified: Secondary | ICD-10-CM

## 2022-01-01 DIAGNOSIS — R339 Retention of urine, unspecified: Secondary | ICD-10-CM | POA: Diagnosis not present

## 2022-01-01 NOTE — Progress Notes (Signed)
Suprapubic Cath Change  Patient is present today for a suprapubic catheter change due to urinary retention.  87m of water was drained from the balloon, a 20FR foley cath was removed from the tract with out difficulty.  Site was cleaned and prepped in a sterile fashion with betadine.  A 20FR foley cath was replaced into the tract no complications were noted. Urine return was noted, 10 ml of sterile water was inflated into the balloon and a bedside bag was attached for drainage.  Patient tolerated well.  Performed by: Moksh Loomer LPN  Follow up: 1 month sp change

## 2022-01-01 NOTE — Patient Instructions (Signed)

## 2022-01-03 ENCOUNTER — Ambulatory Visit (HOSPITAL_COMMUNITY): Payer: Medicare PPO

## 2022-01-08 ENCOUNTER — Ambulatory Visit: Payer: Medicare PPO | Admitting: Pulmonary Disease

## 2022-01-09 ENCOUNTER — Encounter: Payer: Self-pay | Admitting: Pulmonary Disease

## 2022-01-09 ENCOUNTER — Ambulatory Visit (INDEPENDENT_AMBULATORY_CARE_PROVIDER_SITE_OTHER): Payer: Medicare PPO | Admitting: Pulmonary Disease

## 2022-01-09 DIAGNOSIS — G4733 Obstructive sleep apnea (adult) (pediatric): Secondary | ICD-10-CM | POA: Diagnosis not present

## 2022-01-09 NOTE — Patient Instructions (Signed)
  x recheck BiPAP download in 3 months. X send verbal order to nursing home to "use BiPAP during sleep at all times"

## 2022-01-09 NOTE — Progress Notes (Signed)
[facility] facility- spoke with Margart Sickles and she was made aware of verbal order for patient to use bipap at all times.

## 2022-01-09 NOTE — Progress Notes (Signed)
Subjective:    Patient ID: Dylan Martin, male    DOB: 1945-10-07, 76 y.o.   MRN: [id number]  HPI  I connected with  [name] on 01/09/22 by phone/  video enabled telemedicine application and verified that I am speaking with the correct person using two identifiers.     Location: Patient: Home Provider: Office - [facility] [place] office    I discussed the limitations of evaluation and management by telemedicine and the availability of in person appointments. The patient expressed understanding and agreed to proceed. I also discussed with the patient that there may be a patient responsible charge related to this service. The patient expressed understanding and agreed to proceed.   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 trazodone kept him sleepy    DL showed residual AHI 31/h >> started autobiPAP  [name] states that he is tolerating this better and is at least trying to use this every night however at the nursing home they do not emphasize using the machine and he feels it is not no they do not force him to use it like she would.  He does not seem to be having any problems with mask or pressure.  He is wearing his braces more regularly now for contractures and she is still hopeful that he will get back on his feet They are having a COVID infection increased Also she has  not been able to visit for a few days   Significant tests/ events reviewed  07/2014 NPSG >> ([place]) 222 lbs,   AHI 49/h, lowest desat 76% , TST only 46 mins   PAP titration 09/2021 >>optimal bi PAP pressure of 16/12  CPAP was titrated to 15 cm & he could not tolerate Lowest sat 65%, O2 signal was not reliable & tech had to use a portable O2 monitor    Review of Systems neg for any significant sore throat, dysphagia, itching, sneezing, nasal congestion or excess/ purulent secretions, fever, chills, sweats, unintended wt loss, pleuritic or exertional cp, hempoptysis, orthopnea pnd or change in chronic leg swelling. Also denies presyncope, palpitations, heartburn, abdominal pain, nausea, vomiting, diarrhea or change in bowel or urinary habits, dysuria,hematuria, rash, arthralgias, visual complaints, headache, numbness weakness or ataxia.     Objective:   Physical Exam  Unable      Assessment & Plan:   OSA treated with BiPAP -auto BiPAP download was reviewed which shows residual AHI of 15/hour on auto settings with average pressure of 14.5/10.5.  He seems to put it on every night but certain nights only for couple of hours.  Would like to see compliance increased to 4 hours every night.  We can then try to correct AHI better.  He also has a large leak. We will try to send an order to the nursing home to emphasize BiPAP  use during sleep at all times. We will reassess with download in 3 months to see if compliance is improved  Total encounter time was 21 minutes  Charon Akamine V. Elsworth Soho MD

## 2022-01-22 ENCOUNTER — Ambulatory Visit (HOSPITAL_COMMUNITY)
Admission: RE | Admit: 2022-01-22 | Discharge: 2022-01-22 | Disposition: A | Discharge status: Home / Self Care | Payer: Medicare PPO | Source: Ambulatory Visit | Attending: Nurse Practitioner | Admitting: Nurse Practitioner

## 2022-01-28 ENCOUNTER — Ambulatory Visit (INDEPENDENT_AMBULATORY_CARE_PROVIDER_SITE_OTHER): Payer: Medicare PPO | Admitting: Urology

## 2022-01-28 DIAGNOSIS — N319 Neuromuscular dysfunction of bladder, unspecified: Secondary | ICD-10-CM | POA: Diagnosis not present

## 2022-01-28 DIAGNOSIS — R339 Retention of urine, unspecified: Secondary | ICD-10-CM

## 2022-01-28 MED ORDER — NYSTATIN-TRIAMCINOLONE 100000-0.1 UNIT/GM-% EX OINT: 1.0000 | TOPICAL_OINTMENT | Freq: Two times a day (BID) | CUTANEOUS | 0 refills | Status: DC

## 2022-01-28 NOTE — Progress Notes (Signed)
Suprapubic Cath Change  Patient is present today for a suprapubic catheter change due to urinary retention.  14m of water was drained from the balloon, a 20FR foley cath was removed from the tract with out difficulty.  Site was cleaned and prepped in a sterile fashion with betadine.  A 20FR foley cath was replaced into the tract no complications were noted. Urine return was noted, 10 ml of sterile water was inflated into the balloon and a bed bag was attached for drainage.  Patient tolerated well. A night bag was given to patient and proper instruction was given on how to switch bags.    Performed by: KLevi Aland CMA  Follow up: Follow up as scheduled.

## 2022-01-29 ENCOUNTER — Encounter (HOSPITAL_COMMUNITY): Payer: Self-pay

## 2022-01-29 ENCOUNTER — Ambulatory Visit (HOSPITAL_COMMUNITY): Admission: RE | Admit: 2022-01-29 | Payer: Medicare PPO | Source: Ambulatory Visit

## 2022-01-31 ENCOUNTER — Ambulatory Visit (HOSPITAL_COMMUNITY): Payer: Medicare PPO

## 2022-02-07 ENCOUNTER — Ambulatory Visit: Payer: Medicare PPO | Attending: Internal Medicine

## 2022-02-07 DIAGNOSIS — I495 Sick sinus syndrome: Secondary | ICD-10-CM

## 2022-02-12 ENCOUNTER — Telehealth: Payer: Self-pay | Admitting: Internal Medicine

## 2022-02-12 NOTE — Telephone Encounter (Signed)
Patient's wife states the patient's new pacemaker device was received. She states she is going to drive to that patient's living facility after this call to plugs it in it. She says it will take less than 12 minutes to connect. She says it is different from his previous one. She says with this one she just pushes a button to get it started and send transmissions. She requests a call back to make sure it is hooked up right.

## 2022-02-13 LAB — CUP PACEART REMOTE DEVICE CHECK
Battery Remaining Longevity: 58 mo
Battery Voltage: 2.96 V
Brady Statistic AP VP Percent: 82.17 %
Brady Statistic AP VS Percent: 0.01 %
Brady Statistic AS VP Percent: 16.85 %
Brady Statistic AS VS Percent: 0.96 %
Brady Statistic RA Percent Paced: 82.75 %
Brady Statistic RV Percent Paced: 99.02 %
Date Time Interrogation Session: 20240123180406
Implantable Lead Connection Status: 753985
Implantable Lead Connection Status: 753985
Implantable Lead Implant Date: 20180327
Implantable Lead Implant Date: 20180327
Implantable Lead Location: 753859
Implantable Lead Location: 753860
Implantable Lead Model: 5076
Implantable Lead Model: 5076
Implantable Pulse Generator Implant Date: 20180327
Lead Channel Impedance Value: 304 Ohm
Lead Channel Impedance Value: 323 Ohm
Lead Channel Impedance Value: 399 Ohm
Lead Channel Impedance Value: 437 Ohm
Lead Channel Pacing Threshold Amplitude: 0.5 V
Lead Channel Pacing Threshold Amplitude: 0.5 V
Lead Channel Pacing Threshold Pulse Width: 0.4 ms
Lead Channel Pacing Threshold Pulse Width: 0.4 ms
Lead Channel Sensing Intrinsic Amplitude: 2.125 mV
Lead Channel Sensing Intrinsic Amplitude: 2.125 mV
Lead Channel Sensing Intrinsic Amplitude: 23.5 mV
Lead Channel Sensing Intrinsic Amplitude: 23.5 mV
Lead Channel Setting Pacing Amplitude: 1.5 V
Lead Channel Setting Pacing Amplitude: 2 V
Lead Channel Setting Pacing Pulse Width: 0.4 ms
Lead Channel Setting Sensing Sensitivity: 1.2 mV
Zone Setting Status: 755011

## 2022-02-13 NOTE — Telephone Encounter (Signed)
LVM informing patients wife that transmission was received with normal device function.

## 2022-02-26 ENCOUNTER — Ambulatory Visit (HOSPITAL_COMMUNITY)
Admission: RE | Admit: 2022-02-26 | Discharge: 2022-02-26 | Disposition: A | Discharge status: Home / Self Care | Payer: Medicare PPO | Source: Ambulatory Visit | Attending: Nurse Practitioner | Admitting: Nurse Practitioner

## 2022-02-26 DIAGNOSIS — Z431 Encounter for attention to gastrostomy: Secondary | ICD-10-CM | POA: Insufficient documentation

## 2022-02-26 HISTORY — PX: IR GASTROSTOMY TUBE REMOVAL: IMG5492

## 2022-02-26 MED ORDER — LIDOCAINE VISCOUS HCL 2 % MT SOLN
OROMUCOSAL | Status: AC
  Administered 2022-02-26: 8 mL
  Filled 2022-02-26: qty 15

## 2022-02-26 NOTE — Procedures (Signed)
  Successful bedside removal of intact pull through G-tube. No immediate post procedural complications. Gauze dressing placed over site.  Pt wife provided with care instructions and verbalized understanding of care.  EBL <1cc  Tyson Alias, Rexford Maus 02/26/2022 2:49 PM

## 2022-02-28 ENCOUNTER — Ambulatory Visit (INDEPENDENT_AMBULATORY_CARE_PROVIDER_SITE_OTHER): Payer: Medicare PPO | Admitting: Urology

## 2022-02-28 DIAGNOSIS — R339 Retention of urine, unspecified: Secondary | ICD-10-CM

## 2022-02-28 DIAGNOSIS — N319 Neuromuscular dysfunction of bladder, unspecified: Secondary | ICD-10-CM | POA: Diagnosis not present

## 2022-02-28 NOTE — Progress Notes (Signed)
Remote pacemaker transmission.   

## 2022-02-28 NOTE — Progress Notes (Signed)
Suprapubic Cath Change  Patient is present today for a suprapubic catheter change due to urinary retention.  26m of water was drained from the balloon, a 20FR foley cath was removed from the tract with out difficulty.  Site was cleaned and prepped in a sterile fashion with betadine.  A 20FR foley cath was replaced into the tract no complications were noted. Urine return was noted, 10 ml of sterile water was inflated into the balloon and a bedside bag was attached for drainage.  Patient tolerated well.  Performed by: Tyshawn Keel LPN  Follow up: keep scheduled [place]

## 2022-03-07 ENCOUNTER — Inpatient Hospital Stay: Payer: Medicare PPO | Attending: Hematology

## 2022-03-07 ENCOUNTER — Ambulatory Visit (HOSPITAL_COMMUNITY)
Admission: RE | Admit: 2022-03-07 | Discharge: 2022-03-07 | Disposition: A | Discharge status: Home / Self Care | Payer: Medicare PPO | Source: Ambulatory Visit | Attending: Hematology | Admitting: Hematology

## 2022-03-07 DIAGNOSIS — E785 Hyperlipidemia, unspecified: Secondary | ICD-10-CM | POA: Insufficient documentation

## 2022-03-07 DIAGNOSIS — N2889 Other specified disorders of kidney and ureter: Secondary | ICD-10-CM | POA: Insufficient documentation

## 2022-03-07 DIAGNOSIS — R748 Abnormal levels of other serum enzymes: Secondary | ICD-10-CM | POA: Insufficient documentation

## 2022-03-07 DIAGNOSIS — Z931 Gastrostomy status: Secondary | ICD-10-CM | POA: Insufficient documentation

## 2022-03-07 DIAGNOSIS — R222 Localized swelling, mass and lump, trunk: Secondary | ICD-10-CM | POA: Insufficient documentation

## 2022-03-07 DIAGNOSIS — N319 Neuromuscular dysfunction of bladder, unspecified: Secondary | ICD-10-CM | POA: Diagnosis not present

## 2022-03-07 DIAGNOSIS — K219 Gastro-esophageal reflux disease without esophagitis: Secondary | ICD-10-CM | POA: Insufficient documentation

## 2022-03-07 DIAGNOSIS — Z8673 Personal history of transient ischemic attack (TIA), and cerebral infarction without residual deficits: Secondary | ICD-10-CM | POA: Insufficient documentation

## 2022-03-07 DIAGNOSIS — I129 Hypertensive chronic kidney disease with stage 1 through stage 4 chronic kidney disease, or unspecified chronic kidney disease: Secondary | ICD-10-CM | POA: Diagnosis not present

## 2022-03-07 DIAGNOSIS — G4733 Obstructive sleep apnea (adult) (pediatric): Secondary | ICD-10-CM | POA: Insufficient documentation

## 2022-03-07 DIAGNOSIS — Z79899 Other long term (current) drug therapy: Secondary | ICD-10-CM | POA: Insufficient documentation

## 2022-03-07 DIAGNOSIS — N183 Chronic kidney disease, stage 3 unspecified: Secondary | ICD-10-CM | POA: Insufficient documentation

## 2022-03-07 DIAGNOSIS — R911 Solitary pulmonary nodule: Secondary | ICD-10-CM | POA: Diagnosis not present

## 2022-03-07 DIAGNOSIS — I700 Atherosclerosis of aorta: Secondary | ICD-10-CM | POA: Diagnosis not present

## 2022-03-07 DIAGNOSIS — I480 Paroxysmal atrial fibrillation: Secondary | ICD-10-CM | POA: Diagnosis not present

## 2022-03-07 DIAGNOSIS — I252 Old myocardial infarction: Secondary | ICD-10-CM | POA: Diagnosis not present

## 2022-03-07 DIAGNOSIS — Z886 Allergy status to analgesic agent status: Secondary | ICD-10-CM | POA: Insufficient documentation

## 2022-03-07 DIAGNOSIS — E1122 Type 2 diabetes mellitus with diabetic chronic kidney disease: Secondary | ICD-10-CM | POA: Insufficient documentation

## 2022-03-07 LAB — CBC WITH DIFFERENTIAL/PLATELET
Abs Immature Granulocytes: 0.05 10*3/uL (ref 0.00–0.07)
Basophils Absolute: 0.1 10*3/uL (ref 0.0–0.1)
Basophils Relative: 1 %
Eosinophils Absolute: 0.4 10*3/uL (ref 0.0–0.5)
Eosinophils Relative: 6 %
HCT: 42.4 % (ref 39.0–52.0)
Hemoglobin: 13.8 g/dL (ref 13.0–17.0)
Immature Granulocytes: 1 %
Lymphocytes Relative: 28 %
Lymphs Abs: 1.9 10*3/uL (ref 0.7–4.0)
MCH: 29.2 pg (ref 26.0–34.0)
MCHC: 32.5 g/dL (ref 30.0–36.0)
MCV: 89.8 fL (ref 80.0–100.0)
Monocytes Absolute: 0.7 10*3/uL (ref 0.1–1.0)
Monocytes Relative: 10 %
Neutro Abs: 3.7 10*3/uL (ref 1.7–7.7)
Neutrophils Relative %: 54 %
Platelets: 296 10*3/uL (ref 150–400)
RBC: 4.72 MIL/uL (ref 4.22–5.81)
RDW: 14.5 % (ref 11.5–15.5)
WBC: 6.8 10*3/uL (ref 4.0–10.5)
nRBC: 0 % (ref 0.0–0.2)

## 2022-03-07 LAB — COMPREHENSIVE METABOLIC PANEL
ALT: 20 U/L (ref 0–44)
AST: 18 U/L (ref 15–41)
Albumin: 3.3 g/dL — ABNORMAL LOW (ref 3.5–5.0)
Alkaline Phosphatase: 134 U/L — ABNORMAL HIGH (ref 38–126)
Anion gap: 7 (ref 5–15)
BUN: 37 mg/dL — ABNORMAL HIGH (ref 8–23)
CO2: 28 mmol/L (ref 22–32)
Calcium: 9.3 mg/dL (ref 8.9–10.3)
Chloride: 103 mmol/L (ref 98–111)
Creatinine, Ser: 1.38 mg/dL — ABNORMAL HIGH (ref 0.61–1.24)
GFR, Estimated: 52 mL/min — ABNORMAL LOW (ref >60)
Glucose, Bld: 113 mg/dL — ABNORMAL HIGH (ref 70–99)
Potassium: 4.1 mmol/L (ref 3.5–5.1)
Sodium: 138 mmol/L (ref 135–145)
Total Bilirubin: 0.7 mg/dL (ref 0.3–1.2)
Total Protein: 7.4 g/dL (ref 6.5–8.1)

## 2022-03-07 MED ORDER — IOHEXOL 300 MG/ML  SOLN
100.0000 mL | Freq: Once | INTRAMUSCULAR | Status: AC | PRN
  Administered 2022-03-07: 100 mL via INTRAVENOUS

## 2022-03-14 ENCOUNTER — Encounter: Payer: Self-pay | Admitting: Hematology

## 2022-03-14 ENCOUNTER — Inpatient Hospital Stay (HOSPITAL_BASED_OUTPATIENT_CLINIC_OR_DEPARTMENT_OTHER): Payer: Medicare PPO | Admitting: Hematology

## 2022-03-14 VITALS — BP 132/66 | HR 58 | Temp 98.0°F | Resp 18 | Wt 151.6 lb

## 2022-03-14 DIAGNOSIS — N2889 Other specified disorders of kidney and ureter: Secondary | ICD-10-CM

## 2022-03-14 NOTE — Progress Notes (Signed)
[facility] [address], [place] [address]    Clinic Day:  03/14/2022  Referring physician: Alvira Monday, FNP  Patient Care Team: Alvira Monday, [place] as PCP - General (Family Medicine) Dylan Jack, MD as Medical Oncologist (Medical Oncology) Brien Mates, RN as Oncology Nurse Navigator (Medical Oncology)   ASSESSMENT & PLAN:   Assessment: 1.  Bilateral kidney masses: - Patient seen at the request of Dr. Felipa Eth for further evaluation of kidney masses. - CT abdomen with and without contrast (08/09/2021): 4.1 x 4 x 4.6 cm mass in the posterior lower interpolar right kidney, extending into the central sinus fat.  Second tiny enhancing lesion in the anterior lower pole of the right kidney measuring 8 mm.  2.1 x 2.3 x 2 cm heterogeneously enhancing mass in the posterior interpolar left kidney.  10 x 6 mm left lower lobe lung nodule nonspecific. - Dr. Felipa Eth did not think he was a surgical candidate. - PET scan (09/07/2021): 4.3 cm right kidney mass, SUV 4.4.  8 mm enhancing lesion in the lower pole of the right kidney, below PET resolution.  2.2 cm lesion in the posterior interpolar left kidney with no abnormal hypermetabolic some.  No abnormal uptake in the bones.  2.  Social/family history: - Patient resident at [facility] since 05/04/2021.  He requires full care.  He had a history of hemorrhagic stroke and neurogenic bladder.  His wife visits him at the nursing home per the attendant.  He used to build homes prior to retirement.  Non-smoker. - Family history consistent with brother with cancer  Plan: 1.  Right kidney masses: - He is at [facility]. - He had G-tube removed recently and is completely eating by mouth. - Reviewed labs from 03/07/2022: Creatinine 1.38 and stable.  LFTs are normal with elevated alk phos stable.  CBC was normal. - CT AP (03/07/2022): Right kidney mass measures 4.3 x 4.3 cm (4.1 x 4.0  cm).  Right renal lesion along the anterior margin of the right kidney 9 mm, previously 8 mm.  No new lesions seen.  Ablation changes in the lower pole of the left kidney seen. - He has a very mild increase in size of the renal lesions. - Previously we have discussed about ablation but decided not to pursue it due to his poor functional status.  We are doing observation at this time. - Recommend RTC 6 months with repeat CTAP.    Orders Placed This Encounter  Procedures   CT Abdomen Pelvis W Contrast    Standing Status:   Future    Standing Expiration Date:   03/14/2023    Order Specific Question:   If indicated for the ordered procedure, I authorize the administration of contrast media per Radiology protocol    Answer:   Yes    Order Specific Question:   Does the patient have a contrast media/X-ray dye allergy?    Answer:   No    Order Specific Question:   Preferred imaging location?    Answer:   [facility]    Order Specific Question:   Release to patient    Answer:   Immediate [1]    Order Specific Question:   Is Oral Contrast requested for this exam?    Answer:   No oral contrast    Order Specific Question:   Reason for No Oral Contrast    Answer:   NPO   CBC  with Differential/Platelet    Standing Status:   Future    Standing Expiration Date:   03/15/2023    Order Specific Question:   Release to patient    Answer:   Immediate   Comprehensive metabolic panel    Standing Status:   Future    Standing Expiration Date:   03/15/2023    Order Specific Question:   Release to patient    Answer:   Immediate      Kaleen Odea as a scribe for Dylan Jack, MD.,have documented all relevant documentation on the behalf of Dylan Jack, MD,as directed by  Dylan Jack, MD while in the presence of Dylan Jack, MD.   I, Dylan Jack MD, have reviewed the above documentation for accuracy and completeness, and I agree with the  above.   Doyce Loose   2/22/20242:48 PM  CHIEF COMPLAINT:   Diagnosis:  bilateral kidney masses.    Cancer Staging  No matching staging information was found for the patient.   Prior Therapy: Left kidney mass ablation few years ago   Current Therapy:   Observation    HISTORY OF PRESENT ILLNESS:   Oncology History   No history exists.     INTERVAL HISTORY:   Dylan Martin is a 77 y.o. male presenting to clinic today for follow up of  bilateral kidney masses. Marland Kitchen He was last seen by me on 09/12/2021.  Today, he states that he is doing well overall. His appetite level is at 100%. His energy level is at 0%.His G-tube has been removed. He has been able to eat orally since 11/2020. He has  suprapubic catheter for his bladder.     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[place])    Aphasia following cerebral infarction    Atherosclerotic heart disease of native coronary artery without angina pectoris    Benign prostatic hyperplasia with lower urinary tract symptoms    Benign prostatic hyperplasia without lower urinary tract symptoms    Bradycardia, unspecified    Cerebral infarction (HCC)    Chronic kidney disease, stage 3 unspecified (HCC)    Cognitive communication deficit    Dysarthria    Dysphagia    Encephalopathy    Essential (primary) hypertension    Gastro-esophageal reflux disease without esophagitis    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HCC)    Hemiplegia (HCC)    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)    Hyperlipidemia    Hyperosmolality and hypernatremia    Muscle weakness (generalized)    Myocardial infarct, old    Neuromuscular dysfunction of bladder, unspecified    Nontraumatic intracerebral hemorrhage (HCC)    Nontraumatic intracerebral hemorrhage (HCC)    Obstructive sleep apnea    Paroxysmal atrial fibrillation (HCC)     Presence of cardiac pacemaker    Presence of coronary angioplasty implant and graft    Pressure ulcer    Renovascular hypertension    Retention of urine    Seizures (HCC)    TIA (transient ischemic attack)    Type 2 diabetes mellitus without complication (HCC)    Unspecified atrial fibrillation (HCC)    Unspecified disorder of synovium and tendon, right shoulder    Unspecified lack of coordination    Weakness     Surgical History: Past Surgical History:  Procedure Laterality Date   FEMUR SURGERY Left    HIP SURGERY Left  IR GASTROSTOMY TUBE REMOVAL  02/26/2022   SPINAL CORD STIMULATOR IMPLANT     per wife nerve stimulator in back    Social History: Social History   Socioeconomic History   Marital status: Married    Spouse name: Not on file   Number of children: Not on file   Years of education: Not on file   Highest education level: Not on file  Occupational History   Not on file  Tobacco Use   Smoking status: Never   Smokeless tobacco: Never  Vaping Use   Vaping Use: Never used  Substance and Sexual Activity   Alcohol use: Not Currently   Drug use: Never   Sexual activity: Not on file  Other Topics Concern   Not on file  Social History Narrative   Not on file   Social Determinants of Health   Financial Resource Strain: Not on file  Food Insecurity: Not on file  Transportation Needs: Not on file  Physical Activity: Not on file  Stress: Not on file  Social Connections: Not on file  Intimate Partner Violence: Not on file    Family History: History reviewed. No pertinent family history.  Current Medications:  Current Outpatient Medications:    acetaminophen (TYLENOL) 325 MG tablet, Take 650 mg by mouth every 4 (four) hours as needed for mild pain or fever., Disp: , Rfl:    amLODipine (NORVASC) 10 MG tablet, Take 10 mg by mouth daily., Disp: , Rfl:    Ascorbic Acid (VITAMIN C) 500 MG CAPS, Take 1,000 mg by mouth daily., Disp: , Rfl:    atorvastatin  (LIPITOR) 80 MG tablet, Take 80 mg by mouth at bedtime., Disp: , Rfl:    bisacodyl (DULCOLAX) 10 MG suppository, Place 10 mg rectally daily as needed for moderate constipation., Disp: , Rfl:    cyclobenzaprine (FLEXERIL) 5 MG tablet, Take 5 mg by mouth every 8 (eight) hours as needed for muscle spasms., Disp: , Rfl:    fexofenadine (ALLEGRA) 180 MG tablet, Take 180 mg by mouth daily as needed for allergies or rhinitis., Disp: , Rfl:    FLUoxetine (PROZAC) 20 MG capsule, Take 20 mg by mouth daily., Disp: , Rfl:    hydrALAZINE (APRESOLINE) 25 MG tablet, Take 25 mg by mouth every 8 (eight) hours., Disp: , Rfl:    lisinopril (ZESTRIL) 40 MG tablet, Take 40 mg by mouth daily., Disp: , Rfl:    loperamide (IMODIUM A-D) 2 MG tablet, Take 2-4 mg by mouth See admin instructions. Take 4 mg every 12 hours after 1st loose stool, take 2 mgs every 6 hours as needed for diarrhea max 4 tabs in 24 hours, Disp: , Rfl:    melatonin 5 MG TABS, Take 5 mg by mouth at bedtime., Disp: , Rfl:    metoprolol succinate (TOPROL-XL) 50 MG 24 hr tablet, Take 50 mg by mouth 2 (two) times daily., Disp: , Rfl:    nitroGLYCERIN (NITROSTAT) 0.4 MG SL tablet, Place 0.4 mg under the tongue every 5 (five) minutes as needed for chest pain., Disp: , Rfl:    NON FORMULARY, Take 1 Dose by mouth 2 (two) times daily. Magic Cups, Disp: , Rfl:    Nutritional Supplement LIQD, Take 120 mLs by mouth 2 (two) times daily. House Supplement, Disp: , Rfl:    nystatin-triamcinolone ointment (MYCOLOG), Apply 1 Application topically 2 (two) times daily., Disp: 30 g, Rfl: 0   omeprazole (PRILOSEC) 20 MG capsule, Take 20 mg by mouth daily., Disp: ,  Rfl:    ondansetron (ZOFRAN) 4 MG tablet, Take 4 mg by mouth every 12 (twelve) hours as needed for vomiting or nausea., Disp: , Rfl:    sodium bicarbonate 650 MG tablet, Take 650 mg by mouth daily., Disp: , Rfl:    thiamine 100 MG tablet, Take 100 mg by mouth daily., Disp: , Rfl:    ZINC SULFATE PO, Take 1 tablet  by mouth daily., Disp: , Rfl:    Allergies: Allergies  Allergen Reactions   Aspirin Other (See Comments)    dr told pt not to take ASA after aneurysm   Other     Blood thinners- wife wants documented that patient should not be on blood thinners due to past medical history  Red meat- causes gout    Shrimp (Diagnostic)     Gout flare    REVIEW OF SYSTEMS:   Review of Systems  Constitutional:  Negative for chills, fatigue and fever.  HENT:   Negative for lump/mass, mouth sores, nosebleeds, sore throat and trouble swallowing.   Eyes:  Negative for eye problems.  Respiratory:  Negative for cough and shortness of breath.   Cardiovascular:  Negative for chest pain, leg swelling and palpitations.  Gastrointestinal:  Negative for abdominal pain, constipation, diarrhea, nausea and vomiting.  Genitourinary:  Positive for difficulty urinating (Cath). Negative for bladder incontinence, dysuria, frequency, hematuria and nocturia.   Musculoskeletal:  Negative for arthralgias, back pain, flank pain, myalgias and neck pain.  Skin:  Negative for itching and rash.  Neurological:  Negative for dizziness, headaches and numbness.  Hematological:  Does not bruise/bleed easily.  Psychiatric/Behavioral:  Negative for depression, sleep disturbance and suicidal ideas. The patient is not nervous/anxious.   All other systems reviewed and are negative.    VITALS:   Blood pressure 132/66, pulse (!) 58, temperature 98 F (36.7 C), temperature source Oral, resp. rate 18, weight 151 lb 9.6 oz (68.8 kg), SpO2 96 %.  Wt Readings from Last 3 Encounters:  03/14/22 151 lb 9.6 oz (68.8 kg)  09/12/21 157 lb (71.2 kg)  09/07/21 153 lb (69.4 kg)    Body mass index is 21.14 kg/m.  Performance status (ECOG): 3 - Symptomatic, >50% confined to bed  PHYSICAL EXAM:   Physical Exam Vitals and nursing note reviewed. Exam conducted with a chaperone present.  Constitutional:      Appearance: Normal appearance.   Cardiovascular:     Rate and Rhythm: Normal rate and regular rhythm.     Pulses: Normal pulses.     Heart sounds: Normal heart sounds.  Pulmonary:     Effort: Pulmonary effort is normal.     Breath sounds: Normal breath sounds.  Abdominal:     Palpations: Abdomen is soft. There is no hepatomegaly, splenomegaly or mass.     Tenderness: There is no abdominal tenderness.  Musculoskeletal:     Right lower leg: No edema.     Left lower leg: No edema.  Lymphadenopathy:     Cervical: No cervical adenopathy.     Right cervical: No superficial, deep or posterior cervical adenopathy.    Left cervical: No superficial, deep or posterior cervical adenopathy.     Upper Body:     Right upper body: No supraclavicular or axillary adenopathy.     Left upper body: No supraclavicular or axillary adenopathy.  Neurological:     General: No focal deficit present.     Mental Status: He is alert and oriented to person, place, and time.  Psychiatric:        Mood and Affect: Mood normal.        Behavior: Behavior normal.     LABS:      Latest Ref Rng & Units 03/07/2022   11:58 AM 08/28/2021    9:14 AM 07/16/2021    8:18 AM  CBC  WBC 4.0 - 10.5 K/uL 6.8  8.6  8.3   Hemoglobin 13.0 - 17.0 g/dL 13.8  13.2  13.2   Hematocrit 39.0 - 52.0 % 42.4  40.4  40.2   Platelets 150 - 400 K/uL 296  281  316       Latest Ref Rng & Units 03/07/2022   11:58 AM 08/28/2021    9:14 AM 07/16/2021    8:18 AM  CMP  Glucose 70 - 99 mg/dL 113  104  105   BUN 8 - 23 mg/dL 37  29  11   Creatinine 0.61 - 1.24 mg/dL 1.38  1.50  1.12   Sodium 135 - 145 mmol/L 138  136  140   Potassium 3.5 - 5.1 mmol/L 4.1  3.9  3.6   Chloride 98 - 111 mmol/L 103  101  102   CO2 22 - 32 mmol/L 28  29  21   $ Calcium 8.9 - 10.3 mg/dL 9.3  9.5  9.7   Total Protein 6.5 - 8.1 g/dL 7.4  7.2  6.7   Total Bilirubin 0.3 - 1.2 mg/dL 0.7  0.6  0.6   Alkaline Phos 38 - 126 U/L 134  140  115   AST 15 - 41 U/L 18  14  11   $ ALT 0 - 44 U/L 20  11  7       $ No results found for: "CEA1", "CEA" / No results found for: "CEA1", "CEA" Lab Results  Component Value Date   PSA1 2.2 07/16/2021   No results found for: "[id number]" No results found for: "CAN125"  No results found for: "TOTALPROTELP", "ALBUMINELP", "A1GS", "A2GS", "BETS", "BETA2SER", "GAMS", "MSPIKE", "SPEI" No results found for: "TIBC", "FERRITIN", "IRONPCTSAT" Lab Results  Component Value Date   LDH 136 08/28/2021     STUDIES:   CT Abdomen Pelvis W Wo Contrast  Result Date: 03/08/2022 CLINICAL DATA:  77 year old male presents for evaluation of renal mass, history of renal cell carcinoma. * Tracking Code: BO * EXAM: CT ABDOMEN AND PELVIS WITHOUT AND WITH CONTRAST TECHNIQUE: Multidetector CT imaging of the abdomen and pelvis was performed following the standard protocol before and following the bolus administration of intravenous contrast. RADIATION DOSE REDUCTION: This exam was performed according to the departmental dose-optimization program which includes automated exposure control, adjustment of the mA and/or kV according to patient size and/or use of iterative reconstruction technique. CONTRAST:  161m OMNIPAQUE IOHEXOL 300 MG/ML  SOLN COMPARISON:  July 20, 2021 FINDINGS: Lower chest: Cardiac pacer device in place. Signs of coronary artery disease. Patchy basilar atelectasis. No consolidation. No pleural effusion. No chest wall abnormality. Heart size is moderately enlarged. Hepatobiliary: No focal, suspicious hepatic lesion. No pericholecystic stranding. No biliary duct dilation. Portal vein is patent. Pancreas: Mild atrophy of the pancreas without ductal dilation, inflammation or visible lesion. Spleen: Normal. Adrenals/Urinary Tract: Adrenal glands are normal. LEFT kidney: Scarring about the LEFT kidney suggestive of prior ablation procedure in the lower pole. 9 mm area arising from the lower pole of the LEFT kidney unchanged from previous imaging with streak artifact limiting  assessment for enhancement but with suggestion of slight increased density  following contrast administration. (Image 55/7) 2.2 x 2.2 cm solid renal neoplasm in the interpolar LEFT kidney with heterogeneous enhancement. No additional suspicious or equivocal renal lesions on the LEFT. No hydronephrosis or perinephric stranding. RIGHT kidney: Large interpolar RIGHT renal mass measuring 4.3 x 4.3 cm as compared to 4.1 x 4.0 cm on the study from July of 2023. This mass measures as large as 4.8 cm as compared to 4.6 cm greatest craniocaudal dimension. Heterogeneous enhancement as on previous imaging within the mass. No current signs of renal venous invasion. Mass is situated in the interpolar RIGHT kidney and tracks below the RIGHT hilar line in the posterolateral kidney. Mass abuts hilar vessels at periphery of the RIGHT renal hilum and infundibular collecting system elements. Small enhancing RIGHT renal lesion along the anterior margin of the RIGHT kidney at 9 mm previously approximately 8 mm. No additional suspicious renal lesions on the RIGHT. Streak artifact on today's study arising from a spinal nerve stimulator power pack does mildly limit assessment. No signs of ureteral dilation. Suprapubic tube in place decompresses the urinary bladder. Stomach/Bowel: Large volume stool in the rectum 8.0 x 9.3 cm. Very mild rectal wall thickening accounting for degree of distension is suspected on today's study. There is no gross sign of upstream obstruction in there is colonic diverticulosis. Stomach without signs of adjacent stranding. Small bowel without signs of obstruction or inflammation. Vascular/Lymphatic: Aortic atherosclerosis. No sign of aneurysm. Smooth contour of the IVC. There is no gastrohepatic or hepatoduodenal ligament lymphadenopathy. No retroperitoneal or mesenteric lymphadenopathy. No pelvic sidewall lymphadenopathy. Atherosclerotic changes are moderate. Pelvic structures particularly on the LEFT limited by  changes of LEFT hip arthroplasty and resultant streak artifact. Reproductive: Grossly unremarkable by CT though not well assessed due to streak artifact in the pelvis. Position of the patient's LEFT leg does not allow for assessment of the arthroplasty changes. Other: No ascites or pneumoperitoneum. Musculoskeletal: Spinal nerve stimulator terminating in the lower thoracic spine. Post LEFT hip arthroplasty, incompletely assessed. No acute bone finding or destructive bone process. Spinal degenerative changes. IMPRESSION: 1. Slight enlargement large RIGHT renal mass with heterogeneous enhancement remaining highly suspicious for renal cell carcinoma. 2. Smaller renal neoplasms seen bilaterally in the anterior RIGHT kidney and the interpolar LEFT kidney as discussed. 3. Small cystic area adjacent to ablation changes in the lower pole of the LEFT kidney with question of enhancement though streak artifact limits assessment in this area. Attention on subsequent imaging. This area is not changed since previous imaging. 4. Large volume stool in the rectum 8.0 x 9.3 cm. Likely related to fecal impaction. Mild thickening of the rectum suggests the possibility of early stercoral proctitis. 5. Suprapubic tube in place decompresses the urinary bladder. 6. Aortic atherosclerosis. Aortic Atherosclerosis (ICD10-I70.0). Electronically Signed   By: Zetta Bills M.D.   On: 03/08/2022 09:10   IR GASTROSTOMY TUBE REMOVAL  Result Date: 02/26/2022 INDICATION: Patient history of CVA. Request received for gastrostomy tube removal. EXAM: BEDSIDE REMOVAL OF GASTROSTOMY TUBE COMPARISON:  None Available. MEDICATIONS: Viscous lidocaine 10 mL CONTRAST:  None FLUOROSCOPY TIME:  None COMPLICATIONS: None immediate. PROCEDURE: Informed written consent was obtained from the patient's wife following explanation of the procedure, risks, benefits and alternatives. A time-out was performed prior to the initiation of the procedure. The track of the  existing gastrostomy tube was lubricated with viscous lidocaine. Using manual traction, the existing pull-through gastrostomy tube was removed intact. Pressure was held at the site until hemostasis was achieved. A dressing was placed.  The patient tolerated the procedure well without immediate postprocedural complication. IMPRESSION: Successful bedside removal of pull-through gastrostomy tube without complication. Read by: Narda Rutherford, AGNP-BC Electronically Signed   By: Aletta Edouard M.D.   On: 02/26/2022 14:55   CUP PACEART REMOTE DEVICE CHECK  Result Date: 02/13/2022 Scheduled remote reviewed. Normal device function.  1 NSVT, 6 beats Next remote 91 days. LA

## 2022-03-14 NOTE — Patient Instructions (Signed)
[facility]  Discharge Instructions  You were seen and examined today by Dr. Delton Coombes.  Dr. Delton Coombes discussed your most recent lab work and CT scan which revealed that everything looks good and stable other than the spot on the kidney has slightly grown by a few millimeters.  Dr. Delton Coombes is going to repeat scan before next appointment.  Follow-up as scheduled in 6 months with labs and scan prior.    Thank you for choosing [facility] to provide your oncology and hematology care.   To afford each patient quality time with our provider, please arrive at least 15 minutes before your scheduled appointment time. You may need to reschedule your appointment if you arrive late (10 or more minutes). Arriving late affects you and other patients whose appointments are after yours.  Also, if you miss three or more appointments without notifying the office, you may be dismissed from the clinic at the provider's discretion.    Again, thank you for choosing [facility].  Our hope is that these requests will decrease the amount of time that you wait before being seen by our physicians.   If you have a lab appointment with the [facility] please come in thru the Main Entrance and check in at the main information desk.           _____________________________________________________________  Should you have questions after your visit to [facility], please contact our office at [phone] and follow the prompts.  Our office hours are 8:00 a.m. to 4:30 p.m. Monday - Thursday and 8:00 a.m. to 2:30 p.m. Friday.  Please note that voicemails left after 4:00 p.m. may not be returned until the following business day.  We are closed weekends and all major holidays.  You do have access to a nurse 24-7, just call the main number to the clinic [phone] and do not press any options, hold on the line and a nurse will answer  the phone.    For prescription refill requests, have your pharmacy contact our office and allow 72 hours.    Masks are optional in the cancer centers. If you would like for your care team to wear a mask while they are taking care of you, please let them know. You may have one support person who is at least 79 years old accompany you for your appointments.

## 2022-03-28 ENCOUNTER — Ambulatory Visit: Payer: Medicare PPO

## 2022-04-04 ENCOUNTER — Ambulatory Visit: Payer: Medicare PPO

## 2022-04-04 ENCOUNTER — Ambulatory Visit (INDEPENDENT_AMBULATORY_CARE_PROVIDER_SITE_OTHER): Payer: Medicare PPO | Admitting: Urology

## 2022-04-04 DIAGNOSIS — N319 Neuromuscular dysfunction of bladder, unspecified: Secondary | ICD-10-CM | POA: Diagnosis not present

## 2022-04-04 DIAGNOSIS — R339 Retention of urine, unspecified: Secondary | ICD-10-CM

## 2022-04-04 NOTE — Progress Notes (Signed)
Suprapubic Cath Change  Patient is present today for a suprapubic catheter change due to urinary retention.  67m of water was drained from the balloon, a 20FR foley cath was removed from the tract with out difficulty.  Site was cleaned and prepped in a sterile fashion with betadine.  A 20FR foley cath was replaced into the tract no complications were noted. 564mflush  return was noted, 10 ml of sterile water was inflated into the balloon and a overnight bag was attached for drainage.  Patient tolerated well. A night bag was given to patient and proper instruction was given on how to switch bags.    Performed by: ShMarisue BrooklynCMA  Follow up: Keep NV

## 2022-05-06 ENCOUNTER — Ambulatory Visit (INDEPENDENT_AMBULATORY_CARE_PROVIDER_SITE_OTHER): Payer: Medicare PPO | Admitting: Urology

## 2022-05-06 DIAGNOSIS — N319 Neuromuscular dysfunction of bladder, unspecified: Secondary | ICD-10-CM

## 2022-05-06 DIAGNOSIS — R339 Retention of urine, unspecified: Secondary | ICD-10-CM

## 2022-05-06 NOTE — Progress Notes (Signed)
Suprapubic Cath Change  Patient is present today for a suprapubic catheter change due to urinary retention.  11ml of water was drained from the balloon, a 20FR foley cath was removed from the tract with out difficulty.  Site was cleaned and prepped in a sterile fashion with betadine.  A 20FR foley cath was replaced into the tract no complications were noted. Urine return was noted, 10 ml of sterile water was inflated into the balloon and a leg bag was attached for drainage.  Patient tolerated well. A night bag was given to patient and proper instruction was given on how to switch bags.    Performed by: Jeoffrey Eleazer LPN  Follow up: Keep scheduled NV

## 2022-05-09 ENCOUNTER — Ambulatory Visit (INDEPENDENT_AMBULATORY_CARE_PROVIDER_SITE_OTHER): Payer: Medicare PPO

## 2022-05-09 DIAGNOSIS — I495 Sick sinus syndrome: Secondary | ICD-10-CM | POA: Diagnosis not present

## 2022-05-09 LAB — CUP PACEART REMOTE DEVICE CHECK
Battery Remaining Longevity: 52 mo
Battery Voltage: 2.95 V
Brady Statistic AP VP Percent: 88.67 %
Brady Statistic AP VS Percent: 0.02 %
Brady Statistic AS VP Percent: 10.4 %
Brady Statistic AS VS Percent: 0.91 %
Brady Statistic RA Percent Paced: 88.9 %
Brady Statistic RV Percent Paced: 99.07 %
Date Time Interrogation Session: 20240418012735
Implantable Lead Connection Status: 753985
Implantable Lead Connection Status: 753985
Implantable Lead Implant Date: 20180327
Implantable Lead Implant Date: 20180327
Implantable Lead Location: 753859
Implantable Lead Location: 753860
Implantable Lead Model: 5076
Implantable Lead Model: 5076
Implantable Pulse Generator Implant Date: 20180327
Lead Channel Impedance Value: 285 Ohm
Lead Channel Impedance Value: 304 Ohm
Lead Channel Impedance Value: 380 Ohm
Lead Channel Impedance Value: 380 Ohm
Lead Channel Pacing Threshold Amplitude: 0.5 V
Lead Channel Pacing Threshold Amplitude: 0.625 V
Lead Channel Pacing Threshold Pulse Width: 0.4 ms
Lead Channel Pacing Threshold Pulse Width: 0.4 ms
Lead Channel Sensing Intrinsic Amplitude: 2.25 mV
Lead Channel Sensing Intrinsic Amplitude: 2.25 mV
Lead Channel Sensing Intrinsic Amplitude: 23.5 mV
Lead Channel Sensing Intrinsic Amplitude: 23.5 mV
Lead Channel Setting Pacing Amplitude: 1.5 V
Lead Channel Setting Pacing Amplitude: 2 V
Lead Channel Setting Pacing Pulse Width: 0.4 ms
Lead Channel Setting Sensing Sensitivity: 1.2 mV
Zone Setting Status: 755011

## 2022-05-20 ENCOUNTER — Telehealth: Payer: Self-pay | Admitting: Internal Medicine

## 2022-05-20 NOTE — Telephone Encounter (Signed)
Spouse called to check to see when the last transmission reading was due to her walking into the rehab facility and found the pt pace maker machine unplugged and turned around. She was not happy about it so she just needed to know the last reading to help her find out how long its been unplugged because he needs it so I never should've been unplugged. She hadn't been to the rehab facility in 5 days due to Covid procedures and this happened. She just wanted to keep record that she called in about it to let the office know.

## 2022-05-20 NOTE — Telephone Encounter (Signed)
Wife returned RN's call. 

## 2022-05-20 NOTE — Telephone Encounter (Signed)
Attempted to return call. No answer, LMTCB.  Unable to see how long monitor was unplugged. I do see scheduled remote was completed on 05/09/22.   When returns call, could someone please advise.

## 2022-05-20 NOTE — Telephone Encounter (Signed)
Monitor do not send a transmission every single day. The monitor is fine. I let the patient wife know that the monitor is fine.

## 2022-05-21 ENCOUNTER — Telehealth: Payer: Self-pay | Admitting: *Deleted

## 2022-05-21 NOTE — Telephone Encounter (Signed)
Patient's wife [name] called and asked to speak with Dr. Vassie Loll about sending an order for rehab facility to keep his bipap mask on. Patient is currently in rehab at [facility]   Please call and advise patient's spouse. [phone]

## 2022-05-22 NOTE — Telephone Encounter (Signed)
I called and spoke with the pt's spouse  She thinks that the pt is not using his BIPAP all night  She wants [place] to fax order to [facility] stating pt must keep BIPAP mask on any time he's asleep  She did not have a fax number  I called the Director of Nursing- Crystal and had to [facility]- phone for her is [phone]

## 2022-05-22 NOTE — Telephone Encounter (Signed)
Called the pt's spouse and there was no answer- LMTCB.  

## 2022-05-22 NOTE — Telephone Encounter (Signed)
Returning missed call

## 2022-05-31 NOTE — Telephone Encounter (Signed)
Spoke to [employer] with [employer] and relayed below message. She stated that letter could be sent to fax number [phone]. Letter sent.  Lm for patient's spouse.

## 2022-05-31 NOTE — Telephone Encounter (Signed)
Patient's spouse, Elizabeth(DPR) is aware of below message and voiced her understanding.  Nothing further needed.

## 2022-06-06 ENCOUNTER — Ambulatory Visit: Payer: Medicare PPO

## 2022-06-10 ENCOUNTER — Ambulatory Visit: Payer: Medicare PPO

## 2022-06-10 NOTE — Progress Notes (Signed)
Remote pacemaker transmission.   

## 2022-06-20 ENCOUNTER — Ambulatory Visit (INDEPENDENT_AMBULATORY_CARE_PROVIDER_SITE_OTHER): Payer: Medicare PPO

## 2022-06-20 DIAGNOSIS — N319 Neuromuscular dysfunction of bladder, unspecified: Secondary | ICD-10-CM

## 2022-06-20 DIAGNOSIS — R339 Retention of urine, unspecified: Secondary | ICD-10-CM

## 2022-06-20 NOTE — Progress Notes (Addendum)
Suprapubic Cath Change  Patient is present today for a suprapubic catheter change due to urinary retention.  10 ml of water was drained from the balloon, a 20 FR foley cath was removed from the tract with out difficulty.  Site was cleaned and prepped in a sterile fashion with betadine.  A 20 FR foley cath was replaced into the tract no complications were noted. Urine return was noted, 10 ml of sterile water was inflated into the balloon and a overnight bag was attached for drainage.  Patient tolerated well. A night bag was given to patient and proper instruction was given on how to switch bags.    Performed by: Lorette Ang  Follow up: No follow-ups on file.

## 2022-07-04 ENCOUNTER — Ambulatory Visit (INDEPENDENT_AMBULATORY_CARE_PROVIDER_SITE_OTHER): Payer: Medicare PPO

## 2022-07-04 DIAGNOSIS — R339 Retention of urine, unspecified: Secondary | ICD-10-CM

## 2022-07-04 DIAGNOSIS — N319 Neuromuscular dysfunction of bladder, unspecified: Secondary | ICD-10-CM

## 2022-07-04 NOTE — Progress Notes (Signed)
Suprapubic Cath Change  Patient is present today for a suprapubic catheter change due to urinary retention.  10 ml of water was drained from the balloon, a 20 FR foley cath was removed from the tract with out difficulty.  Site was cleaned and prepped in a sterile fashion with betadine.  A 20 FR foley cath was replaced into the tract no complications were noted. Flush return was noted, 10 ml of sterile water was inflated into the balloon and a leg bag was attached for drainage.  Patient tolerated well. A night bag was given to patient and proper instruction was given on how to switch bags.    Performed by: Kennyth Lose, CMA  Follow up: No follow-ups on file.

## 2022-08-01 ENCOUNTER — Ambulatory Visit (INDEPENDENT_AMBULATORY_CARE_PROVIDER_SITE_OTHER): Payer: Medicare PPO

## 2022-08-01 DIAGNOSIS — N319 Neuromuscular dysfunction of bladder, unspecified: Secondary | ICD-10-CM | POA: Diagnosis not present

## 2022-08-01 NOTE — Progress Notes (Signed)
Suprapubic Cath Change  Patient is present today for a suprapubic catheter change due to urinary retention.  8ml of water was drained from the balloon, a 20FR foley cath was removed from the tract with out difficulty.  Site was cleaned and prepped in a sterile fashion with betadine.  A 20FR foley cath was replaced into the tract no complications were noted. Urine return was noted, 10 ml of sterile water was inflated into the balloon and a bedside bag was attached for drainage.  Patient tolerated well.     Performed by: Aleric Froelich LPN  Follow up: keep scheduled NV

## 2022-08-06 ENCOUNTER — Ambulatory Visit: Payer: Medicare PPO | Attending: Internal Medicine | Admitting: Internal Medicine

## 2022-08-06 VITALS — BP 120/80 | HR 60 | Wt 157.0 lb

## 2022-08-06 DIAGNOSIS — I4891 Unspecified atrial fibrillation: Secondary | ICD-10-CM

## 2022-08-06 LAB — CUP PACEART INCLINIC DEVICE CHECK
Battery Remaining Longevity: 49 mo
Battery Voltage: 2.95 V
Brady Statistic AP VP Percent: 89.01 %
Brady Statistic AP VS Percent: 0.01 %
Brady Statistic AS VP Percent: 10.05 %
Brady Statistic AS VS Percent: 0.93 %
Brady Statistic RA Percent Paced: 89.51 %
Brady Statistic RV Percent Paced: 99.06 %
Date Time Interrogation Session: 20240716125252
Implantable Lead Connection Status: 753985
Implantable Lead Connection Status: 753985
Implantable Lead Implant Date: 20180327
Implantable Lead Implant Date: 20180327
Implantable Lead Location: 753859
Implantable Lead Location: 753860
Implantable Lead Model: 5076
Implantable Lead Model: 5076
Implantable Pulse Generator Implant Date: 20180327
Lead Channel Impedance Value: 304 Ohm
Lead Channel Impedance Value: 323 Ohm
Lead Channel Impedance Value: 380 Ohm
Lead Channel Impedance Value: 418 Ohm
Lead Channel Pacing Threshold Amplitude: 0.5 V
Lead Channel Pacing Threshold Amplitude: 0.625 V
Lead Channel Pacing Threshold Pulse Width: 0.4 ms
Lead Channel Pacing Threshold Pulse Width: 0.4 ms
Lead Channel Sensing Intrinsic Amplitude: 2 mV
Lead Channel Sensing Intrinsic Amplitude: 2.25 mV
Lead Channel Sensing Intrinsic Amplitude: 23.5 mV
Lead Channel Sensing Intrinsic Amplitude: 23.5 mV
Lead Channel Setting Pacing Amplitude: 1.5 V
Lead Channel Setting Pacing Amplitude: 2 V
Lead Channel Setting Pacing Pulse Width: 0.4 ms
Lead Channel Setting Sensing Sensitivity: 1.2 mV
Zone Setting Status: 755011

## 2022-08-06 NOTE — Progress Notes (Signed)
HPI [name] is a 77 yo man who returns today for ongoing device management. 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 He is contracted in the legs. He is wheelchair bound.  Allergies  Allergen Reactions   Aspirin Other (See Comments)    dr told pt not to take ASA after aneurysm   Other     Blood thinners- wife wants documented that patient should not be on blood thinners due to past medical history  Red meat- causes gout    Shrimp (Diagnostic)     Gout flare     Current Outpatient Medications  Medication Sig Dispense Refill   acetaminophen (TYLENOL) 325 MG tablet Take 650 mg by mouth every 4 (four) hours as needed for mild pain or fever.     amLODipine (NORVASC) 10 MG tablet Take 10 mg by mouth daily.     Ascorbic Acid (VITAMIN C) 500 MG CAPS Take 1,000 mg by mouth daily.     bisacodyl (DULCOLAX) 10 MG suppository Place 10 mg rectally daily as needed for moderate constipation.     cyclobenzaprine (FLEXERIL) 5 MG tablet Take 5 mg by mouth every 8 (eight) hours as needed for muscle spasms.     famotidine (PEPCID) 20 MG tablet Take 20 mg by mouth daily.     fexofenadine (ALLEGRA) 180 MG tablet Take 180 mg by mouth daily as needed for allergies or rhinitis.     FLUoxetine (PROZAC) 20 MG capsule Take 20 mg by mouth daily.     hydrALAZINE (APRESOLINE) 25 MG tablet Take 25 mg by mouth every 8 (eight) hours.     lisinopril (ZESTRIL) 40 MG tablet Take 40 mg by mouth daily.     loperamide (IMODIUM A-D) 2 MG tablet Take 2-4 mg by mouth See admin instructions. Take 4 mg every 12 hours after 1st loose stool, take 2 mgs every 6 hours as needed for diarrhea max 4 tabs in 24 hours     melatonin 5 MG TABS Take 5 mg by mouth at bedtime.     metoprolol succinate (TOPROL-XL) 50 MG 24 hr tablet Take 50 mg by mouth 2  (two) times daily.     nitroGLYCERIN (NITROSTAT) 0.4 MG SL tablet Place 0.4 mg under the tongue every 5 (five) minutes as needed for chest pain.     NON FORMULARY Take 1 Dose by mouth 2 (two) times daily. Magic Cups     Nutritional Supplement LIQD Take 120 mLs by mouth 2 (two) times daily. House Supplement     ondansetron (ZOFRAN) 4 MG tablet Take 4 mg by mouth every 12 (twelve) hours as needed for vomiting or nausea.     simvastatin (ZOCOR) 40 MG tablet Take 40 mg by mouth at bedtime.     sodium bicarbonate 650 MG tablet Take 650 mg by mouth daily.     thiamine 100 MG tablet Take 100 mg by mouth daily.     atorvastatin (LIPITOR) 80 MG tablet Take 80 mg by mouth at bedtime. (Patient not taking: Reported on 08/06/2022)     nystatin-triamcinolone ointment (MYCOLOG) Apply 1 Application topically 2 (two) times daily. (Patient not taking: Reported on 08/06/2022) 30 g 0   omeprazole (PRILOSEC) 20 MG capsule Take 20 mg by mouth daily. (Patient not taking:  Reported on 08/06/2022)     ZINC SULFATE PO Take 1 tablet by mouth daily. (Patient not taking: Reported on 08/06/2022)     No current facility-administered medications for this visit.     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HCC)    Aphasia following cerebral infarction    Atherosclerotic heart disease of native coronary artery without angina pectoris    Benign prostatic hyperplasia with lower urinary tract symptoms    Benign prostatic hyperplasia without lower urinary tract symptoms    Bradycardia, unspecified    Cerebral infarction (HCC)    Chronic kidney disease, stage 3 unspecified (HCC)    Cognitive communication deficit    Dysarthria    Dysphagia    Encephalopathy    Essential (primary) hypertension    Gastro-esophageal reflux disease without esophagitis    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HCC)    Hemiplegia (HCC)    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 (HCC)    Hyperlipidemia    Hyperosmolality and hypernatremia    Muscle weakness (generalized)    Myocardial infarct, old    Neuromuscular dysfunction of bladder, unspecified    Nontraumatic intracerebral hemorrhage (HCC)    Nontraumatic intracerebral hemorrhage (HCC)    Obstructive sleep apnea    Paroxysmal atrial fibrillation (HCC)    Presence of cardiac pacemaker    Presence of coronary angioplasty implant and graft    Pressure ulcer    Renovascular hypertension    Retention of urine    Seizures (HCC)    TIA (transient ischemic attack)    Type 2 diabetes mellitus without complication (HCC)    Unspecified atrial fibrillation (HCC)    Unspecified disorder of synovium and tendon, right shoulder    Unspecified lack of coordination    Weakness     ROS:   All systems reviewed and negative except as noted in the HPI.   Past Surgical History:  Procedure Laterality Date   FEMUR SURGERY Left    HIP SURGERY Left    IR GASTROSTOMY TUBE REMOVAL  02/26/2022   SPINAL CORD STIMULATOR IMPLANT     per wife nerve stimulator in back     No family history on file.   Social History   Socioeconomic History   Marital status: Married    Spouse name: Not on file   Number of children: Not on file   Years of education: Not on file   Highest education level: Not on file  Occupational History   Not on file  Tobacco Use   Smoking status: Never   Smokeless tobacco: Never  Vaping Use   Vaping status: Never Used  Substance and Sexual Activity   Alcohol use: Not Currently   Drug use: Never   Sexual activity: Not on file  Other Topics Concern   Not on file  Social History Narrative   Not on file   Social Determinants of Health   Financial Resource Strain: Not on file  Food Insecurity: Not on file  Transportation Needs: Not on file  Physical Activity: Not on file  Stress: Not on file  Social Connections: Unknown (03/19/2022)   [facility]   Social  Network    Social Network: Not on file  Intimate Partner Violence: Unknown (03/19/2022)   Received from [facility]   HITS    Physically Hurt: Not on file    Insult or Talk Down To: Not on file    Threaten  Physical Harm: Not on file    Scream or Curse: Not on file     BP 120/80 (BP Location: Left Arm, Patient Position: Sitting, Cuff Size: Normal)   Pulse 60   Wt 157 lb (71.2 kg)   SpO2 95%   BMI 21.90 kg/m   Physical Exam:  Well appearing NAD HEENT: Unremarkable Neck:  No JVD, no thyromegally Lymphatics:  No adenopathy Back:  No CVA tenderness Lungs:  Clear with no wheezes HEART:  Regular rate rhythm, no murmurs, no rubs, no clicks Abd:  soft, positive bowel sounds, no organomegally, no rebound, no guarding Ext:  2 plus pulses, no edema, no cyanosis, no clubbing Skin:  No rashes no nodules Neuro:  CN II through XII intact, motor grossly intact  EKG - nsr with AV pacing  DEVICE  Normal device function.  See PaceArt for details.   Assess/Plan:  PAF - he is asymptomatic and maintaining NSR 99%. He is not a candidate for systemic anti-coag or for the watchman. CAD - he is s/p MI. No chest pain. R HP - he is contracted. Hopefully this can be worked on. 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   Sharlot Gowda Fraidy Mccarrick,MD

## 2022-08-06 NOTE — Patient Instructions (Signed)

## 2022-08-08 ENCOUNTER — Ambulatory Visit (INDEPENDENT_AMBULATORY_CARE_PROVIDER_SITE_OTHER): Payer: Medicare PPO

## 2022-08-08 DIAGNOSIS — I495 Sick sinus syndrome: Secondary | ICD-10-CM | POA: Diagnosis not present

## 2022-08-11 LAB — CUP PACEART REMOTE DEVICE CHECK
Battery Remaining Longevity: 49 mo
Battery Voltage: 2.95 V
Brady Statistic AP VP Percent: 93.98 %
Brady Statistic AP VS Percent: 0.01 %
Brady Statistic AS VP Percent: 5.57 %
Brady Statistic AS VS Percent: 0.45 %
Brady Statistic RA Percent Paced: 94.1 %
Brady Statistic RV Percent Paced: 99.54 %
Date Time Interrogation Session: 20240718164121
Implantable Lead Connection Status: 753985
Implantable Lead Connection Status: 753985
Implantable Lead Implant Date: 20180327
Implantable Lead Implant Date: 20180327
Implantable Lead Location: 753859
Implantable Lead Location: 753860
Implantable Lead Model: 5076
Implantable Lead Model: 5076
Implantable Pulse Generator Implant Date: 20180327
Lead Channel Impedance Value: 285 Ohm
Lead Channel Impedance Value: 323 Ohm
Lead Channel Impedance Value: 380 Ohm
Lead Channel Impedance Value: 380 Ohm
Lead Channel Pacing Threshold Amplitude: 0.5 V
Lead Channel Pacing Threshold Amplitude: 0.625 V
Lead Channel Pacing Threshold Pulse Width: 0.4 ms
Lead Channel Pacing Threshold Pulse Width: 0.4 ms
Lead Channel Sensing Intrinsic Amplitude: 2 mV
Lead Channel Sensing Intrinsic Amplitude: 2 mV
Lead Channel Sensing Intrinsic Amplitude: 23.5 mV
Lead Channel Sensing Intrinsic Amplitude: 23.5 mV
Lead Channel Setting Pacing Amplitude: 1.5 V
Lead Channel Setting Pacing Amplitude: 2 V
Lead Channel Setting Pacing Pulse Width: 0.4 ms
Lead Channel Setting Sensing Sensitivity: 1.2 mV
Zone Setting Status: 755011

## 2022-08-22 NOTE — Progress Notes (Signed)
Remote pacemaker transmission.   

## 2022-09-04 ENCOUNTER — Ambulatory Visit: Payer: Medicare PPO

## 2022-09-04 DIAGNOSIS — R339 Retention of urine, unspecified: Secondary | ICD-10-CM | POA: Diagnosis not present

## 2022-09-04 DIAGNOSIS — N319 Neuromuscular dysfunction of bladder, unspecified: Secondary | ICD-10-CM

## 2022-09-04 NOTE — Progress Notes (Signed)
Suprapubic Cath Change  Patient is present today for a suprapubic catheter change due to urinary retention.  10ml of water was drained from the balloon, a 20FR foley cath was removed from the tract with out difficulty.  Site was cleaned and prepped in a sterile fashion with betadine.  A 20FR foley cath was replaced into the tract no complications were noted. Urine return was noted, 10 ml of sterile water was inflated into the balloon and a bedside bag was attached for drainage.  Patient tolerated well. A night bag was given to patient and proper instruction was given on how to switch bags.    Performed by:  LPN  Follow up: Keep scheduled NV

## 2022-09-07 DIAGNOSIS — G4733 Obstructive sleep apnea (adult) (pediatric): Secondary | ICD-10-CM | POA: Diagnosis not present

## 2022-09-11 ENCOUNTER — Telehealth: Payer: Self-pay | Admitting: Pulmonary Disease

## 2022-09-11 ENCOUNTER — Encounter: Payer: Self-pay | Admitting: Physical Medicine & Rehabilitation

## 2022-09-11 NOTE — Telephone Encounter (Signed)
Patient's wife is calling stating husband is in nursing home. He is pulling off his mask at night from his CPAP machine and she is concerned . The staff doesn't put back on his mask once he takes it off. She checks the recording and it seems that he only wears it for a few hours at night. Please call and advise [phone]

## 2022-09-12 ENCOUNTER — Inpatient Hospital Stay: Payer: Medicare PPO | Attending: Hematology

## 2022-09-12 ENCOUNTER — Ambulatory Visit (HOSPITAL_COMMUNITY)
Admission: RE | Admit: 2022-09-12 | Discharge: 2022-09-12 | Disposition: A | Discharge status: Home / Self Care | Payer: Medicare PPO | Source: Ambulatory Visit | Attending: Hematology | Admitting: Hematology

## 2022-09-12 DIAGNOSIS — R7989 Other specified abnormal findings of blood chemistry: Secondary | ICD-10-CM | POA: Diagnosis not present

## 2022-09-12 DIAGNOSIS — R911 Solitary pulmonary nodule: Secondary | ICD-10-CM | POA: Insufficient documentation

## 2022-09-12 DIAGNOSIS — N3289 Other specified disorders of bladder: Secondary | ICD-10-CM | POA: Diagnosis not present

## 2022-09-12 DIAGNOSIS — N2889 Other specified disorders of kidney and ureter: Secondary | ICD-10-CM | POA: Insufficient documentation

## 2022-09-12 DIAGNOSIS — I700 Atherosclerosis of aorta: Secondary | ICD-10-CM | POA: Diagnosis not present

## 2022-09-12 DIAGNOSIS — N319 Neuromuscular dysfunction of bladder, unspecified: Secondary | ICD-10-CM | POA: Insufficient documentation

## 2022-09-12 DIAGNOSIS — Z79899 Other long term (current) drug therapy: Secondary | ICD-10-CM | POA: Diagnosis not present

## 2022-09-12 DIAGNOSIS — N289 Disorder of kidney and ureter, unspecified: Secondary | ICD-10-CM | POA: Diagnosis not present

## 2022-09-12 DIAGNOSIS — C649 Malignant neoplasm of unspecified kidney, except renal pelvis: Secondary | ICD-10-CM | POA: Diagnosis not present

## 2022-09-12 LAB — CBC WITH DIFFERENTIAL/PLATELET
Abs Immature Granulocytes: 0.05 10*3/uL (ref 0.00–0.07)
Basophils Absolute: 0 10*3/uL (ref 0.0–0.1)
Basophils Relative: 1 %
Eosinophils Absolute: 0.1 10*3/uL (ref 0.0–0.5)
Eosinophils Relative: 2 %
HCT: 41.8 % (ref 39.0–52.0)
Hemoglobin: 13.5 g/dL (ref 13.0–17.0)
Immature Granulocytes: 1 %
Lymphocytes Relative: 24 %
Lymphs Abs: 1.4 10*3/uL (ref 0.7–4.0)
MCH: 29.2 pg (ref 26.0–34.0)
MCHC: 32.3 g/dL (ref 30.0–36.0)
MCV: 90.3 fL (ref 80.0–100.0)
Monocytes Absolute: 1.1 10*3/uL — ABNORMAL HIGH (ref 0.1–1.0)
Monocytes Relative: 19 %
Neutro Abs: 3.1 10*3/uL (ref 1.7–7.7)
Neutrophils Relative %: 53 %
Platelets: 205 10*3/uL (ref 150–400)
RBC: 4.63 MIL/uL (ref 4.22–5.81)
RDW: 14 % (ref 11.5–15.5)
WBC: 5.7 10*3/uL (ref 4.0–10.5)
nRBC: 0 % (ref 0.0–0.2)

## 2022-09-12 LAB — COMPREHENSIVE METABOLIC PANEL
ALT: 18 U/L (ref 0–44)
AST: 18 U/L (ref 15–41)
Albumin: 3.3 g/dL — ABNORMAL LOW (ref 3.5–5.0)
Alkaline Phosphatase: 111 U/L (ref 38–126)
Anion gap: 7 (ref 5–15)
BUN: 39 mg/dL — ABNORMAL HIGH (ref 8–23)
CO2: 23 mmol/L (ref 22–32)
Calcium: 8.9 mg/dL (ref 8.9–10.3)
Chloride: 107 mmol/L (ref 98–111)
Creatinine, Ser: 1.57 mg/dL — ABNORMAL HIGH (ref 0.61–1.24)
GFR, Estimated: 45 mL/min — ABNORMAL LOW (ref >60)
Glucose, Bld: 116 mg/dL — ABNORMAL HIGH (ref 70–99)
Potassium: 3.9 mmol/L (ref 3.5–5.1)
Sodium: 137 mmol/L (ref 135–145)
Total Bilirubin: 0.4 mg/dL (ref 0.3–1.2)
Total Protein: 7.2 g/dL (ref 6.5–8.1)

## 2022-09-12 MED ORDER — IOHEXOL 300 MG/ML  SOLN
100.0000 mL | Freq: Once | INTRAMUSCULAR | Status: AC | PRN
  Administered 2022-09-12: 80 mL via INTRAVENOUS

## 2022-09-16 DIAGNOSIS — I10 Essential (primary) hypertension: Secondary | ICD-10-CM | POA: Diagnosis not present

## 2022-09-16 DIAGNOSIS — M62838 Other muscle spasm: Secondary | ICD-10-CM | POA: Diagnosis not present

## 2022-09-16 DIAGNOSIS — K5900 Constipation, unspecified: Secondary | ICD-10-CM | POA: Diagnosis not present

## 2022-09-16 NOTE — Telephone Encounter (Signed)
Patients wife returning call. Wife wanted to know what she can do, states the mask fits him well, maybe upping his melatonin. Wife is very concerned. Please call back.

## 2022-09-16 NOTE — Telephone Encounter (Signed)
Attempted to contact patient's wife. No answer. Voicemail left requesting return call.

## 2022-09-17 ENCOUNTER — Emergency Department (HOSPITAL_COMMUNITY)
Admission: EM | Admit: 2022-09-17 | Discharge: 2022-09-17 | Disposition: A | Discharge status: Home / Self Care | Payer: Medicare PPO | Attending: Emergency Medicine | Admitting: Emergency Medicine

## 2022-09-17 ENCOUNTER — Encounter (HOSPITAL_COMMUNITY): Payer: Self-pay | Admitting: Emergency Medicine

## 2022-09-17 ENCOUNTER — Telehealth: Payer: Self-pay | Admitting: Urology

## 2022-09-17 ENCOUNTER — Other Ambulatory Visit: Payer: Self-pay

## 2022-09-17 DIAGNOSIS — T83098A Other mechanical complication of other indwelling urethral catheter, initial encounter: Secondary | ICD-10-CM | POA: Diagnosis not present

## 2022-09-17 DIAGNOSIS — T83091A Other mechanical complication of indwelling urethral catheter, initial encounter: Secondary | ICD-10-CM | POA: Insufficient documentation

## 2022-09-17 DIAGNOSIS — R5381 Other malaise: Secondary | ICD-10-CM | POA: Diagnosis not present

## 2022-09-17 DIAGNOSIS — T83198A Other mechanical complication of other urinary devices and implants, initial encounter: Secondary | ICD-10-CM | POA: Diagnosis not present

## 2022-09-17 DIAGNOSIS — Z7401 Bed confinement status: Secondary | ICD-10-CM | POA: Diagnosis not present

## 2022-09-17 DIAGNOSIS — R4701 Aphasia: Secondary | ICD-10-CM | POA: Diagnosis not present

## 2022-09-17 DIAGNOSIS — R531 Weakness: Secondary | ICD-10-CM | POA: Diagnosis not present

## 2022-09-17 DIAGNOSIS — T83010A Breakdown (mechanical) of cystostomy catheter, initial encounter: Secondary | ICD-10-CM

## 2022-09-17 NOTE — ED Triage Notes (Signed)
Per EMS pt coming from [facility]- staff states patient pulled out his suprapubic catheter. Per staff pt at baseline.

## 2022-09-17 NOTE — ED Notes (Signed)
Suprapubic catheter replaced by Jeraldine Loots MD at this time. Patients wife at bedside and given update.

## 2022-09-17 NOTE — ED Notes (Signed)
Attempted to call [facility] with no answer x 2. Wife at bedside and has been given dispo instructions

## 2022-09-17 NOTE — Telephone Encounter (Signed)
Patient wife called Dylan Martin has had a odor and discharge coming from catheter area, she would like your advice on this.

## 2022-09-17 NOTE — ED Notes (Signed)
Attempted to call [facility] facility with no answer x 2.

## 2022-09-17 NOTE — Telephone Encounter (Signed)
Offered to see about changing masks to another that would be less likely to come off during the night. Wife is not interested in changing.   Please advise, thank you!

## 2022-09-17 NOTE — Telephone Encounter (Signed)
Please see below.  Patient scheduled 09/11 for cath change.  Any other recommendation other than increasing fluids at this time?

## 2022-09-17 NOTE — ED Provider Notes (Signed)
[facility] Provider Note   CSN: [id number] Arrival date & time: 09/17/22  1747     History  Chief Complaint  Patient presents with   Foley pulled out    Dylan Martin is a 77 y.o. male.  HPI Patient presents via EMS.  He is initially alone, but is joined by his wife eventually.  Patient has chronic indwelling suprapubic catheter due to history of stroke, due to stroke patient is also aphasic, both 5 caveat. Primus the patient dislodged his suprapubic catheter earlier today.  Last replacement was 2 weeks ago.  No other injuries or complaints noted.    Home Medications Prior to Admission medications   Medication Sig Start Date End Date Taking? Authorizing Provider  acetaminophen (TYLENOL) 325 MG tablet Take 650 mg by mouth every 4 (four) hours as needed for mild pain or fever.    [provider]  amLODipine (NORVASC) 10 MG tablet Take 10 mg by mouth daily. 04/12/21   [provider]  Ascorbic Acid (VITAMIN C) 500 MG CAPS Take 1,000 mg by mouth daily.    [provider]  atorvastatin (LIPITOR) 80 MG tablet Take 80 mg by mouth at bedtime. Patient not taking: Reported on 08/06/2022 04/12/21   [provider]  bisacodyl (DULCOLAX) 10 MG suppository Place 10 mg rectally daily as needed for moderate constipation. 11/17/20   [provider]  cyclobenzaprine (FLEXERIL) 5 MG tablet Take 5 mg by mouth every 8 (eight) hours as needed for muscle spasms. 04/19/21   [provider]  famotidine (PEPCID) 20 MG tablet Take 20 mg by mouth daily. 08/01/22   [provider]  fexofenadine (ALLEGRA) 180 MG tablet Take 180 mg by mouth daily as needed for allergies or rhinitis.    [provider]  FLUoxetine (PROZAC) 20 MG capsule Take 20 mg by mouth daily. 05/04/21   [provider]  hydrALAZINE (APRESOLINE) 25 MG tablet Take 25 mg by mouth every 8 (eight) hours. 04/12/21   [provider]  lisinopril (ZESTRIL) 40 MG tablet Take 40 mg by mouth daily. 04/12/21   [provider]  loperamide (IMODIUM A-D) 2 MG tablet Take 2-4 mg by mouth See admin instructions. Take 4 mg every 12 hours after 1st loose stool, take 2 mgs every 6 hours as needed for diarrhea max 4 tabs in 24 hours    [provider]  melatonin 5 MG TABS Take 5 mg by mouth at bedtime.    [provider]  metoprolol succinate (TOPROL-XL) 50 MG 24 hr tablet Take 50 mg by mouth 2 (two) times daily. 06/15/21   [provider]  nitroGLYCERIN (NITROSTAT) 0.4 MG SL tablet Place 0.4 mg under the tongue every 5 (five) minutes as needed for chest pain.    [provider]  NON FORMULARY Take 1 Dose by mouth 2 (two) times daily. Magic Cups    [provider]  Nutritional Supplement LIQD Take 120 mLs by mouth 2 (two) times daily. House Supplement    [provider]  nystatin-triamcinolone ointment (MYCOLOG) Apply 1 Application topically 2 (two) times daily. Patient not taking: Reported on 08/06/2022 01/28/22   Malen Gauze, MD  omeprazole (PRILOSEC) 20 MG capsule Take 20 mg by mouth daily. Patient not taking: Reported on 08/06/2022    [provider]  ondansetron (ZOFRAN) 4 MG tablet Take 4 mg by mouth every 12 (twelve) hours as needed for vomiting or nausea. 06/28/21  [provider]  simvastatin (ZOCOR) 40 MG tablet Take 40 mg by mouth at bedtime. 06/29/22   [provider]  sodium bicarbonate 650 MG tablet Take 650 mg by mouth daily. 11/17/20   [provider]  thiamine 100 MG tablet Take 100 mg by mouth daily. 11/17/20   [provider]  ZINC SULFATE PO Take 1 tablet by mouth daily. Patient not taking: Reported on 08/06/2022    [provider]      Allergies    Aspirin, Other, and Shrimp (diagnostic)    Review of Systems   Review of Systems  Unable to perform ROS: Patient nonverbal    Physical  Exam Updated Vital Signs BP (!) 135/93   Pulse 70   Temp 98.4 F (36.9 C) (Oral)   Resp 18   Ht 5\' 11"  (1.803 m)   Wt 71.2 kg   SpO2 96%   BMI 21.90 kg/m  Physical Exam Vitals and nursing note reviewed.  Constitutional:      General: He is not in acute distress.    Appearance: He is well-developed. He is ill-appearing.     Comments: Chronically ill-appearing elderly male with atrophy, contraction, nonverbal  HENT:     Head: Normocephalic and atraumatic.  Eyes:     Conjunctiva/sclera: Conjunctivae normal.  Cardiovascular:     Rate and Rhythm: Normal rate and regular rhythm.  Pulmonary:     Effort: Pulmonary effort is normal. No respiratory distress.     Breath sounds: No stridor.  Abdominal:     General: There is no distension.     Comments: Lower abdomen with stoma from suprapubic catheter no bleeding, purulence  Skin:    General: Skin is warm and dry.  Neurological:     Mental Status: He is alert and oriented to person, place, and time.     ED Results / Procedures / Treatments   Labs (all labs ordered are listed, but only abnormal results are displayed) Labs Reviewed - No data to display  EKG None  Radiology No results found.  Procedures SUPRAPUBIC TUBE PLACEMENT  Date/Time: 09/17/2022 6:38 PM  Performed by: Gerhard Munch, MD Authorized by: Gerhard Munch, MD   Consent:    Consent obtained:  Emergent situation   Consent given by:  Healthcare agent (spouse confirms that it was ok)   Alternatives discussed:  Alternative treatment Universal protocol:    Immediately prior to procedure, a time out was called: yes     Patient identity confirmed:  Arm band Sedation:    Sedation type:  None Anesthesia:    Anesthesia method:  None Procedure details:    Complexity:  Simple   Catheter type:  Foley   Catheter size:  20 Fr   Ultrasound guidance: no     Number of attempts:  1   Urine characteristics:  Clear Post-procedure details:    Procedure  completion:  Tolerated well, no immediate complications     Medications Ordered in ED Medications - No data to display  ED Course/ Medical Decision Making/ A&P                                 Medical Decision Making Adult male with dependency on suprapubic catheter presents with dislodged catheter.  Patient is awake and alert, seemingly not changed from baseline.  He is eventually companied by his wife, notes no changes from baseline either.  Patient had replacement of  his suprapubic catheter, and absent hypotension, fever, tachycardia, no evidence for concurrent infection.  Patient will follow-up with urology.  Amount and/or Complexity of Data Reviewed Independent Historian: spouse and EMS External Data Reviewed: notes.  Risk Diagnosis or treatment significantly limited by social determinants of health.  Final Clinical Impression(s) / ED Diagnoses Final diagnoses:  Suprapubic catheter dysfunction, initial encounter [facility])    Rx / DC Orders ED Discharge Orders     None         Gerhard Munch, MD 09/17/22 1839

## 2022-09-18 NOTE — Telephone Encounter (Signed)
[facility]   Spoke with patient's wife. Reiterated possible change of mask would be best way to start. She agrees. CM sent to [employer] team for any advice/assistance with possible alternative to his current mask. She is also hoping to move him home or to another facility in the near future as she does not think he is receiving quality care currently. Advised her we will be in touch once we have more information from [employer].

## 2022-09-18 NOTE — Telephone Encounter (Signed)
Patient wife call Dylan Martin was in ER yesterday the nursing home said that he pulled out his cath and had to be transported to ER to have it placed again.   She is going to keep the appointment on 9/11 to have cath replaced.

## 2022-09-19 ENCOUNTER — Inpatient Hospital Stay: Payer: Medicare PPO | Admitting: Hematology

## 2022-09-19 ENCOUNTER — Ambulatory Visit: Payer: Medicare PPO | Admitting: Hematology

## 2022-09-19 ENCOUNTER — Inpatient Hospital Stay (HOSPITAL_BASED_OUTPATIENT_CLINIC_OR_DEPARTMENT_OTHER): Payer: Medicare PPO | Admitting: Hematology

## 2022-09-19 DIAGNOSIS — M62838 Other muscle spasm: Secondary | ICD-10-CM | POA: Diagnosis not present

## 2022-09-19 DIAGNOSIS — K5900 Constipation, unspecified: Secondary | ICD-10-CM | POA: Diagnosis not present

## 2022-09-19 DIAGNOSIS — N1832 Chronic kidney disease, stage 3b: Secondary | ICD-10-CM | POA: Diagnosis not present

## 2022-09-19 DIAGNOSIS — I10 Essential (primary) hypertension: Secondary | ICD-10-CM | POA: Diagnosis not present

## 2022-09-19 DIAGNOSIS — F329 Major depressive disorder, single episode, unspecified: Secondary | ICD-10-CM | POA: Diagnosis not present

## 2022-09-19 DIAGNOSIS — N2889 Other specified disorders of kidney and ureter: Secondary | ICD-10-CM

## 2022-09-19 NOTE — Progress Notes (Signed)
Virtual Visit via Telephone Note  I connected with [name] on 09/19/22  at  2:00 PM EDT by telephone and verified that I am speaking with the correct person using two identifiers.  Location: Patient: At home Provider: At office   I discussed the limitations, risks, security and privacy concerns of performing an evaluation and management service by telephone and the availability of in person appointments. I also discussed with the patient that there may be a patient responsible charge related to this service. The patient expressed understanding and agreed to proceed.   History of Present Illness: Dylan Martin is a 77 y.o. male on a tele-med visit for follow-up of bilateral kidney masses. Since his last visit, he presented to the ED on 09/17/22 for dislodging his suprapubic catheter and had it placed. I am calling for his recent CT A/P results on 09/12/22 that found: interval increase in size of the bilateral solid renal neoplasms, measuring up to 5.1 cm on the right and 3.0 cm on the left, consistent with renal cell carcinoma; stable 9 mm bilateral small renal neoplasms; stable post ablation changes in the left lower pole kidney; stable 4 mm right lower lobe pulmonary nodule; and moderate volume of formed stool throughout the colon with a large stool ball in the rectal vault and mild rectal wall thickening with perirectal fluid, findings suggestive of stercoral colitis.   Observations/Objective: I have talked to the wife of the patient as he is unable to talk due to stroke.  No significant changes in his functional status.   Assessment and Plan:  1.  Bilateral kidney masses: - I reviewed labs from 09/12/2022: Normal LFTs.  Creatinine elevated at 1.57 and stable.  CBC grossly normal. - CTAP (09/12/2022): Right kidney mass measures 5.1 x 4.9 cm, previously 4.3 x 4.3 cm.  Left interpolar kidney measures 3 x 2.8 cm previously 2.2 x 2.2 cm.  Stable small enhancing lesion along the anterior margin of  the right kidney measuring approximately 9 mm. - Wife wishes to continue to follow-up with repeat scans.  I have recommended follow-up in 1 year with repeat CTAP.   Follow Up Instructions: RTC 1 year   I discussed the assessment and treatment plan with the patient. The patient was provided an opportunity to ask questions and all were answered. The patient agreed with the plan and demonstrated an understanding of the instructions.   The patient was advised to call back or seek an in-person evaluation if the symptoms worsen or if the condition fails to improve as anticipated.  I provided 22 minutes of non-face-to-face time during this encounter.   Alben Deeds Teague,acting as a Neurosurgeon for Doreatha Massed, MD.,have documented all relevant documentation on the behalf of Doreatha Massed, MD,as directed by  Doreatha Massed, MD while in the presence of Doreatha Massed, MD.  I, Doreatha Massed MD, have reviewed the above documentation for accuracy and completeness, and I agree with the above.   Doreatha Massed, MD

## 2022-09-20 DIAGNOSIS — R451 Restlessness and agitation: Secondary | ICD-10-CM | POA: Diagnosis not present

## 2022-09-27 DIAGNOSIS — R451 Restlessness and agitation: Secondary | ICD-10-CM | POA: Diagnosis not present

## 2022-10-01 ENCOUNTER — Telehealth: Payer: Self-pay

## 2022-10-01 NOTE — Telephone Encounter (Signed)
Transition Care Management Unsuccessful Follow-up Telephone Call  Date of discharge and from where:  09/17/2022 [facility]  Attempts:  1st Attempt  Reason for unsuccessful TCM follow-up call:  Left voice message  Matraca Hunkins [facility]  [facility], [facility] Resource Care Guide Direct Dial: [phone]  Website: [email or web address]

## 2022-10-02 ENCOUNTER — Telehealth: Payer: Self-pay

## 2022-10-02 ENCOUNTER — Ambulatory Visit: Payer: Medicare PPO

## 2022-10-02 NOTE — Telephone Encounter (Signed)
Transition Care Management Unsuccessful Follow-up Telephone Call  Date of discharge and from where:  09/17/2022 [facility]  Attempts:  2nd Attempt  Reason for unsuccessful TCM follow-up call:  No answer/busy  Nhyla Nappi [facility]  [facility], [facility] Resource Care Guide Direct Dial: [phone]  Website: [email or web address]

## 2022-10-02 NOTE — Telephone Encounter (Signed)
Patient wife is upset that we had to reschedule appointment.   She is upset with the nursing home that they went to [facility] and made him miss his appointment  She said they are not being accountable for his care,  they said he pulled out his cath and had to be transported to the ED, she said he has never pulled his cath out before why would he start now.   She wanted you to know all this information to keep track of it.

## 2022-10-08 ENCOUNTER — Ambulatory Visit: Payer: Medicare PPO

## 2022-10-08 DIAGNOSIS — G4733 Obstructive sleep apnea (adult) (pediatric): Secondary | ICD-10-CM | POA: Diagnosis not present

## 2022-10-09 ENCOUNTER — Ambulatory Visit: Payer: Medicare PPO

## 2022-10-14 ENCOUNTER — Ambulatory Visit (INDEPENDENT_AMBULATORY_CARE_PROVIDER_SITE_OTHER): Payer: Medicare PPO

## 2022-10-14 DIAGNOSIS — N319 Neuromuscular dysfunction of bladder, unspecified: Secondary | ICD-10-CM

## 2022-10-14 DIAGNOSIS — I2510 Atherosclerotic heart disease of native coronary artery without angina pectoris: Secondary | ICD-10-CM | POA: Diagnosis not present

## 2022-10-14 DIAGNOSIS — K219 Gastro-esophageal reflux disease without esophagitis: Secondary | ICD-10-CM | POA: Diagnosis not present

## 2022-10-14 DIAGNOSIS — J302 Other seasonal allergic rhinitis: Secondary | ICD-10-CM | POA: Diagnosis not present

## 2022-10-14 NOTE — Progress Notes (Signed)
Suprapubic Cath Change  Patient is present today for a suprapubic catheter change due to urinary retention.  20ml of water was drained from the balloon, a 20FR foley cath was removed from the tract with out difficulty.  Site was cleaned and prepped in a sterile fashion with betadine.  A 20FR foley cath was replaced into the tract no complications were noted. Urine return was noted, 10 ml of sterile water was inflated into the balloon and a night bag was attached for drainage.  Patient tolerated well. A night bag was given to patient and proper instruction was given on how to switch bags.    Performed by: Kennyth Lose, CMA  Follow up: No follow-ups on file.

## 2022-10-22 ENCOUNTER — Encounter: Payer: Medicare PPO | Admitting: Physical Medicine & Rehabilitation

## 2022-10-28 DIAGNOSIS — M62838 Other muscle spasm: Secondary | ICD-10-CM | POA: Diagnosis not present

## 2022-10-30 DIAGNOSIS — M79674 Pain in right toe(s): Secondary | ICD-10-CM | POA: Diagnosis not present

## 2022-10-30 DIAGNOSIS — M79675 Pain in left toe(s): Secondary | ICD-10-CM | POA: Diagnosis not present

## 2022-10-30 DIAGNOSIS — B351 Tinea unguium: Secondary | ICD-10-CM | POA: Diagnosis not present

## 2022-11-04 DIAGNOSIS — K5909 Other constipation: Secondary | ICD-10-CM | POA: Diagnosis not present

## 2022-11-04 DIAGNOSIS — M6240 Contracture of muscle, unspecified site: Secondary | ICD-10-CM | POA: Diagnosis not present

## 2022-11-04 DIAGNOSIS — I10 Essential (primary) hypertension: Secondary | ICD-10-CM | POA: Diagnosis not present

## 2022-11-06 ENCOUNTER — Ambulatory Visit: Payer: Medicare PPO

## 2022-11-06 DIAGNOSIS — R339 Retention of urine, unspecified: Secondary | ICD-10-CM

## 2022-11-06 DIAGNOSIS — N319 Neuromuscular dysfunction of bladder, unspecified: Secondary | ICD-10-CM

## 2022-11-06 NOTE — Progress Notes (Signed)
Suprapubic Cath Change  Patient is present today for a suprapubic catheter change due to urinary retention.  10ml of water was drained from the balloon, a 20FR foley cath was removed from the tract with out difficulty.  Site was cleaned and prepped in a sterile fashion with betadine.  A 20FR foley cath was replaced into the tract no complications were noted. Urine return was noted, 10 ml of sterile water was inflated into the balloon and a bed bag was attached for drainage.  Patient tolerated well. A night bag was given to patient and proper instruction was given on how to switch bags.    Performed by: Guss Bunde, CMA  Follow up: as scheduled.

## 2022-11-07 ENCOUNTER — Ambulatory Visit (INDEPENDENT_AMBULATORY_CARE_PROVIDER_SITE_OTHER): Payer: Medicare PPO

## 2022-11-07 DIAGNOSIS — I495 Sick sinus syndrome: Secondary | ICD-10-CM | POA: Diagnosis not present

## 2022-11-07 DIAGNOSIS — G4733 Obstructive sleep apnea (adult) (pediatric): Secondary | ICD-10-CM | POA: Diagnosis not present

## 2022-11-07 LAB — CUP PACEART REMOTE DEVICE CHECK
Battery Remaining Longevity: 47 mo
Battery Voltage: 2.95 V
Brady Statistic AP VP Percent: 91.67 %
Brady Statistic AP VS Percent: 0.01 %
Brady Statistic AS VP Percent: 7.67 %
Brady Statistic AS VS Percent: 0.65 %
Brady Statistic RA Percent Paced: 91.81 %
Brady Statistic RV Percent Paced: 99.34 %
Date Time Interrogation Session: 20241017064851
Implantable Lead Connection Status: 753985
Implantable Lead Connection Status: 753985
Implantable Lead Implant Date: 20180327
Implantable Lead Implant Date: 20180327
Implantable Lead Location: 753859
Implantable Lead Location: 753860
Implantable Lead Model: 5076
Implantable Lead Model: 5076
Implantable Pulse Generator Implant Date: 20180327
Lead Channel Impedance Value: 304 Ohm
Lead Channel Impedance Value: 342 Ohm
Lead Channel Impedance Value: 380 Ohm
Lead Channel Impedance Value: 399 Ohm
Lead Channel Pacing Threshold Amplitude: 0.5 V
Lead Channel Pacing Threshold Amplitude: 0.625 V
Lead Channel Pacing Threshold Pulse Width: 0.4 ms
Lead Channel Pacing Threshold Pulse Width: 0.4 ms
Lead Channel Sensing Intrinsic Amplitude: 2.25 mV
Lead Channel Sensing Intrinsic Amplitude: 2.25 mV
Lead Channel Sensing Intrinsic Amplitude: 23.5 mV
Lead Channel Sensing Intrinsic Amplitude: 23.5 mV
Lead Channel Setting Pacing Amplitude: 1.5 V
Lead Channel Setting Pacing Amplitude: 2 V
Lead Channel Setting Pacing Pulse Width: 0.4 ms
Lead Channel Setting Sensing Sensitivity: 1.2 mV
Zone Setting Status: 755011

## 2022-11-20 DIAGNOSIS — J302 Other seasonal allergic rhinitis: Secondary | ICD-10-CM | POA: Diagnosis not present

## 2022-11-20 DIAGNOSIS — K219 Gastro-esophageal reflux disease without esophagitis: Secondary | ICD-10-CM | POA: Diagnosis not present

## 2022-11-20 DIAGNOSIS — I2510 Atherosclerotic heart disease of native coronary artery without angina pectoris: Secondary | ICD-10-CM | POA: Diagnosis not present

## 2022-11-27 NOTE — Progress Notes (Signed)
Remote pacemaker transmission.   

## 2022-11-28 DIAGNOSIS — M62461 Contracture of muscle, right lower leg: Secondary | ICD-10-CM | POA: Diagnosis not present

## 2022-11-28 DIAGNOSIS — M62462 Contracture of muscle, left lower leg: Secondary | ICD-10-CM | POA: Diagnosis not present

## 2022-11-28 DIAGNOSIS — M25552 Pain in left hip: Secondary | ICD-10-CM | POA: Diagnosis not present

## 2022-11-28 DIAGNOSIS — F4323 Adjustment disorder with mixed anxiety and depressed mood: Secondary | ICD-10-CM | POA: Diagnosis not present

## 2022-11-30 DIAGNOSIS — M25552 Pain in left hip: Secondary | ICD-10-CM | POA: Diagnosis not present

## 2022-11-30 DIAGNOSIS — M25551 Pain in right hip: Secondary | ICD-10-CM | POA: Diagnosis not present

## 2022-11-30 DIAGNOSIS — R102 Pelvic and perineal pain: Secondary | ICD-10-CM | POA: Diagnosis not present

## 2022-12-01 ENCOUNTER — Emergency Department (HOSPITAL_COMMUNITY): Payer: Medicare PPO

## 2022-12-01 ENCOUNTER — Encounter (HOSPITAL_COMMUNITY): Payer: Self-pay

## 2022-12-01 ENCOUNTER — Inpatient Hospital Stay (HOSPITAL_COMMUNITY)
Admission: EM | Admit: 2022-12-01 | Discharge: 2022-12-06 | DRG: 698 | Disposition: A | Discharge status: Skilled Nursing Facility | Payer: Medicare PPO | Source: Skilled Nursing Facility | Attending: Internal Medicine | Admitting: Internal Medicine

## 2022-12-01 ENCOUNTER — Other Ambulatory Visit: Payer: Self-pay

## 2022-12-01 DIAGNOSIS — Z8679 Personal history of other diseases of the circulatory system: Secondary | ICD-10-CM

## 2022-12-01 DIAGNOSIS — Z515 Encounter for palliative care: Secondary | ICD-10-CM | POA: Diagnosis not present

## 2022-12-01 DIAGNOSIS — I2510 Atherosclerotic heart disease of native coronary artery without angina pectoris: Secondary | ICD-10-CM | POA: Diagnosis present

## 2022-12-01 DIAGNOSIS — Z888 Allergy status to other drugs, medicaments and biological substances status: Secondary | ICD-10-CM

## 2022-12-01 DIAGNOSIS — N183 Chronic kidney disease, stage 3 unspecified: Secondary | ICD-10-CM | POA: Diagnosis present

## 2022-12-01 DIAGNOSIS — A419 Sepsis, unspecified organism: Secondary | ICD-10-CM | POA: Diagnosis not present

## 2022-12-01 DIAGNOSIS — Z9682 Presence of neurostimulator: Secondary | ICD-10-CM

## 2022-12-01 DIAGNOSIS — J690 Pneumonitis due to inhalation of food and vomit: Secondary | ICD-10-CM | POA: Diagnosis not present

## 2022-12-01 DIAGNOSIS — Z79899 Other long term (current) drug therapy: Secondary | ICD-10-CM

## 2022-12-01 DIAGNOSIS — Z7189 Other specified counseling: Secondary | ICD-10-CM | POA: Diagnosis not present

## 2022-12-01 DIAGNOSIS — I480 Paroxysmal atrial fibrillation: Secondary | ICD-10-CM | POA: Diagnosis present

## 2022-12-01 DIAGNOSIS — R41841 Cognitive communication deficit: Secondary | ICD-10-CM

## 2022-12-01 DIAGNOSIS — M24561 Contracture, right knee: Secondary | ICD-10-CM | POA: Diagnosis present

## 2022-12-01 DIAGNOSIS — I69320 Aphasia following cerebral infarction: Secondary | ICD-10-CM

## 2022-12-01 DIAGNOSIS — G4733 Obstructive sleep apnea (adult) (pediatric): Secondary | ICD-10-CM | POA: Diagnosis not present

## 2022-12-01 DIAGNOSIS — J9601 Acute respiratory failure with hypoxia: Secondary | ICD-10-CM | POA: Diagnosis not present

## 2022-12-01 DIAGNOSIS — T83518A Infection and inflammatory reaction due to other urinary catheter, initial encounter: Secondary | ICD-10-CM | POA: Diagnosis not present

## 2022-12-01 DIAGNOSIS — R0902 Hypoxemia: Secondary | ICD-10-CM | POA: Diagnosis not present

## 2022-12-01 DIAGNOSIS — I69151 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 affecting right dominant side: Secondary | ICD-10-CM

## 2022-12-01 DIAGNOSIS — N390 Urinary tract infection, site not specified: Secondary | ICD-10-CM | POA: Diagnosis present

## 2022-12-01 DIAGNOSIS — I150 Renovascular hypertension: Secondary | ICD-10-CM | POA: Diagnosis present

## 2022-12-01 DIAGNOSIS — Z435 Encounter for attention to cystostomy: Secondary | ICD-10-CM

## 2022-12-01 DIAGNOSIS — Y846 Urinary catheterization as the cause of abnormal reaction of the patient, or of later complication, without mention of misadventure at the time of the procedure: Secondary | ICD-10-CM | POA: Diagnosis present

## 2022-12-01 DIAGNOSIS — N179 Acute kidney failure, unspecified: Secondary | ICD-10-CM | POA: Diagnosis present

## 2022-12-01 DIAGNOSIS — R293 Abnormal posture: Secondary | ICD-10-CM | POA: Diagnosis present

## 2022-12-01 DIAGNOSIS — Z955 Presence of coronary angioplasty implant and graft: Secondary | ICD-10-CM

## 2022-12-01 DIAGNOSIS — N401 Enlarged prostate with lower urinary tract symptoms: Secondary | ICD-10-CM | POA: Diagnosis present

## 2022-12-01 DIAGNOSIS — N2889 Other specified disorders of kidney and ureter: Secondary | ICD-10-CM | POA: Diagnosis not present

## 2022-12-01 DIAGNOSIS — I4891 Unspecified atrial fibrillation: Secondary | ICD-10-CM | POA: Diagnosis not present

## 2022-12-01 DIAGNOSIS — M109 Gout, unspecified: Secondary | ICD-10-CM | POA: Diagnosis present

## 2022-12-01 DIAGNOSIS — Z950 Presence of cardiac pacemaker: Secondary | ICD-10-CM | POA: Diagnosis not present

## 2022-12-01 DIAGNOSIS — Z931 Gastrostomy status: Secondary | ICD-10-CM

## 2022-12-01 DIAGNOSIS — I10 Essential (primary) hypertension: Secondary | ICD-10-CM | POA: Diagnosis not present

## 2022-12-01 DIAGNOSIS — K219 Gastro-esophageal reflux disease without esophagitis: Secondary | ICD-10-CM | POA: Diagnosis present

## 2022-12-01 DIAGNOSIS — I69391 Dysphagia following cerebral infarction: Secondary | ICD-10-CM

## 2022-12-01 DIAGNOSIS — E119 Type 2 diabetes mellitus without complications: Secondary | ICD-10-CM

## 2022-12-01 DIAGNOSIS — M6281 Muscle weakness (generalized): Secondary | ICD-10-CM | POA: Diagnosis present

## 2022-12-01 DIAGNOSIS — R000 Tachycardia, unspecified: Secondary | ICD-10-CM | POA: Diagnosis not present

## 2022-12-01 DIAGNOSIS — I69120 Aphasia following nontraumatic intracerebral hemorrhage: Secondary | ICD-10-CM | POA: Diagnosis not present

## 2022-12-01 DIAGNOSIS — I517 Cardiomegaly: Secondary | ICD-10-CM | POA: Diagnosis not present

## 2022-12-01 DIAGNOSIS — E785 Hyperlipidemia, unspecified: Secondary | ICD-10-CM | POA: Diagnosis present

## 2022-12-01 DIAGNOSIS — N320 Bladder-neck obstruction: Secondary | ICD-10-CM | POA: Diagnosis present

## 2022-12-01 DIAGNOSIS — T83010A Breakdown (mechanical) of cystostomy catheter, initial encounter: Secondary | ICD-10-CM

## 2022-12-01 DIAGNOSIS — E1122 Type 2 diabetes mellitus with diabetic chronic kidney disease: Secondary | ICD-10-CM | POA: Diagnosis present

## 2022-12-01 DIAGNOSIS — Z7401 Bed confinement status: Secondary | ICD-10-CM | POA: Diagnosis not present

## 2022-12-01 DIAGNOSIS — R6521 Severe sepsis with septic shock: Principal | ICD-10-CM | POA: Diagnosis present

## 2022-12-01 DIAGNOSIS — N319 Neuromuscular dysfunction of bladder, unspecified: Secondary | ICD-10-CM | POA: Diagnosis not present

## 2022-12-01 DIAGNOSIS — I252 Old myocardial infarction: Secondary | ICD-10-CM

## 2022-12-01 DIAGNOSIS — L8990 Pressure ulcer of unspecified site, unspecified stage: Secondary | ICD-10-CM | POA: Insufficient documentation

## 2022-12-01 DIAGNOSIS — E872 Acidosis, unspecified: Secondary | ICD-10-CM | POA: Diagnosis not present

## 2022-12-01 DIAGNOSIS — N178 Other acute kidney failure: Secondary | ICD-10-CM | POA: Diagnosis not present

## 2022-12-01 DIAGNOSIS — E860 Dehydration: Secondary | ICD-10-CM | POA: Diagnosis present

## 2022-12-01 DIAGNOSIS — R062 Wheezing: Secondary | ICD-10-CM | POA: Diagnosis not present

## 2022-12-01 DIAGNOSIS — I6782 Cerebral ischemia: Secondary | ICD-10-CM | POA: Diagnosis not present

## 2022-12-01 DIAGNOSIS — Z886 Allergy status to analgesic agent status: Secondary | ICD-10-CM

## 2022-12-01 DIAGNOSIS — R509 Fever, unspecified: Secondary | ICD-10-CM | POA: Diagnosis present

## 2022-12-01 DIAGNOSIS — Z1152 Encounter for screening for COVID-19: Secondary | ICD-10-CM | POA: Diagnosis not present

## 2022-12-01 DIAGNOSIS — E870 Hyperosmolality and hypernatremia: Secondary | ICD-10-CM | POA: Diagnosis present

## 2022-12-01 DIAGNOSIS — Z86711 Personal history of pulmonary embolism: Secondary | ICD-10-CM

## 2022-12-01 DIAGNOSIS — R4182 Altered mental status, unspecified: Secondary | ICD-10-CM | POA: Diagnosis not present

## 2022-12-01 DIAGNOSIS — G8190 Hemiplegia, unspecified affecting unspecified side: Secondary | ICD-10-CM

## 2022-12-01 DIAGNOSIS — G8191 Hemiplegia, unspecified affecting right dominant side: Secondary | ICD-10-CM

## 2022-12-01 DIAGNOSIS — E440 Moderate protein-calorie malnutrition: Secondary | ICD-10-CM | POA: Insufficient documentation

## 2022-12-01 DIAGNOSIS — N1832 Chronic kidney disease, stage 3b: Secondary | ICD-10-CM | POA: Diagnosis present

## 2022-12-01 DIAGNOSIS — R54 Age-related physical debility: Secondary | ICD-10-CM | POA: Diagnosis present

## 2022-12-01 DIAGNOSIS — J90 Pleural effusion, not elsewhere classified: Secondary | ICD-10-CM | POA: Diagnosis not present

## 2022-12-01 DIAGNOSIS — M24562 Contracture, left knee: Secondary | ICD-10-CM | POA: Diagnosis present

## 2022-12-01 DIAGNOSIS — D72829 Elevated white blood cell count, unspecified: Secondary | ICD-10-CM | POA: Diagnosis not present

## 2022-12-01 DIAGNOSIS — R338 Other retention of urine: Secondary | ICD-10-CM | POA: Diagnosis present

## 2022-12-01 DIAGNOSIS — R29898 Other symptoms and signs involving the musculoskeletal system: Secondary | ICD-10-CM | POA: Diagnosis not present

## 2022-12-01 DIAGNOSIS — I69141 Monoplegia of lower limb following nontraumatic intracerebral hemorrhage affecting right dominant side: Secondary | ICD-10-CM

## 2022-12-01 DIAGNOSIS — R918 Other nonspecific abnormal finding of lung field: Secondary | ICD-10-CM | POA: Diagnosis not present

## 2022-12-01 DIAGNOSIS — I6990 Unspecified sequelae of unspecified cerebrovascular disease: Secondary | ICD-10-CM

## 2022-12-01 DIAGNOSIS — Z91013 Allergy to seafood: Secondary | ICD-10-CM

## 2022-12-01 LAB — BLOOD GAS, ARTERIAL
Acid-base deficit: 5.7 mmol/L — ABNORMAL HIGH (ref 0.0–2.0)
Bicarbonate: 15.6 mmol/L — ABNORMAL LOW (ref 20.0–28.0)
Drawn by: 27016
FIO2: 100 %
O2 Saturation: 97.1 %
Patient temperature: 37.8
pCO2 arterial: 22 mm[Hg] — ABNORMAL LOW (ref 32–48)
pH, Arterial: 7.47 — ABNORMAL HIGH (ref 7.35–7.45)
pO2, Arterial: 74 mm[Hg] — ABNORMAL LOW (ref 83–108)

## 2022-12-01 LAB — RESP PANEL BY RT-PCR (RSV, FLU A&B, COVID)  RVPGX2
Influenza A by PCR: NEGATIVE
Influenza B by PCR: NEGATIVE
Resp Syncytial Virus by PCR: NEGATIVE
SARS Coronavirus 2 by RT PCR: NEGATIVE

## 2022-12-01 LAB — CBC WITH DIFFERENTIAL/PLATELET
Abs Immature Granulocytes: 0.1 10*3/uL — ABNORMAL HIGH (ref 0.00–0.07)
Basophils Absolute: 0.1 10*3/uL (ref 0.0–0.1)
Basophils Relative: 0 %
Eosinophils Absolute: 0.1 10*3/uL (ref 0.0–0.5)
Eosinophils Relative: 0 %
HCT: 46.6 % (ref 39.0–52.0)
Hemoglobin: 15.3 g/dL (ref 13.0–17.0)
Immature Granulocytes: 1 %
Lymphocytes Relative: 8 %
Lymphs Abs: 1.7 10*3/uL (ref 0.7–4.0)
MCH: 29.3 pg (ref 26.0–34.0)
MCHC: 32.8 g/dL (ref 30.0–36.0)
MCV: 89.1 fL (ref 80.0–100.0)
Monocytes Absolute: 1.4 10*3/uL — ABNORMAL HIGH (ref 0.1–1.0)
Monocytes Relative: 7 %
Neutro Abs: 17.8 10*3/uL — ABNORMAL HIGH (ref 1.7–7.7)
Neutrophils Relative %: 84 %
Platelets: 398 10*3/uL (ref 150–400)
RBC: 5.23 MIL/uL (ref 4.22–5.81)
RDW: 15.2 % (ref 11.5–15.5)
WBC: 21.2 10*3/uL — ABNORMAL HIGH (ref 4.0–10.5)
nRBC: 0 % (ref 0.0–0.2)

## 2022-12-01 LAB — PROTIME-INR
INR: 1.3 — ABNORMAL HIGH (ref 0.8–1.2)
Prothrombin Time: 16.4 s — ABNORMAL HIGH (ref 11.4–15.2)

## 2022-12-01 LAB — LACTIC ACID, PLASMA
Lactic Acid, Venous: 3.9 mmol/L (ref 0.5–1.9)
Lactic Acid, Venous: 5.2 mmol/L (ref 0.5–1.9)
Lactic Acid, Venous: 2.1 mmol/L (ref 0.5–1.9)

## 2022-12-01 LAB — COMPREHENSIVE METABOLIC PANEL
ALT: 15 U/L (ref 0–44)
AST: 22 U/L (ref 15–41)
Albumin: 3.6 g/dL (ref 3.5–5.0)
Alkaline Phosphatase: 120 U/L (ref 38–126)
Anion gap: 18 — ABNORMAL HIGH (ref 5–15)
BUN: 74 mg/dL — ABNORMAL HIGH (ref 8–23)
CO2: 19 mmol/L — ABNORMAL LOW (ref 22–32)
Calcium: 10.4 mg/dL — ABNORMAL HIGH (ref 8.9–10.3)
Chloride: 100 mmol/L (ref 98–111)
Creatinine, Ser: 5.42 mg/dL — ABNORMAL HIGH (ref 0.61–1.24)
GFR, Estimated: 10 mL/min — ABNORMAL LOW (ref >60)
Glucose, Bld: 161 mg/dL — ABNORMAL HIGH (ref 70–99)
Potassium: 4.8 mmol/L (ref 3.5–5.1)
Sodium: 137 mmol/L (ref 135–145)
Total Bilirubin: 0.9 mg/dL (ref <1.2)
Total Protein: 8.8 g/dL — ABNORMAL HIGH (ref 6.5–8.1)

## 2022-12-01 LAB — URINALYSIS, W/ REFLEX TO CULTURE (INFECTION SUSPECTED)
Bilirubin Urine: NEGATIVE
Glucose, UA: NEGATIVE mg/dL
Ketones, ur: NEGATIVE mg/dL
Nitrite: NEGATIVE
Protein, ur: >=300 mg/dL — AB
RBC / HPF: >50 RBC/hpf (ref 0–5)
Specific Gravity, Urine: 1.011 (ref 1.005–1.030)
WBC, UA: >50 WBC/hpf (ref 0–5)
pH: 8 (ref 5.0–8.0)

## 2022-12-01 LAB — APTT: aPTT: 33 s (ref 24–36)

## 2022-12-01 LAB — CBG MONITORING, ED
Glucose-Capillary: 134 mg/dL — ABNORMAL HIGH (ref 70–99)
Glucose-Capillary: 107 mg/dL — ABNORMAL HIGH (ref 70–99)
Glucose-Capillary: 98 mg/dL (ref 70–99)

## 2022-12-01 LAB — GLUCOSE, CAPILLARY: Glucose-Capillary: 91 mg/dL (ref 70–99)

## 2022-12-01 MED ORDER — CHLORHEXIDINE GLUCONATE CLOTH 2 % EX PADS
6.0000 | MEDICATED_PAD | Freq: Every day | CUTANEOUS | Status: DC
Start: 2022-12-01 — End: 2022-12-07
  Administered 2022-12-01 – 2022-12-06 (×6): 6 via TOPICAL

## 2022-12-01 MED ORDER — FAMOTIDINE 20 MG PO TABS
20.0000 mg | ORAL_TABLET | Freq: Every day | ORAL | Status: DC
  Administered 2022-12-03 – 2022-12-06 (×3): 20 mg via ORAL
  Filled 2022-12-01 (×4): qty 1

## 2022-12-01 MED ORDER — INSULIN ASPART 100 UNIT/ML IJ SOLN
0.0000 [IU] | Freq: Three times a day (TID) | INTRAMUSCULAR | Status: DC
  Administered 2022-12-03: 1 [IU] via SUBCUTANEOUS

## 2022-12-01 MED ORDER — VANCOMYCIN VARIABLE DOSE PER UNSTABLE RENAL FUNCTION (PHARMACIST DOSING): Status: DC

## 2022-12-01 MED ORDER — SODIUM CHLORIDE 0.9 % IV SOLN
2.0000 g | Freq: Once | INTRAVENOUS | Status: AC
  Administered 2022-12-01: 2 g via INTRAVENOUS
  Filled 2022-12-01: qty 12.5

## 2022-12-01 MED ORDER — ONDANSETRON HCL 4 MG PO TABS: 4.0000 mg | ORAL_TABLET | Freq: Four times a day (QID) | ORAL | Status: DC | PRN

## 2022-12-01 MED ORDER — SODIUM CHLORIDE 0.9 % IV BOLUS
500.0000 mL | Freq: Once | INTRAVENOUS | Status: AC
  Administered 2022-12-01: 500 mL via INTRAVENOUS

## 2022-12-01 MED ORDER — ACETAMINOPHEN 650 MG RE SUPP
650.0000 mg | Freq: Four times a day (QID) | RECTAL | Status: DC | PRN
  Administered 2022-12-01: 650 mg via RECTAL
  Filled 2022-12-01: qty 1

## 2022-12-01 MED ORDER — ACETAMINOPHEN 325 MG PO TABS: 650.0000 mg | ORAL_TABLET | Freq: Four times a day (QID) | ORAL | Status: DC | PRN

## 2022-12-01 MED ORDER — LACTATED RINGERS IV BOLUS (SEPSIS)
1000.0000 mL | Freq: Once | INTRAVENOUS | Status: AC
  Administered 2022-12-01: 1000 mL via INTRAVENOUS

## 2022-12-01 MED ORDER — VANCOMYCIN HCL 1500 MG/300ML IV SOLN
1500.0000 mg | Freq: Once | INTRAVENOUS | Status: AC
  Administered 2022-12-01: 1500 mg via INTRAVENOUS
  Filled 2022-12-01: qty 300

## 2022-12-01 MED ORDER — FENTANYL CITRATE PF 50 MCG/ML IJ SOSY: 12.5000 ug | PREFILLED_SYRINGE | INTRAMUSCULAR | Status: DC | PRN

## 2022-12-01 MED ORDER — FLUOXETINE HCL 20 MG PO CAPS
20.0000 mg | ORAL_CAPSULE | Freq: Every day | ORAL | Status: DC
  Administered 2022-12-03 – 2022-12-06 (×3): 20 mg via ORAL
  Filled 2022-12-01 (×4): qty 1

## 2022-12-01 MED ORDER — METOPROLOL SUCCINATE ER 25 MG PO TB24
25.0000 mg | ORAL_TABLET | Freq: Two times a day (BID) | ORAL | Status: DC
  Administered 2022-12-02 – 2022-12-06 (×6): 25 mg via ORAL
  Filled 2022-12-01 (×7): qty 1

## 2022-12-01 MED ORDER — SODIUM CHLORIDE 0.9 % IV SOLN
2.0000 g | INTRAVENOUS | Status: AC
  Administered 2022-12-02 – 2022-12-06 (×5): 2 g via INTRAVENOUS
  Filled 2022-12-01 (×5): qty 12.5

## 2022-12-01 MED ORDER — METRONIDAZOLE 500 MG/100ML IV SOLN
500.0000 mg | Freq: Once | INTRAVENOUS | Status: AC
  Administered 2022-12-01: 500 mg via INTRAVENOUS
  Filled 2022-12-01: qty 100

## 2022-12-01 MED ORDER — VANCOMYCIN HCL IN DEXTROSE 1-5 GM/200ML-% IV SOLN: 1000.0000 mg | Freq: Once | INTRAVENOUS | Status: DC

## 2022-12-01 MED ORDER — HEPARIN SODIUM (PORCINE) 5000 UNIT/ML IJ SOLN
5000.0000 [IU] | Freq: Two times a day (BID) | INTRAMUSCULAR | Status: DC
  Filled 2022-12-01: qty 1

## 2022-12-01 MED ORDER — LACTATED RINGERS IV SOLN: INTRAVENOUS | Status: DC

## 2022-12-01 MED ORDER — LACTATED RINGERS IV BOLUS
1000.0000 mL | Freq: Once | INTRAVENOUS | Status: AC
  Administered 2022-12-01: 1000 mL via INTRAVENOUS

## 2022-12-01 MED ORDER — BISACODYL 10 MG RE SUPP: 10.0000 mg | Freq: Every day | RECTAL | Status: DC | PRN

## 2022-12-01 MED ORDER — NITROGLYCERIN 0.4 MG SL SUBL: 0.4000 mg | SUBLINGUAL_TABLET | SUBLINGUAL | Status: DC | PRN

## 2022-12-01 MED ORDER — ONDANSETRON HCL 4 MG/2ML IJ SOLN: 4.0000 mg | Freq: Four times a day (QID) | INTRAMUSCULAR | Status: DC | PRN

## 2022-12-01 MED ORDER — BISACODYL 5 MG PO TBEC: 5.0000 mg | DELAYED_RELEASE_TABLET | Freq: Every day | ORAL | Status: DC | PRN

## 2022-12-01 MED ORDER — SIMVASTATIN 20 MG PO TABS
40.0000 mg | ORAL_TABLET | Freq: Every day | ORAL | Status: DC
  Administered 2022-12-03 – 2022-12-06 (×4): 40 mg via ORAL
  Filled 2022-12-01 (×5): qty 2

## 2022-12-01 MED ORDER — OXYCODONE HCL 5 MG PO TABS: 2.5000 mg | ORAL_TABLET | Freq: Four times a day (QID) | ORAL | Status: DC | PRN

## 2022-12-01 MED ORDER — LACTATED RINGERS IV BOLUS (SEPSIS)
250.0000 mL | Freq: Once | INTRAVENOUS | Status: AC
  Administered 2022-12-01: 250 mL via INTRAVENOUS

## 2022-12-01 MED ORDER — SODIUM BICARBONATE 650 MG PO TABS
650.0000 mg | ORAL_TABLET | Freq: Two times a day (BID) | ORAL | Status: DC
  Administered 2022-12-02 – 2022-12-05 (×4): 650 mg via ORAL
  Filled 2022-12-01 (×5): qty 1

## 2022-12-01 MED ORDER — MELATONIN 3 MG PO TABS
6.0000 mg | ORAL_TABLET | Freq: Every day | ORAL | Status: DC
  Administered 2022-12-02 – 2022-12-05 (×3): 6 mg via ORAL
  Filled 2022-12-01 (×3): qty 2

## 2022-12-01 MED ORDER — ACETAMINOPHEN 650 MG RE SUPP
650.0000 mg | Freq: Once | RECTAL | Status: AC
  Administered 2022-12-01: 650 mg via RECTAL
  Filled 2022-12-01: qty 1

## 2022-12-01 MED ORDER — MUSCLE RUB 10-15 % EX CREA
1.0000 | TOPICAL_CREAM | Freq: Two times a day (BID) | CUTANEOUS | Status: DC
  Administered 2022-12-02 – 2022-12-06 (×8): 1 via TOPICAL
  Filled 2022-12-01: qty 85

## 2022-12-01 NOTE — ED Notes (Signed)
Patient transported to X-ray 

## 2022-12-01 NOTE — ED Notes (Signed)
EDP and RT at bedside. 

## 2022-12-01 NOTE — ED Notes (Signed)
MD, Pricilla Loveless, changed suprapubic catheter out. Site cleansed before insertion of new catheter. 20 Fr, urine return noted

## 2022-12-01 NOTE — ED Notes (Signed)
Pt had bm. Incontinent care provided and pt repositioned. Wife at bedside.

## 2022-12-01 NOTE — H&P (Addendum)
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  He has a dysphagia, dysphagia, chronic contractures in the lower extremities, history of bedsore, cognitive communication deficit, chronic neurogenic bladder with indwelling suprapubic catheter, hypertension, GERD, history of A-fib, history of second-degree AV block status post pacemaker in 2018, stage III CKD, right hemiplegia, generalized muscle weakness, history of gastrostomy subsequently removed, abnormal posture, bedbound status, OSA on nightly BiPAP, long-term care resident in [place].  His wife reports that she visits him daily at the facility.  She noticed yesterday that he was sleeping most of the day.  She thought it was due to the higher dose of melatonin that had been given to him over the last couple of days to help him sleep better and tolerate his BiPAP better.  When she came to visit him today she found him to look extremely unwell.  He had a fever.  He had respiratory distress.  He was pale and listless and noncommunicative.  EMS was called and he was placed on a nonrebreather and transported to the ED with severe sepsis with septic shock.  They determined that his suprapubic catheter was blocked and it was replaced and subsequently a liter of sediment contaminated fluid was eliminated.  His lactic acid was markedly elevated up to 5.2.  He was started on broad-spectrum antibiotic coverage and sepsis fluid boluses given.  His chest x-ray showed no active disease.  His  urinalysis was significant for findings of infection.  CT brain did not show any acute findings.  The patient was noted to be significantly somnolent.  His blood pressures were notably soft.  Patient is being admitted to the stepdown ICU for treatment of severe sepsis with septic shock.    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HCC)    Aphasia following cerebral infarction    Atherosclerotic heart disease of native coronary artery without angina pectoris    Benign prostatic hyperplasia with lower urinary tract symptoms    Benign prostatic hyperplasia without lower urinary tract symptoms    Bradycardia, unspecified    Cerebral infarction (HCC)    Chronic kidney disease, stage 3 unspecified (HCC)    Cognitive communication deficit    Dysarthria    Dysphagia    Encephalopathy    Essential (primary) hypertension    Gastro-esophageal reflux disease without esophagitis    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HCC)    Hemiplegia (HCC)    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)    Hyperlipidemia    Hyperosmolality and hypernatremia    Muscle weakness (generalized)    Myocardial infarct, old    Neuromuscular dysfunction of bladder, unspecified    Nontraumatic intracerebral hemorrhage (HCC)    Nontraumatic intracerebral hemorrhage (HCC)    Obstructive sleep apnea    Paroxysmal atrial fibrillation (HCC)    Presence of cardiac pacemaker    Presence of coronary angioplasty implant and graft    Pressure ulcer    Renovascular hypertension    Retention of urine    Seizures (HCC)  TIA (transient ischemic attack)    Type 2 diabetes mellitus without complication (HCC)    Unspecified atrial fibrillation (HCC)    Unspecified disorder of synovium and tendon, right shoulder    Unspecified lack of coordination    Weakness     Past Surgical History:  Procedure Laterality Date   FEMUR SURGERY Left    HIP  SURGERY Left    IR GASTROSTOMY TUBE REMOVAL  02/26/2022   SPINAL CORD STIMULATOR IMPLANT     per wife nerve stimulator in back     reports that he has never smoked. He has never used smokeless tobacco. He reports that he does not currently use alcohol. He reports that he does not use drugs.  Allergies  Allergen Reactions   Asa [Aspirin] Other (See Comments)    Hx of aneurysm, told to avoid by MD   Other Other (See Comments)    Blood thinners- wife wants documented that patient should not be on blood thinners due to past medical history (Hx of aneurysm) Red meat - causes gout    Shrimp (Diagnostic) Other (See Comments)    Gout flare    History reviewed. No pertinent family history.  Prior to Admission medications   Medication Sig Start Date End Date Taking? Authorizing Provider  acetaminophen (TYLENOL) 325 MG tablet Take 650 mg by mouth every 4 (four) hours as needed for mild pain or fever.   Yes [provider]  AMBULATORY NON FORMULARY MEDICATION 1 Device by Does not apply route at bedtime. BiPap   Yes [provider]  amLODipine (NORVASC) 10 MG tablet Take 10 mg by mouth daily. 04/12/21  Yes [provider]  ascorbic acid (VITAMIN C) 500 MG tablet Take 500 mg by mouth daily.   Yes [provider]  Baclofen 5 MG TABS Take 5 mg by mouth 3 (three) times daily.   Yes [provider]  bisacodyl (DULCOLAX) 10 MG suppository Place 10 mg rectally daily as needed (constipation). 11/17/20  Yes [provider]  famotidine (PEPCID) 20 MG tablet Take 20 mg by mouth daily. 08/01/22  Yes [provider]  fexofenadine (ALLEGRA) 180 MG tablet Take 180 mg by mouth daily as needed for allergies or rhinitis.   Yes [provider]  FLUoxetine (PROZAC) 20 MG capsule Take 20 mg by mouth daily. 05/04/21  Yes [provider]  hydrALAZINE (APRESOLINE) 25 MG tablet Take 25 mg by mouth every 8 (eight) hours. 04/12/21  Yes [provider]  lisinopril (ZESTRIL) 40 MG tablet Take 40 mg by mouth daily. 04/12/21  Yes [provider]  loperamide (IMODIUM A-D) 2 MG tablet Take 2-4 mg by mouth See admin instructions. 2 entries on MAR: 1) Take 2 mg every 6 hours as needed for diarrhea, max of 8 mg in 24 hours. 2) Take 4 mg every 12 hours as needed for diarrhea/after 1st loose stool, max of 8 mg in 24 hours.   Yes [provider]  melatonin 5 MG TABS Take 5 mg by mouth at bedtime.   Yes [provider]  Menthol, Topical Analgesic, 4 % GEL Apply 1 application  topically 2 (two) times daily. To both legs   Yes [provider]  metoprolol succinate (TOPROL-XL) 50 MG 24 hr tablet Take 50 mg by mouth 2 (two) times daily. 06/15/21  Yes [provider]  nitroGLYCERIN (NITROSTAT) 0.4 MG SL tablet Place 0.4 mg under the tongue every 5 (five) minutes as needed for chest pain.  Yes [provider]  NON FORMULARY Take 1 Container by mouth 2 (two) times daily. Magic Cups   Yes [provider]  Nutritional Supplement LIQD Take 120 mLs by mouth 2 (two) times daily. House Supplement   Yes [provider]  ondansetron (ZOFRAN) 4 MG tablet Take 4 mg by mouth every 12 (twelve) hours as needed for vomiting or nausea. 06/28/21  Yes [provider]  simvastatin (ZOCOR) 40 MG tablet Take 40 mg by mouth daily at 6 PM. 06/29/22  Yes [provider]  sodium bicarbonate 650 MG tablet Take 650 mg by mouth daily. 11/17/20  Yes [provider]  thiamine 100 MG tablet Take 100 mg by mouth daily. 11/17/20  Yes [provider]    Physical Exam: Vitals:   12/01/22 1329 12/01/22 1330 12/01/22 1400 12/01/22 1415  BP:  98/69 111/67 114/67  Pulse:  87 80 84  Resp:  (!) 24 (!) 23 (!) 22  Temp:      TempSrc:      SpO2:  94% 97% 94%  Weight: 73.3 kg     Height: 5\' 11"  (1.803 m)      Constitutional: acutely and chronically ill appearing, unresponsive,  mouth breathing thru NRB. Severely distressed appearing.   Eyes: PERRL, lids and conjunctivae normal ENMT: Mucous membranes are severely dry. Posterior pharynx with black crusted lesion seen. Partial dentures.  Neck: normal, supple, no masses, no thyromegaly Respiratory: shallow breathing bilateral. No rales heard.   Cardiovascular: tachycardic rate, normal s1, s2 sounds, no murmurs / rubs / gallops. No extremity edema. 2+ pedal pulses. No carotid bruits.  Abdomen: suprapubic catheter in place with cloudy, blood and sediment tinged urine, no tenderness, no masses palpated. No hepatosplenomegaly. Bowel sounds positive.  Musculoskeletal: contractures of bilateral LEs.  Contracture of right arm.  Skin: no rashes, lesions, ulcers. No induration Neurologic: severe contractures of bilateral LEs.   Psychiatric: UTD judgment and insight. Mostly unresponsive except to noxious stimulus.   Labs on Admission: I have personally reviewed following labs and imaging studies  CBC: Recent Labs  Lab 12/01/22 1127  WBC 21.2*  NEUTROABS 17.8*  HGB 15.3  HCT 46.6  MCV 89.1  PLT 398   Basic Metabolic Panel: Recent Labs  Lab 12/01/22 1127  NA 137  K 4.8  CL 100  CO2 19*  GLUCOSE 161*  BUN 74*  CREATININE 5.42*  CALCIUM 10.4*   GFR: Estimated Creatinine Clearance: 11.5 mL/min (A) (by C-G formula based on SCr of 5.42 mg/dL (H)). Liver Function Tests: Recent Labs  Lab 12/01/22 1127  AST 22  ALT 15  ALKPHOS 120  BILITOT 0.9  PROT 8.8*  ALBUMIN 3.6   No results for input(s): "LIPASE", "AMYLASE" in the last 168 hours. No results for input(s): "AMMONIA" in the last 168 hours. Coagulation Profile: Recent Labs  Lab 12/01/22 1127  INR 1.3*   Cardiac Enzymes: No results for input(s): "CKTOTAL", "CKMB", "CKMBINDEX", "TROPONINI" in the last 168 hours. BNP (last 3 results) No results for input(s): "PROBNP" in the last 8760 hours. HbA1C: No results for input(s): "HGBA1C" in the last 72  hours. CBG: Recent Labs  Lab 12/01/22 1136 12/01/22 1407  GLUCAP 134* 107*   Lipid Profile: No results for input(s): "CHOL", "HDL", "LDLCALC", "TRIG", "CHOLHDL", "LDLDIRECT" in the last 72 hours. Thyroid Function Tests: No results for input(s): "TSH", "T4TOTAL", "FREET4", "T3FREE", "THYROIDAB" in the last 72 hours. Anemia Panel: No results for input(s): "VITAMINB12", "FOLATE", "FERRITIN", "TIBC", "IRON", "RETICCTPCT" in the  last 72 hours. Urine analysis:    Component Value Date/Time   APPEARANCEUR Clear 06/29/2021 1121   GLUCOSEU Negative 06/29/2021 1121   BILIRUBINUR Negative 06/29/2021 1121   PROTEINUR Trace (A) 06/29/2021 1121   NITRITE Negative 06/29/2021 1121   LEUKOCYTESUR 3+ (A) 06/29/2021 1121    Radiological Exams on Admission: DG Chest Port 1 View  Result Date: 12/01/2022 CLINICAL DATA:  Fever, hypoxia. EXAM: PORTABLE CHEST 1 VIEW COMPARISON:  Chest CT dated 08/17/2021. FINDINGS: The heart is mildly enlarged. Vascular calcifications are seen in the aortic arch. A left subclavian approach cardiac device is redemonstrated. The lungs are clear. Degenerative changes are seen in the spine. IMPRESSION: No active disease. Cardiomegaly. Electronically Signed   By: Romona Curls M.D.   On: 12/01/2022 13:35   CT Head Wo Contrast  Result Date: 12/01/2022 CLINICAL DATA:  Mental status change, unknown cause EXAM: CT HEAD WITHOUT CONTRAST TECHNIQUE: Contiguous axial images were obtained from the base of the skull through the vertex without intravenous contrast. RADIATION DOSE REDUCTION: This exam was performed according to the departmental dose-optimization program which includes automated exposure control, adjustment of the mA and/or kV according to patient size and/or use of iterative reconstruction technique. COMPARISON:  CT head 05/05/21 FINDINGS: Brain: No hemorrhage. No hydrocephalus. No extra-axial fluid collection. No CT evidence of an acute cortical infarct. No mass effect. No  mass lesion. 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 Skull: Normal. Negative for fracture or focal lesion. Sinuses/Orbits: No middle ear or mastoid effusion. Paranasal sinuses are clear. Left lens replacement. Orbits are otherwise unremarkable. Other: None. IMPRESSION: 1. No hemorrhage or CT evidence of an acute cortical infarct. 2. 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 Electronically Signed   By: Lorenza Cambridge M.D.   On: 12/01/2022 13:04    EKG: Independently reviewed. Sinus tachycardia    Assessment/Plan Principal Problem:   Severe sepsis with septic shock (CODE) (HCC) Active Problems:   Acute urinary retention blockage of suprapubic catheter   Neurogenic bladder   Encounter for care or replacement of suprapubic tube [facility])   Aphasia following nontraumatic intracerebral hemorrhage   Esophageal reflux   Essential hypertension   Gout   History of intracranial hemorrhage   Late effects of cerebrovascular disease   Right hemiparesis (HCC)   Bilateral renal masses   Presence of cardiac pacemaker   Cognitive communication deficit   Chronic kidney disease, stage 3 unspecified (HCC)   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   Renovascular hypertension   Hemiplegia (HCC)   Hyperlipidemia   Muscle weakness (generalized)   Contracture, right knee   Contracture, left knee   Type 2 diabetes mellitus without complications (HCC)   Paroxysmal atrial fibrillation (HCC)   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HCC)   Abnormal posture   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)   Unspecified atrial fibrillation (HCC)   OSA (obstructive sleep apnea)   Lactic acidosis   AKI (acute kidney injury) (HCC)   Hypercalcemia   Leukocytosis   Metabolic acidosis    Acute respiratory failure with hypoxia (HCC)   Fever, unspecified   Severe Sepsis with septic shock  - secondary to urinary tract infection  - obtain urine culture - continue broad spectrum antibiotic coverage with IV cefepime dosed by pharm D - de-escalate antibiotics based on urine culture and  sensitivity findings - sepsis orderset bundle fluid hydration ordered - trend lactate - continue aggressive supportive measures - admit to stepdown ICU  Acute respiratory failure with hypoxia  - he is currently on nonrebreather - wean as able  - continue nightly bipap therapy  - ABG was reassuring - no s/s of pneumonia on initial CXR but he is severely dehydrated - recheck pCXR in AM after he has been hydrated   Lactic Acidosis - severe - repeat lactate at 5pm after more hydration given - may need to re-bolus patient if remains elevated   AKI on CKD stage 3b  - related to bladder outlet obstruction which is now relieved and severe dehydration  - treating with IV fluids  - hold lisinopril  - renally dose meds as able  - avoid nephrotoxins  Acute urinary retention  - relieved after changing out suprapubic catheter in ED by Dr. Criss Alvine on 11/10 - monitor urine output closely   Dysphagia/Dysarthria  - these are late effects from his devastating intracerebral hemorrage in 2022 - wife reports that he is on dysphagia 3 diet with nectar thickened liquids - dys 3 diet ordered   Leukocytosis  - secondary to UTI with sepsis - trend CBC with differential with treatment   Hypercalcemia - Hydrating with IV normal saline first and then plan to recheck CMP - if remains elevated will escalate calcium lowering therapy   Paroxysmal atrial fibrillation  - he is no longer anticoagulated due to intracerebral hemorrhage - resumed metoprolol succinate but reduced dose by 50% due to soft BPs - follow on cardiac monitor   OSA - he is cared for by sleep specialist Dr. Vassie Loll - he is currently on  nightly bipap but his compliance has been poor per wife - we have reordered nightly bipap for inpatient care  Type 2 diabetes mellitus  - for now we will ordered very sensitive SSI coverage given severe AKI (GFR down to 11) - frequent CBG monitoring in hopes to avoid hypoglycemia   Bilateral renal masses  - pt is followed by Dr. Ellin Saba for annual CT surveillance - wife reports pt had an ablation done on his right kidney years ago  GERD - famotidine ordered for GI protection   Goals of care - given severe co-morbidities and frailty would benefit from goals of care discussion, consulted palliative medicine team  Critical Care Procedure Note Authorized and Performed by: Maryln Manuel MD  Total Critical Care time:  73 mins Due to a high probability of clinically significant, life threatening deterioration, the patient required my highest level of preparedness to intervene emergently and I personally spent this critical care time directly and personally managing the patient.  This critical care time included obtaining a history; examining the patient, pulse oximetry; ordering and review of studies; arranging urgent treatment with development of a management plan; evaluation of patient's response of treatment; frequent reassessment; and discussions with other providers.  This critical care time was performed to assess and manage the high probability of imminent and life threatening deterioration that could result in multi-organ failure.  It was exclusive of separately billable procedures and treating other patients and teaching time.   DVT prophylaxis: SQ heparin   Code Status: Full, confirmed with wife   Family Communication: wife at bedside   Disposition Plan: return to [facility] when medically stabilized   Consults called:  palliative medicine Admission status: INP  Level of care: Clearence Ped MD [facility] How to contact the  [facility] Attending or Consulting  provider 7A - 7P or covering provider during after hours 7P -7A, for this patient?  Check the care team in [facility] and look for a) attending/consulting [facility] provider listed and b) the [facility] team listed Log into [email or web address] and use [place]'s universal password to access. If you do not have the password, please contact the hospital operator. Locate the [facility] provider you are looking for under [facility] and page to a number that you can be directly reached. If you still have difficulty reaching the provider, please page the [facility] (Director on Call) for the Hospitalists listed on amion for assistance.   If 7PM-7AM, please contact night-coverage [email or web address] Password [facility]  12/01/2022, 2:46 PM

## 2022-12-01 NOTE — ED Triage Notes (Signed)
Pt arrived via [facility] from [place] c/o respiratory distress and fever. Pt uses a cpap at night when he sleeps, has not kept it on the last 2 nights when sleeping.pt is on  4 L [place] at baseline, has a pacemaker, 80% RA on EMS arrival to SNF, EMS placed on non-rebreather at 15 L 93%

## 2022-12-01 NOTE — Sepsis Progress Note (Signed)
Sepsis protocol is being followed by [facility]. 

## 2022-12-01 NOTE — ED Notes (Signed)
Suprapubic catheter in place, Per EMS, wife stated that he did not have any urine output yesterday

## 2022-12-01 NOTE — ED Notes (Signed)
AC to bring down 20 Fr foley  catheter so MD can change his suprapubic catheter

## 2022-12-01 NOTE — Hospital Course (Signed)
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  He has a dysphagia, dysphagia, chronic contractures in the lower extremities, history of bedsore, cognitive communication deficit, chronic neurogenic bladder with indwelling suprapubic catheter, hypertension, GERD, history of A-fib, history of second-degree AV block status post pacemaker in 2018, stage III CKD, right hemiplegia, generalized muscle weakness, history of gastrostomy subsequently removed, abnormal posture, bedbound status, OSA on nightly BiPAP, long-term care resident in [place].  His wife reports that she visits him daily at the facility.  She noticed yesterday that he was sleeping most of the day.  She thought it was due to the higher dose of melatonin that had been given to him over the last couple of days to help him sleep better and tolerate his BiPAP better.  When she came to visit him today she found him to look extremely unwell.  He had a fever.  He had respiratory distress.  He was pale and listless and noncommunicative.  EMS was called and he was placed on a nonrebreather and transported to the ED with severe sepsis with septic shock.  They determined that his suprapubic catheter was blocked and it was replaced and subsequently a liter of sediment contaminated fluid was eliminated.  His lactic acid was markedly elevated up to 5.2.  He was started on broad-spectrum antibiotic coverage and sepsis fluid boluses given.  His chest x-ray showed no active disease.  His urinalysis was significant for findings of infection.  CT brain did not show any acute findings.  The patient was noted to be significantly somnolent.  His blood pressures were notably soft.  Patient is being admitted to the stepdown ICU for treatment of severe sepsis with septic shock.

## 2022-12-01 NOTE — ED Provider Notes (Signed)
[place] EMERGENCY DEPARTMENT AT [facility] Provider Note   CSN: [id number] Arrival date & time: 12/01/22  1108     History  Chief Complaint  Patient presents with   Respiratory Distress    Dylan Martin is a 77 y.o. male.  HPI 77 year old male presents from his living facility with altered mental status.  Patient was found to have respiratory distress and a fever.  He has not been wearing his CPAP like he supposed to over the last few nights according to EMS.  He was 80% on room air for EMS and they placed him on a nonrebreather.  He is altered compared to his baseline.  At this point, history is otherwise quite limited.  Patient's wife states that patient was given a larger dose of melatonin at her request 2 nights ago due to poor sleep and taking off the CPAP at night.  He has been sleeping much more since yesterday.  She was able to get him up to eat.  He then fell back asleep.  No reports of fevers and she is not sure about any vomiting.  She states that he is very functional at baseline and was even able to stand with PT as recently as this week.  He can feed himself and speaks to her.  Home Medications Prior to Admission medications   Medication Sig Start Date End Date Taking? Authorizing Provider  acetaminophen (TYLENOL) 325 MG tablet Take 650 mg by mouth every 4 (four) hours as needed for mild pain or fever.   Yes [provider]  AMBULATORY NON FORMULARY MEDICATION 1 Device by Does not apply route at bedtime. BiPap   Yes [provider]  amLODipine (NORVASC) 10 MG tablet Take 10 mg by mouth daily. 04/12/21  Yes [provider]  ascorbic acid (VITAMIN C) 500 MG tablet Take 500 mg by mouth daily.   Yes [provider]  Baclofen 5 MG TABS Take 5 mg by mouth 3 (three) times daily.   Yes [provider]  bisacodyl (DULCOLAX) 10 MG suppository Place 10 mg rectally daily as needed (constipation). 11/17/20  Yes [provider]  famotidine (PEPCID) 20 MG tablet Take 20 mg by mouth daily. 08/01/22  Yes [provider]  fexofenadine (ALLEGRA) 180 MG tablet Take 180 mg by mouth daily as needed for allergies or rhinitis.   Yes [provider]  FLUoxetine (PROZAC) 20 MG capsule Take 20 mg by mouth daily. 05/04/21  Yes [provider]  hydrALAZINE (APRESOLINE) 25 MG tablet Take 25 mg by mouth every 8 (eight) hours. 04/12/21  Yes [provider]  lisinopril (ZESTRIL) 40 MG tablet Take 40 mg by mouth daily. 04/12/21  Yes [provider]  loperamide (IMODIUM A-D) 2 MG tablet Take 2-4 mg by mouth See admin instructions. 2 entries on MAR: 1) Take 2 mg every 6 hours as needed for diarrhea, max of 8 mg in 24 hours. 2) Take 4 mg every 12 hours as needed for diarrhea/after 1st loose stool, max of 8 mg in 24 hours.   Yes [provider]  melatonin 5 MG TABS Take 5 mg by mouth at bedtime.   Yes [provider]  Menthol, Topical Analgesic, 4 % GEL Apply 1 application  topically 2 (two) times daily. To both legs   Yes [provider]  metoprolol succinate (TOPROL-XL) 50 MG 24 hr tablet Take 50 mg by mouth 2 (two) times daily. 06/15/21  Yes [provider]  nitroGLYCERIN (NITROSTAT) 0.4 MG SL tablet Place 0.4 mg under the tongue every 5 (five) minutes as needed for chest pain.   Yes [provider]  NON FORMULARY Take 1 Container by mouth 2 (two) times daily. Magic Cups   Yes [provider]  Nutritional Supplement LIQD Take 120 mLs by mouth 2 (two) times daily. House Supplement   Yes [provider]  ondansetron (ZOFRAN) 4 MG tablet Take 4 mg by mouth every 12 (twelve) hours as needed for vomiting or nausea. 06/28/21  Yes [provider]  simvastatin (ZOCOR) 40 MG tablet Take 40 mg by mouth daily at 6 PM. 06/29/22  Yes [provider]  sodium bicarbonate 650 MG tablet Take 650 mg by mouth daily. 11/17/20  Yes  [provider]  thiamine 100 MG tablet Take 100 mg by mouth daily. 11/17/20  Yes [provider]      Allergies    Asa [aspirin], Other, and Shrimp (diagnostic)    Review of Systems   Review of Systems  Unable to perform ROS: Mental status change    Physical Exam Updated Vital Signs BP 114/67   Pulse 84   Temp (!) 101.1 F (38.4 C) (Rectal)   Resp (!) 22   Ht 5\' 11"  (1.803 m)   Wt 73.3 kg   SpO2 94%   BMI 22.54 kg/m  Physical Exam Vitals and nursing note reviewed.  Constitutional:      Appearance: He is well-developed. He is ill-appearing. He is not diaphoretic.  HENT:     Head: Normocephalic and atraumatic.  Cardiovascular:     Rate and Rhythm: Regular rhythm. Tachycardia present.     Heart sounds: Normal heart sounds.  Pulmonary:     Effort: Tachypnea present.     Breath sounds: Examination of the right-middle field reveals rhonchi. Examination of the right-lower field reveals rhonchi. Rhonchi present.  Abdominal:     Palpations: Abdomen is soft.     Comments: Suprapubic catheter in place, mild amount of drainage around the catheter. Only a few drops of urine in the bag  Skin:    General: Skin is warm and dry.  Neurological:     Mental Status: He is alert.     Comments: Patient is awake but not following commands. Is frequently moving and turning in the bed. Not redirectable.  Has contractures     ED Results / Procedures / Treatments   Labs (all labs ordered are listed, but only abnormal results are displayed) Labs Reviewed  LACTIC ACID, PLASMA - Abnormal; Notable for the following components:      Result Value   Lactic Acid, Venous 3.9 (*)    All other components within normal limits  LACTIC ACID, PLASMA - Abnormal; Notable for the following components:   Lactic Acid, Venous 5.2 (*)    All other components within normal limits  COMPREHENSIVE METABOLIC PANEL - Abnormal; Notable for the following components:   CO2 19 (*)    Glucose, Bld  161 (*)    BUN 74 (*)    Creatinine, Ser 5.42 (*)    Calcium 10.4 (*)    Total Protein 8.8 (*)    GFR, Estimated 10 (*)    Anion gap 18 (*)    All other components within normal limits  CBC WITH DIFFERENTIAL/PLATELET - Abnormal; Notable for the following components:   WBC 21.2 (*)    Neutro Abs 17.8 (*)    Monocytes Absolute 1.4 (*)    Abs  Immature Granulocytes 0.10 (*)    All other components within normal limits  PROTIME-INR - Abnormal; Notable for the following components:   Prothrombin Time 16.4 (*)    INR 1.3 (*)    All other components within normal limits  BLOOD GAS, ARTERIAL - Abnormal; Notable for the following components:   pH, Arterial 7.47 (*)    pCO2 arterial 22 (*)    pO2, Arterial 74 (*)    Bicarbonate 15.6 (*)    Acid-base deficit 5.7 (*)    All other components within normal limits  CBG MONITORING, ED - Abnormal; Notable for the following components:   Glucose-Capillary 134 (*)    All other components within normal limits  CBG MONITORING, ED - Abnormal; Notable for the following components:   Glucose-Capillary 107 (*)    All other components within normal limits  CULTURE, BLOOD (ROUTINE X 2)  CULTURE, BLOOD (ROUTINE X 2)  RESP PANEL BY RT-PCR (RSV, FLU A&B, COVID)  RVPGX2  APTT  URINALYSIS, W/ REFLEX TO CULTURE (INFECTION SUSPECTED)  I-STAT CHEM 8, ED    EKG EKG Interpretation Date/Time:  Sunday December 01 2022 11:17:08 EST Ventricular Rate:  120 PR Interval:  141 QRS Duration:  151 QT Interval:  369 QTC Calculation: 522 R Axis:   -71  Text Interpretation: Sinus tachycardia Ventricular premature complex LAE, consider biatrial enlargement Left bundle branch block Artifact in lead(s) I V2 V6 Confirmed by Pricilla Loveless [id number]) on 12/01/2022 2:35:41 PM  Radiology DG Chest Port 1 View  Result Date: 12/01/2022 CLINICAL DATA:  Fever, hypoxia. EXAM: PORTABLE CHEST 1 VIEW COMPARISON:  Chest CT dated 08/17/2021. FINDINGS: The heart is mildly enlarged.  Vascular calcifications are seen in the aortic arch. A left subclavian approach cardiac device is redemonstrated. The lungs are clear. Degenerative changes are seen in the spine. IMPRESSION: No active disease. Cardiomegaly. Electronically Signed   By: Romona Curls M.D.   On: 12/01/2022 13:35   CT Head Wo Contrast  Result Date: 12/01/2022 CLINICAL DATA:  Mental status change, unknown cause EXAM: CT HEAD WITHOUT CONTRAST TECHNIQUE: Contiguous axial images were obtained from the base of the skull through the vertex without intravenous contrast. RADIATION DOSE REDUCTION: This exam was performed according to the departmental dose-optimization program which includes automated exposure control, adjustment of the mA and/or kV according to patient size and/or use of iterative reconstruction technique. COMPARISON:  CT head 05/05/21 FINDINGS: Brain: No hemorrhage. No hydrocephalus. No extra-axial fluid collection. No CT evidence of an acute cortical infarct. No mass effect. No mass lesion. 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 Skull: Normal. Negative for fracture or focal lesion. Sinuses/Orbits: No middle ear or mastoid effusion. Paranasal sinuses are clear. Left lens replacement. Orbits are otherwise unremarkable. Other: None. IMPRESSION: 1. No hemorrhage or CT evidence of an acute cortical infarct. 2. 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 Electronically Signed   By: Lorenza Cambridge M.D.   On: 12/01/2022 13:04    Procedures SUPRAPUBIC TUBE PLACEMENT  Date/Time: 12/01/2022 2:03 PM  Performed by: Pricilla Loveless, MD Authorized by: Pricilla Loveless, MD   Consent:    Consent obtained:  Verbal   Consent given by:  Spouse Sedation:    Sedation type:  None Anesthesia:    Anesthesia method:  None Procedure  details:    Complexity:  Simple   Catheter type:  Foley   Catheter size:  20 Fr   Number of attempts:  1   Urine characteristics:  Yellow and cloudy Post-procedure details:    Procedure completion:  Tolerated well, no immediate complications .Critical Care  Performed by: Pricilla Loveless, MD Authorized by: Pricilla Loveless, MD   Critical care provider statement:    Critical care time (minutes):  50   Critical care time was exclusive of:  Separately billable procedures and treating other patients   Critical care was necessary to treat or prevent imminent or life-threatening deterioration of the following conditions:  Sepsis, respiratory failure and CNS failure or compromise   Critical care was time spent personally by me on the following activities:  Development of treatment plan with patient or surrogate, discussions with consultants, evaluation of patient's response to treatment, examination of patient, ordering and review of laboratory studies, ordering and review of radiographic studies, ordering and performing treatments and interventions, pulse oximetry, re-evaluation of patient's condition and review of old charts     Medications Ordered in ED Medications  vancomycin (VANCOREADY) IVPB 1500 mg/300 mL (1,500 mg Intravenous New Bag/Given 12/01/22 1309)  vancomycin variable dose per unstable renal function (pharmacist dosing) (has no administration in time range)  ceFEPIme (MAXIPIME) 2 g in sodium chloride 0.9 % 100 mL IVPB (has no administration in time range)  acetaminophen (TYLENOL) suppository 650 mg (650 mg Rectal Given 12/01/22 1214)  ceFEPIme (MAXIPIME) 2 g in sodium chloride 0.9 % 100 mL IVPB (0 g Intravenous Stopped 12/01/22 1327)  metroNIDAZOLE (FLAGYL) IVPB 500 mg (0 mg Intravenous Stopped 12/01/22 1327)  lactated ringers bolus 1,000 mL (0 mLs Intravenous Stopped 12/01/22 1328)  lactated ringers bolus 1,000 mL (0 mLs Intravenous Stopped 12/01/22 1421)    And  lactated  ringers bolus 250 mL (0 mLs Intravenous Stopped 12/01/22 1421)    ED Course/ Medical Decision Making/ A&P                                 Medical Decision Making Amount and/or Complexity of Data Reviewed Independent Historian: spouse External Data Reviewed: notes. Labs: ordered.    Details: Acute kidney injury.  Significantly elevated lactate and leukocytosis Radiology: ordered and independent interpretation performed.    Details: No pneumonia or head bleed ECG/medicine tests: ordered and independent interpretation performed.    Details: Sinus tachycardia  Risk OTC drugs. Prescription drug management. Decision regarding hospitalization.   Patient presents critically ill, likely sepsis.  He is found to be febrile and while he has not been hypotensive he has been tachycardic, altered, and has lactate of 3 that went up to 5.  Found to have acute kidney injury with essentially no urine output through his suprapubic catheter.  I did a quick bedside ultrasound that showed a very large bladder and so after discussion with wife I did change his catheter and he has had over a liter out of urine.  Probably has a UTI contributing to his symptoms.  His nonrebreather has been weaned down to a Ventimask and we will continue to wean oxygen as appropriate.  Otherwise, his work of breathing has significantly improved, part of this was probably fever.  He was given broad IV antibiotics for sepsis.  He was given 30 cc/KG of IV fluids.  I did have discussions with wife, who indicates that at this point patient is full code.  Chart review shows that frequently the patient does not speak though  she indicates that he does speak at the facility.  Either way he has significant chronic comorbidities and now has septic shock.  We discussed that this can lead to significant mortality.  He is admitted to the hospitalist service, discussed with Dr. Laural Benes.        Final Clinical Impression(s) / ED  Diagnoses Final diagnoses:  Septic shock (HCC)  Acute kidney injury (HCC)  Suprapubic catheter dysfunction, initial encounter (HCC)  Acute hypoxemic respiratory failure (HCC)    Rx / DC Orders ED Discharge Orders     None         Pricilla Loveless, MD 12/01/22 1438

## 2022-12-01 NOTE — Progress Notes (Signed)
Pharmacy Antibiotic Note  Dylan Martin is a 77 y.o. male admitted on 12/01/2022 with  unknown source .  Pharmacy has been consulted for cefepime and Vancomycin dosing. Suprapubic catheter to be changed today. Patient presented in resp distress AKI Scr 5.42, baseline around 1.4  Plan: Vancomycin 1500mg  IV loading dose, then f/u with levels for further dosing or if renal fxn improves Cefepime 2gm IV q24h F/U cxs and clinical progress Monitor V/S, labs and levels as indicated  Height: 5\' 11"  (180.3 cm) Weight: 73.3 kg (161 lb 9.6 oz) IBW/kg (Calculated) : 75.3  Temp (24hrs), Avg:101.1 F (38.4 C), Min:101.1 F (38.4 C), Max:101.1 F (38.4 C)  Recent Labs  Lab 12/01/22 1127 12/01/22 1306  WBC 21.2*  --   CREATININE 5.42*  --   LATICACIDVEN 3.9* 5.2*    Estimated Creatinine Clearance: 11.5 mL/min (A) (by C-G formula based on SCr of 5.42 mg/dL (H)).    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  Elder Cyphers, BS Pharm D, BCPS Clinical Pharmacist 12/01/2022 2:01 PM

## 2022-12-01 NOTE — ED Notes (Signed)
Pts R arm is contracted, arrived with a dressing in place, pt also soiled, brief changed and pero care performed

## 2022-12-02 ENCOUNTER — Encounter (HOSPITAL_COMMUNITY): Payer: Self-pay | Admitting: Family Medicine

## 2022-12-02 ENCOUNTER — Inpatient Hospital Stay (HOSPITAL_COMMUNITY): Payer: Medicare PPO

## 2022-12-02 DIAGNOSIS — R41841 Cognitive communication deficit: Secondary | ICD-10-CM

## 2022-12-02 DIAGNOSIS — Z7189 Other specified counseling: Secondary | ICD-10-CM

## 2022-12-02 DIAGNOSIS — R509 Fever, unspecified: Secondary | ICD-10-CM

## 2022-12-02 DIAGNOSIS — R6521 Severe sepsis with septic shock: Secondary | ICD-10-CM | POA: Diagnosis not present

## 2022-12-02 DIAGNOSIS — I69391 Dysphagia following cerebral infarction: Secondary | ICD-10-CM

## 2022-12-02 DIAGNOSIS — N179 Acute kidney failure, unspecified: Secondary | ICD-10-CM

## 2022-12-02 DIAGNOSIS — G4733 Obstructive sleep apnea (adult) (pediatric): Secondary | ICD-10-CM

## 2022-12-02 DIAGNOSIS — E440 Moderate protein-calorie malnutrition: Secondary | ICD-10-CM | POA: Insufficient documentation

## 2022-12-02 DIAGNOSIS — Z515 Encounter for palliative care: Secondary | ICD-10-CM | POA: Diagnosis not present

## 2022-12-02 LAB — RENAL FUNCTION PANEL
Albumin: 2.4 g/dL — ABNORMAL LOW (ref 3.5–5.0)
Anion gap: 11 (ref 5–15)
BUN: 80 mg/dL — ABNORMAL HIGH (ref 8–23)
CO2: 18 mmol/L — ABNORMAL LOW (ref 22–32)
Calcium: 9 mg/dL (ref 8.9–10.3)
Chloride: 108 mmol/L (ref 98–111)
Creatinine, Ser: 4.51 mg/dL — ABNORMAL HIGH (ref 0.61–1.24)
GFR, Estimated: 13 mL/min — ABNORMAL LOW (ref >60)
Glucose, Bld: 88 mg/dL (ref 70–99)
Phosphorus: 3.1 mg/dL (ref 2.5–4.6)
Potassium: 4 mmol/L (ref 3.5–5.1)
Sodium: 137 mmol/L (ref 135–145)

## 2022-12-02 LAB — CBC WITH DIFFERENTIAL/PLATELET
Abs Immature Granulocytes: 0.03 10*3/uL (ref 0.00–0.07)
Basophils Absolute: 0 10*3/uL (ref 0.0–0.1)
Basophils Relative: 0 %
Eosinophils Absolute: 0.1 10*3/uL (ref 0.0–0.5)
Eosinophils Relative: 1 %
HCT: 33.6 % — ABNORMAL LOW (ref 39.0–52.0)
Hemoglobin: 10.7 g/dL — ABNORMAL LOW (ref 13.0–17.0)
Immature Granulocytes: 0 %
Lymphocytes Relative: 11 %
Lymphs Abs: 1 10*3/uL (ref 0.7–4.0)
MCH: 28.5 pg (ref 26.0–34.0)
MCHC: 31.8 g/dL (ref 30.0–36.0)
MCV: 89.6 fL (ref 80.0–100.0)
Monocytes Absolute: 0.6 10*3/uL (ref 0.1–1.0)
Monocytes Relative: 7 %
Neutro Abs: 6.9 10*3/uL (ref 1.7–7.7)
Neutrophils Relative %: 81 %
Platelets: 198 10*3/uL (ref 150–400)
RBC: 3.75 MIL/uL — ABNORMAL LOW (ref 4.22–5.81)
RDW: 15.1 % (ref 11.5–15.5)
WBC: 8.6 10*3/uL (ref 4.0–10.5)
nRBC: 0 % (ref 0.0–0.2)

## 2022-12-02 LAB — GLUCOSE, CAPILLARY
Glucose-Capillary: 86 mg/dL (ref 70–99)
Glucose-Capillary: 86 mg/dL (ref 70–99)
Glucose-Capillary: 92 mg/dL (ref 70–99)
Glucose-Capillary: 77 mg/dL (ref 70–99)
Glucose-Capillary: 130 mg/dL — ABNORMAL HIGH (ref 70–99)

## 2022-12-02 LAB — MRSA NEXT GEN BY PCR, NASAL: MRSA by PCR Next Gen: DETECTED — AB

## 2022-12-02 LAB — MAGNESIUM: Magnesium: 1.8 mg/dL (ref 1.7–2.4)

## 2022-12-02 MED ORDER — MUPIROCIN 2 % EX OINT
1.0000 | TOPICAL_OINTMENT | Freq: Two times a day (BID) | CUTANEOUS | Status: DC
  Administered 2022-12-02 – 2022-12-06 (×8): 1 via NASAL
  Filled 2022-12-02 (×3): qty 22

## 2022-12-02 MED ORDER — NOREPINEPHRINE 4 MG/250ML-% IV SOLN
2.0000 ug/min | INTRAVENOUS | Status: DC
  Administered 2022-12-02: 2 ug/min via INTRAVENOUS
  Filled 2022-12-02: qty 250

## 2022-12-02 MED ORDER — ORAL CARE MOUTH RINSE
15.0000 mL | OROMUCOSAL | Status: DC
  Administered 2022-12-02 – 2022-12-06 (×17): 15 mL via OROMUCOSAL

## 2022-12-02 MED ORDER — ADULT MULTIVITAMIN W/MINERALS CH
1.0000 | ORAL_TABLET | Freq: Every day | ORAL | Status: DC
  Administered 2022-12-03 – 2022-12-06 (×3): 1 via ORAL
  Filled 2022-12-02 (×4): qty 1

## 2022-12-02 MED ORDER — CHLORHEXIDINE GLUCONATE CLOTH 2 % EX PADS
6.0000 | MEDICATED_PAD | Freq: Every day | CUTANEOUS | Status: DC
  Administered 2022-12-03 – 2022-12-06 (×4): 6 via TOPICAL

## 2022-12-02 MED ORDER — MAGNESIUM SULFATE 2 GM/50ML IV SOLN
2.0000 g | Freq: Once | INTRAVENOUS | Status: AC
  Administered 2022-12-02: 2 g via INTRAVENOUS
  Filled 2022-12-02: qty 50

## 2022-12-02 MED ORDER — SODIUM CHLORIDE 0.9 % IV BOLUS
500.0000 mL | Freq: Once | INTRAVENOUS | Status: AC
  Administered 2022-12-02: 500 mL via INTRAVENOUS

## 2022-12-02 MED ORDER — ENSURE ENLIVE PO LIQD
237.0000 mL | Freq: Two times a day (BID) | ORAL | Status: DC
  Administered 2022-12-03 – 2022-12-06 (×5): 237 mL via ORAL

## 2022-12-02 MED ORDER — LACTATED RINGERS IV SOLN: INTRAVENOUS | Status: AC

## 2022-12-02 MED ORDER — SODIUM CHLORIDE 0.9 % IV SOLN: 250.0000 mL | INTRAVENOUS | Status: AC

## 2022-12-02 MED ORDER — ORAL CARE MOUTH RINSE: 15.0000 mL | OROMUCOSAL | Status: DC | PRN

## 2022-12-02 NOTE — Progress Notes (Signed)
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  His wife reports that she visits him daily at the facility.  She noticed yesterday that he was sleeping most of the day.  She thought it was due to the higher dose of melatonin that had been given to him over the last couple of days to help him sleep better and tolerate his BiPAP better.  When she came to visit him today she found him to look extremely unwell.  He had a fever.  He had respiratory distress.  He was pale and listless and noncommunicative.  EMS was called and he was placed on a nonrebreather and transported to the ED with severe sepsis with septic shock.  They determined that his suprapubic catheter was blocked and it was replaced and subsequently a liter of sediment contaminated fluid was eliminated.  His lactic acid was markedly elevated up to 5.2.  He was started on broad-spectrum antibiotic coverage and sepsis fluid boluses given.  His chest x-ray showed no active disease.  His urinalysis was significant for findings of infection.  CT brain did not show any acute findings.  The patient was  noted to be significantly somnolent.  His blood pressures were notably soft.  Patient is being admitted to the stepdown ICU for treatment of severe sepsis with septic shock.   Assessment and Plan:  Severe Sepsis with septic shock  - secondary to urinary tract infection  - follow up urine culture - still pending - continue broad spectrum antibiotic coverage with IV cefepime dosed by pharm D - de-escalate antibiotics based on urine culture and sensitivity findings - sepsis orderset bundle fluid hydration ordered - trend lactate, down to 2.1 - continue aggressive supportive measures - admit to stepdown ICU - he remains on IV norepinephrine infusion for BP support - hopefully we can wean it down in next 24 hours, if not will work on CTL   Acute respiratory failure with hypoxia  - he is currently on nonrebreather - wean as able  - continue nightly bipap therapy  - ABG was reassuring - no s/s of pneumonia on initial CXR but he is severely dehydrated - recheck pCXR today after he has been hydrated    Lactic Acidosis - severe - peaked at 5.2, now down to 2.1 which is reassuring of positive response to therapy    AKI on CKD stage 3b  - related to bladder outlet obstruction which is now relieved and severe dehydration  - treating with IV fluids  - hold lisinopril  - renally dose meds as able  - avoid nephrotoxins - slight improvement of creatine down to 4.5  - recheck BMP in AM    Acute urinary retention  - relieved after changing out suprapubic catheter in ED by Dr. Criss Alvine  on 11/10 - monitor urine output closely  - urine appears to be flowing freely at this time    Dysphagia/Dysarthria  - these are late effects from his devastating intracerebral hemorrage in 2022 - wife reports that he is on dysphagia 3 diet with nectar thickened liquids - dys 3 diet ordered    Leukocytosis  - secondary to UTI with sepsis - trend CBC with differential with treatment    Hypercalcemia -  Hydrating with IV normal saline first and then plan to recheck CMP - calcium down to 9.0    Paroxysmal atrial fibrillation  - he is no longer anticoagulated due to intracerebral hemorrhage - resumed metoprolol succinate but reduced dose by 50% due to soft BPs - follow on cardiac monitor    OSA - he is cared for by sleep specialist Dr. Vassie Loll - he is currently on nightly bipap but his compliance has been poor per wife - we have reordered nightly bipap for inpatient care   Type 2 diabetes mellitus  - for now we will ordered very sensitive SSI coverage given severe AKI (GFR down to 11) - frequent CBG monitoring in hopes to avoid hypoglycemia    Bilateral renal masses  - pt is followed by Dr. Ellin Saba for annual CT surveillance - wife reports pt had an ablation done on his right kidney years ago   GERD - famotidine ordered for GI protection    Goals of care - given severe co-morbidities and frailty would benefit from goals of care discussion, consulted palliative medicine team   DVT prophylaxis: SCD Code Status: F Family Communication: wife 11/10, 11/11 Disposition: plan is to return to [facility]   Consultants:   Procedures:   Antimicrobials:  Cefepime 11/10>> Vancomycin 11/10>>   Subjective: Pt remains somnolent.   Objective: Vitals:   12/02/22 1300 12/02/22 1400 12/02/22 1500 12/02/22 1600  BP: 132/74 121/64 119/63 126/66  Pulse: 85 86 81 88  Resp: (!) 27 (!) 21 20 (!) 21  Temp:    98.4 F (36.9 C)  TempSrc:    Axillary  SpO2: 100% 100% 100% 100%  Weight:      Height:        Intake/Output Summary (Last 24 hours) at 12/02/2022 1621 Last data filed at 12/02/2022 1618 Gross per 24 hour  Intake 1829.03 ml  Output 1900 ml  Net -70.97 ml   Filed Weights   12/01/22 1329 12/01/22 2145 12/02/22 0221  Weight: 73.3 kg 72.7 kg 72.7 kg   Examination: Constitutional: acutely and chronically ill appearing, unresponsive, seen on bipap. Appears  moderately distressed.   Eyes: PERRL, lids and conjunctivae normal ENMT: Mucous membranes are severely dry. Posterior pharynx with black crusted lesion seen. Partial dentures.  Neck: normal, supple, no masses, no thyromegaly Respiratory: shallow breathing bilateral. No rales heard.   Cardiovascular: normal rate, normal s1, s2 sounds, no murmurs / rubs / gallops. No extremity edema. 2+ pedal pulses. No carotid bruits.  Abdomen: suprapubic catheter in place with cloudy, blood and sediment tinged urine, no tenderness, no masses palpated. No hepatosplenomegaly. Bowel sounds positive.  Musculoskeletal: contractures of bilateral LEs.  Contracture of right arm.  Skin: no rashes, lesions, ulcers. No induration Neurologic: severe contractures of bilateral LEs.   Psychiatric: UTD judgment and insight. Mostly unresponsive except to noxious stimulus.   Data Reviewed: I have personally reviewed following labs and imaging studies  CBC: Recent Labs  Lab 12/01/22 1127 12/02/22 0343  WBC 21.2* 8.6  NEUTROABS 17.8*  6.9  HGB 15.3 10.7*  HCT 46.6 33.6*  MCV 89.1 89.6  PLT 398 198    Basic Metabolic Panel: Recent Labs  Lab 12/01/22 1127 12/02/22 0343  NA 137 137  K 4.8 4.0  CL 100 108  CO2 19* 18*  GLUCOSE 161* 88  BUN 74* 80*  CREATININE 5.42* 4.51*  CALCIUM 10.4* 9.0  MG  --  1.8  PHOS  --  3.1    CBG: Recent Labs  Lab 12/01/22 2132 12/02/22 0218 12/02/22 0813 12/02/22 1118 12/02/22 1605  GLUCAP 91 86 86 92 77    Recent Results (from the past 240 hour(s))  Blood Culture (routine x 2)     Status: None (Preliminary result)   Collection Time: 12/01/22 11:27 AM   Specimen: Right Antecubital; Blood  Result Value Ref Range Status   Specimen Description   Final    RIGHT ANTECUBITAL BOTTLES DRAWN AEROBIC AND ANAEROBIC   Special Requests Blood Culture adequate volume  Final   Culture   Final    NO GROWTH < 24 HOURS Performed at [facility], [address], [place], [place]  [address]    Report Status PENDING  Incomplete  Blood Culture (routine x 2)     Status: None (Preliminary result)   Collection Time: 12/01/22 11:55 AM   Specimen: BLOOD LEFT HAND  Result Value Ref Range Status   Specimen Description BLOOD LEFT HAND AEROBIC BOTTLE ONLY  Final   Special Requests   Final    Blood Culture results may not be optimal due to an inadequate volume of blood received in culture bottles   Culture   Final    NO GROWTH < 24 HOURS Performed at [facility], [address], [place], [place] [address]    Report Status PENDING  Incomplete  Resp panel by RT-PCR (RSV, Flu A&B, Covid) Anterior Nasal Swab     Status: None   Collection Time: 12/01/22  2:13 PM   Specimen: Anterior Nasal Swab  Result Value Ref Range Status   SARS Coronavirus 2 by RT PCR NEGATIVE NEGATIVE Final    Comment: (NOTE) SARS-CoV-2 target nucleic acids are NOT DETECTED.  The SARS-CoV-2 RNA is generally detectable in upper respiratory specimens during the acute phase of infection. The lowest concentration of SARS-CoV-2 viral copies this assay can detect is 138 copies/mL. A negative result does not preclude SARS-Cov-2 infection and should not be used as the sole basis for treatment or other patient management decisions. A negative result may occur with  improper specimen collection/handling, submission of specimen other than nasopharyngeal swab, presence of viral mutation(s) within the areas targeted by this assay, and inadequate number of viral copies(<138 copies/mL). A negative result must be combined with clinical observations, patient history, and epidemiological information. The expected result is Negative.  Fact Sheet for Patients:  BloggerCourse.com  Fact Sheet for Healthcare Providers:  SeriousBroker.it  This test is no t yet approved or cleared by the [place] FDA and  has been authorized for detection and/or diagnosis of SARS-CoV-2  by FDA under an Emergency Use Authorization (EUA). This EUA will remain  in effect (meaning this test can be used) for the duration of the COVID-19 declaration under Section 564(b)(1) of the Act, 21 U.S.C.section 360bbb-3(b)(1), unless the authorization is terminated  or revoked sooner.       Influenza A by PCR NEGATIVE NEGATIVE Final   Influenza B by PCR NEGATIVE NEGATIVE Final    Comment: (NOTE) The Xpert Xpress SARS-CoV-2/FLU/RSV plus  assay is intended as an aid in the diagnosis of influenza from Nasopharyngeal swab specimens and should not be used as a sole basis for treatment. Nasal washings and aspirates are unacceptable for Xpert Xpress SARS-CoV-2/FLU/RSV testing.  Fact Sheet for Patients: BloggerCourse.com  Fact Sheet for Healthcare Providers: SeriousBroker.it  This test is not yet approved or cleared by the [place] FDA and has been authorized for detection and/or diagnosis of SARS-CoV-2 by FDA under an Emergency Use Authorization (EUA). This EUA will remain in effect (meaning this test can be used) for the duration of the COVID-19 declaration under Section 564(b)(1) of the Act, 21 U.S.C. section 360bbb-3(b)(1), unless the authorization is terminated or revoked.     Resp Syncytial Virus by PCR NEGATIVE NEGATIVE Final    Comment: (NOTE) Fact Sheet for Patients: BloggerCourse.com  Fact Sheet for Healthcare Providers: SeriousBroker.it  This test is not yet approved or cleared by the [place] FDA and has been authorized for detection and/or diagnosis of SARS-CoV-2 by FDA under an Emergency Use Authorization (EUA). This EUA will remain in effect (meaning this test can be used) for the duration of the COVID-19 declaration under Section 564(b)(1) of the Act, 21 U.S.C. section 360bbb-3(b)(1), unless the authorization is terminated or revoked.  Performed at  [facility], [address] [place], [place] [address]   MRSA Next Gen by PCR, Nasal     Status: Abnormal   Collection Time: 12/01/22  9:45 PM   Specimen: Nasal Mucosa; Nasal Swab  Result Value Ref Range Status   MRSA by PCR Next Gen DETECTED (A) NOT DETECTED Final    Comment: RESULT CALLED TO, READ BACK BY AND VERIFIED WITH: ALCIA FAWYER @ 1612 ON 12/02/22 C VARNER (NOTE) The GeneXpert MRSA Assay (FDA approved for NASAL specimens only), is one component of a comprehensive MRSA colonization surveillance program. It is not intended to diagnose MRSA infection nor to guide or monitor treatment for MRSA infections. Test performance is not FDA approved in patients less than 40 years old. Performed at [facility], [address], [place], [place] [address]      Radiology Studies: [facility] Chest [facility] 1 View  Result Date: 12/01/2022 CLINICAL DATA:  Fever, hypoxia. EXAM: PORTABLE CHEST 1 VIEW COMPARISON:  Chest CT dated 08/17/2021. FINDINGS: The heart is mildly enlarged. Vascular calcifications are seen in the aortic arch. A left subclavian approach cardiac device is redemonstrated. The lungs are clear. Degenerative changes are seen in the spine. IMPRESSION: No active disease. Cardiomegaly. Electronically Signed   By: Romona Curls M.D.   On: 12/01/2022 13:35   CT Head Wo Contrast  Result Date: 12/01/2022 CLINICAL DATA:  Mental status change, unknown cause EXAM: CT HEAD WITHOUT CONTRAST TECHNIQUE: Contiguous axial images were obtained from the base of the skull through the vertex without intravenous contrast. RADIATION DOSE REDUCTION: This exam was performed according to the departmental dose-optimization program which includes automated exposure control, adjustment of the mA and/or kV according to patient size and/or use of iterative reconstruction technique. COMPARISON:  CT head 05/05/21 FINDINGS: Brain: No hemorrhage. No hydrocephalus. No extra-axial fluid collection. No CT evidence of an acute  cortical infarct. No mass effect. No mass lesion. 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 Skull: Normal. Negative for fracture or focal lesion. Sinuses/Orbits: No middle ear or mastoid effusion. Paranasal sinuses are clear. Left lens replacement. Orbits are otherwise unremarkable. Other: None. IMPRESSION:  1. No hemorrhage or CT evidence of an acute cortical infarct. 2. 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 Electronically Signed   By: Lorenza Cambridge M.D.   On: 12/01/2022 13:04    Scheduled Meds:  Chlorhexidine Gluconate Cloth  6 each Topical Daily   [START ON 12/03/2022] Chlorhexidine Gluconate Cloth  6 each Topical Q0600   famotidine  20 mg Oral Daily   feeding supplement  237 mL Oral BID BM   FLUoxetine  20 mg Oral Daily   insulin aspart  0-6 Units Subcutaneous TID WC   melatonin  6 mg Oral QHS   metoprolol succinate  25 mg Oral BID   multivitamin with minerals  1 tablet Oral Daily   mupirocin ointment  1 Application Nasal BID   Muscle Rub  1 Application Topical BID   mouth rinse  15 mL Mouth Rinse 4 times per day   simvastatin  40 mg Oral q1800   sodium bicarbonate  650 mg Oral BID   vancomycin variable dose per unstable renal function (pharmacist dosing)   Does not apply See admin instructions   Continuous Infusions:  sodium chloride     ceFEPime (MAXIPIME) IV Stopped (12/02/22 1152)   lactated ringers 100 mL/hr at 12/02/22 1618   norepinephrine (LEVOPHED) Adult infusion 2 mcg/min (12/02/22 1618)     LOS: 1 day   Critical Care Procedure Note Authorized and Performed by: Maryln Manuel MD  Total Critical Care time:  53 mins Due to a high probability of clinically significant, life threatening deterioration, the patient required my highest level of preparedness to intervene  emergently and I personally spent this critical care time directly and personally managing the patient.  This critical care time included obtaining a history; examining the patient, pulse oximetry; ordering and review of studies; arranging urgent treatment with development of a management plan; evaluation of patient's response of treatment; frequent reassessment; and discussions with other providers.  This critical care time was performed to assess and manage the high probability of imminent and life threatening deterioration that could result in multi-organ failure.  It was exclusive of separately billable procedures and treating other patients and teaching time.    Standley Dakins, MD How to contact the [facility] Attending or Consulting provider 7A - 7P or covering provider during after hours 7P -7A, for this patient?  Check the care team in [facility] and look for a) attending/consulting TRH provider listed and b) the [facility] team listed Log into [email or web address] to find provider on call.  Locate the [facility] provider you are looking for under [facility] and page to a number that you can be directly reached. If you still have difficulty reaching the provider, please page the [facility] (Director on Call) for the Hospitalists listed on amion for assistance.  12/02/2022, 4:21 PM

## 2022-12-02 NOTE — Progress Notes (Signed)
SLP Cancellation Note  Patient Details Name: Dylan Martin MRN: [id number] DOB: Oct 17, 1945   Cancelled treatment:       Reason Eval/Treat Not Completed: Fatigue/lethargy limiting ability to participate (SLE ordered, head CT is negative for acute changes, Pt lethargic today. SLP will check back tomorrow for SLE if indicated. Pt with aphasia from [facility] 2022 and on D3 and NTL per wife.)  Thank you,  [name], CCC-SLP [phone]  [name] 12/02/2022, 3:31 PM

## 2022-12-02 NOTE — TOC Initial Note (Signed)
Transition of Care [facility]) - Initial/Assessment Note    Patient Details  Name: Dylan Martin MRN: [id number] Date of Birth: [birth year]  Transition of Care [facility] CM/SW Contact:    Villa Herb, LCSWA Phone Number: 12/02/2022, 2:51 PM  Clinical Narrative:                 Pt arrived to hospital from [facility]. CSW spoke with pts wife who states she likes the therapist that is working with pt at this time. She is hesitant for return to [facility] but feels that the therapist is the only one really able to help pt so she would like for him to return to [facility] at D/C. TOC to follow.   Expected Discharge Plan: Skilled Nursing Facility Barriers to Discharge: Continued Medical Work up   Patient Goals and CMS Choice Patient states their goals for this hospitalization and ongoing recovery are:: return to SNF CMS Medicare.gov Compare Post Acute Care list provided to:: Patient Represenative (must comment) Choice offered to / list presented to : Spouse [place] ownership interest in [facility].provided to:: Spouse    Expected Discharge Plan and Services In-house Referral: Clinical Social Work Discharge Planning Services: CM Consult Post Acute Care Choice: Skilled Nursing Facility Living arrangements for the past 2 months: Skilled Nursing Facility                                      Prior Living Arrangements/Services Living arrangements for the past 2 months: Skilled Nursing Facility Lives with:: Facility Resident Patient language and need for interpreter reviewed:: Yes Do you feel safe going back to the place where you live?: Yes      Need for Family Participation in Patient Care: Yes (Comment) Care giver support system in place?: Yes (comment)   Criminal Activity/Legal Involvement Pertinent to Current Situation/Hospitalization: No - Comment as needed  Activities of Daily Living   ADL Screening (condition at time of admission) Independently performs ADLs?: No Does the  patient have a NEW difficulty with bathing/dressing/toileting/self-feeding that is expected to last >3 days?: Yes (Initiates electronic notice to provider for possible OT consult) Does the patient have a NEW difficulty with getting in/out of bed, walking, or climbing stairs that is expected to last >3 days?: Yes (Initiates electronic notice to provider for possible PT consult) Does the patient have a NEW difficulty with communication that is expected to last >3 days?: Yes (Initiates electronic notice to provider for possible SLP consult) Is the patient deaf or have difficulty hearing?: No Does the patient have difficulty seeing, even when wearing glasses/contacts?: No Does the patient have difficulty concentrating, remembering, or making decisions?: Yes  Permission Sought/Granted                  Emotional Assessment         Alcohol / Substance Use: Not Applicable Psych Involvement: No (comment)  Admission diagnosis:  Acute kidney injury (HCC) [N17.9] Suprapubic catheter dysfunction, initial encounter (HCC) [T83.010A] Septic shock (HCC) [A41.9, R65.21] Acute hypoxemic respiratory failure (HCC) [J96.01] Severe sepsis with septic shock (CODE) (HCC) [R65.21] Patient Active Problem List   Diagnosis Date Noted   Severe sepsis with septic shock (CODE) (HCC) 12/01/2022   Lactic acidosis 12/01/2022   AKI (acute kidney injury) (HCC) 12/01/2022   Hypercalcemia 12/01/2022   Leukocytosis 12/01/2022   Metabolic acidosis 12/01/2022   Acute respiratory failure with hypoxia (HCC) 12/01/2022  Fever, unspecified 12/01/2022   Acute urinary retention blockage of suprapubic catheter 12/01/2022   OSA (obstructive sleep apnea) 08/06/2021   Presence of cardiac pacemaker 07/11/2021   Cognitive communication deficit 07/11/2021   Chronic kidney disease, stage 3 unspecified (HCC) 07/11/2021   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 07/11/2021   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   Hemiplegia (HCC) 07/11/2021    Hyperlipidemia 07/11/2021   Muscle weakness (generalized) 07/11/2021   Contracture, right knee 07/11/2021   Contracture, left knee 07/11/2021   Type 2 diabetes mellitus without complications (HCC) 07/11/2021   Paroxysmal atrial fibrillation (HCC) 07/11/2021   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HCC) 07/11/2021   Abnormal posture 07/11/2021   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) 07/11/2021   Unspecified atrial fibrillation (HCC) 07/11/2021   Bilateral renal masses 06/29/2021   Counseling, unspecified 06/07/2021   Esophageal reflux 06/07/2021   Essential hypertension 06/07/2021   Gout 06/07/2021   Late effects of cerebrovascular disease 06/07/2021   Neurogenic bladder 05/21/2021   Encounter for care or replacement of suprapubic tube (HCC) 05/21/2021   History of intracranial hemorrhage 11/21/2020   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 [place] [address] Phone: [phone] Fax: [phone]  [facility] [phone] - [place], [place] - [address] AT [facility] [address] [place] [address] Phone: [phone] Fax: [phone]     Social Determinants of Health (SDOH) Social History: SDOH Screenings   Food Insecurity: Patient Unable To Answer (12/01/2022)  Housing: Patient Declined (12/01/2022)  Transportation Needs: No Transportation Needs (12/01/2022)  Utilities: Not At Risk (12/01/2022)  Depression (PHQ2-9): Low Risk  (07/19/2021)  Social Connections:  Unknown (03/19/2022)   Received from [facility], [facility]  Tobacco Use: Low Risk  (12/02/2022)   SDOH Interventions:     Readmission Risk Interventions    12/02/2022    2:50 PM  Readmission Risk Prevention Plan  Transportation Screening Complete  Home Care Screening Complete  Medication Review (RN CM) Complete

## 2022-12-02 NOTE — Consult Note (Signed)
Consultation Note Date: 12/02/2022   Patient Name: Dylan Martin  DOB: [birth year]  MRN: [id number]  Age / Sex: 77 y.o., male  PCP: Gilmore Laroche, FNP Referring Physician: Cleora Fleet, MD  Reason for Consultation: Establishing goals of care  HPI/Patient Profile: 77 y.o. 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   Clinical Assessment and Goals of Care: I have reviewed medical records including EPIC notes, labs and imaging, received report from RN, assessed the patient.  [name] is lying quietly in bed.  He appears acutely/chronically ill and very frail.  His eyes are open with his son, [name], at bedside.  Mr. Ham does not make eye contact and he does not attempt to answer my questions.  He clearly cannot make his needs known. [name] states that [name]'s wife of 10 to 12 years, [name], is [name] is stepmother.  He states that Mr. Liebert has been a resident of [facility] for approximately 1 year.  [name] tells of a 60 pound weight loss over 2 years stating that his father has been "going downhill" since his stroke approximately January 2022.  [name] shares that his stepmother [name] "thinks he will walk".  Call to wife, [name], left voicemail message.  Return phone call from [name] less than 1 hour later.  We meet to discuss diagnosis prognosis, GOC, EOL wishes, disposition and options.  I  introduced Palliative Medicine as specialized medical care for people living with serious illness. It focuses on providing relief from the symptoms and stress of a serious illness. The goal is to improve quality of life for both the patient and the family.  We then focused on their current illness.  [name] shares that her husband has been at [facility] for a little over 1 year.  She shares that she feels he is getting good therapy and the current therapist at [facility] is helping with his lower extremity contractures.  We talked about his sepsis and the treatment plan including, but not limited to, IV fluids, IV antibiotics, other medications.  We also review selected labs.  We talked about time for outcomes.  The natural disease trajectory and expectations at EOL were discussed.  Advanced directives, concepts specific to code status, artifical feeding and hydration, and rehospitalization were considered and discussed.  [name] states that she does not have a DNR for her husband but she is considering options.  We talked about the concept of "treat the treatable, but allow a natural passing".  Discussed the importance of continued conversation with family and the medical providers regarding overall plan of care and treatment  options, ensuring decisions are within the context of the patient's values and GOCs. Questions and concerns were addressed.  The family was encouraged to call with questions or concerns.  PMT will continue to support holistically.  Conference with attending, bedside nursing staff, transition of care team related to patient condition, needs, goals of care, disposition.    HCPOA  NEXT OF KIN - wife, [name].     SUMMARY OF RECOMMENDATIONS   At this point full scope/full code Time for outcomes Anticipate return to long-term care at [facility]   Code Status/Advance Care Planning: Full code -we discussed the concept of "treat the treatable, but allow  a natural passing".  Mrs. Yuhasz shares she is considering CODE STATUS.  Symptom Management:  Per hospitalist, no additional needs at this time.  Palliative Prophylaxis:  Frequent Pain Assessment, Oral Care, and Turn Reposition  Additional Recommendations (Limitations, Scope, Preferences): Full Scope Treatment  Psycho-social/Spiritual:  Desire for further Chaplaincy support:no Additional Recommendations: Caregiving  Support/Resources and ICU Family Guide  Prognosis:  Unable to determine, based on outcomes.  Guarded at this point.  Discharge Planning: Anticipate returning to long-term care at [facility] where he has been for almost 1 year.      Primary Diagnoses: Present on Admission:  Severe sepsis with septic shock (CODE) (HCC)  Neurogenic bladder  Esophageal reflux  Essential hypertension  Gout  Chronic kidney disease, stage 3 unspecified (HCC)  Renovascular hypertension  Hyperlipidemia  Contracture, right knee  Contracture, left knee  Paroxysmal atrial fibrillation (HCC)  Muscle weakness (generalized)  OSA (obstructive sleep apnea)  Unspecified atrial fibrillation (HCC)  Abnormal posture  Presence of cardiac pacemaker  Lactic acidosis  AKI (acute kidney injury) (HCC)  Hypercalcemia  Leukocytosis  Metabolic acidosis  Acute respiratory failure with hypoxia (HCC)  Fever, unspecified  Bilateral renal masses  Acute urinary retention blockage of suprapubic catheter   I have reviewed the medical record, interviewed the patient and family, and examined the patient. The following aspects are pertinent.  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HCC)    Aphasia following cerebral infarction    Atherosclerotic heart disease of native coronary artery without angina pectoris    Benign prostatic hyperplasia with lower urinary tract symptoms    Benign prostatic hyperplasia without lower urinary tract symptoms    Bradycardia,  unspecified    Cerebral infarction (HCC)    Chronic kidney disease, stage 3 unspecified (HCC)    Cognitive communication deficit    Dysarthria    Dysphagia    Encephalopathy    Essential (primary) hypertension    Gastro-esophageal reflux disease without esophagitis    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HCC)    Hemiplegia (HCC)    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)    Hyperlipidemia    Hyperosmolality and hypernatremia    Muscle weakness (generalized)    Myocardial infarct, old    Neuromuscular dysfunction of bladder, unspecified    Nontraumatic intracerebral hemorrhage (HCC)    Nontraumatic intracerebral hemorrhage (HCC)    Obstructive sleep apnea    Paroxysmal atrial fibrillation (HCC)    Presence of cardiac pacemaker    Presence of coronary angioplasty implant and graft    Pressure ulcer    Renovascular hypertension    Retention of urine    Seizures (HCC)    TIA (transient ischemic attack)    Type 2 diabetes mellitus without complication (HCC)    Unspecified atrial fibrillation (HCC)  Unspecified disorder of synovium and tendon, right shoulder    Unspecified lack of coordination    Weakness    Social History   Socioeconomic History   Marital status: Married    Spouse name: Not on file   Number of children: Not on file   Years of education: Not on file   Highest education level: Not on file  Occupational History   Not on file  Tobacco Use   Smoking status: Never   Smokeless tobacco: Never  Vaping Use   Vaping status: Never Used  Substance and Sexual Activity   Alcohol use: Not Currently   Drug use: Never   Sexual activity: Not on file  Other Topics Concern   Not on file  Social History Narrative   Not on file   Social Determinants of Health   Financial Resource Strain: Not on file  Food Insecurity: Patient Unable To Answer (12/01/2022)   Hunger Vital Sign    Worried About Running Out of Food in  the Last Year: Patient unable to answer    Ran Out of Food in the Last Year: Patient unable to answer  Transportation Needs: No Transportation Needs (12/01/2022)   PRAPARE - Administrator, Civil Service (Medical): No    Lack of Transportation (Non-Medical): No  Physical Activity: Not on file  Stress: Not on file  Social Connections: Unknown (03/19/2022)   Received from [facility]   Social Network    Social Network: Not on file   History reviewed. No pertinent family history. Scheduled Meds:  Chlorhexidine Gluconate Cloth  6 each Topical Daily   famotidine  20 mg Oral Daily   feeding supplement  237 mL Oral BID BM   FLUoxetine  20 mg Oral Daily   insulin aspart  0-6 Units Subcutaneous TID WC   melatonin  6 mg Oral QHS   metoprolol succinate  25 mg Oral BID   multivitamin with minerals  1 tablet Oral Daily   Muscle Rub  1 Application Topical BID   mouth rinse  15 mL Mouth Rinse 4 times per day   simvastatin  40 mg Oral q1800   sodium bicarbonate  650 mg Oral BID   vancomycin variable dose per unstable renal function (pharmacist dosing)   Does not apply See admin instructions   Continuous Infusions:  sodium chloride     ceFEPime (MAXIPIME) IV 2 g (12/02/22 1122)   lactated ringers 100 mL/hr at 12/02/22 1011   norepinephrine (LEVOPHED) Adult infusion 3 mcg/min (12/02/22 1011)   PRN Meds:.acetaminophen **OR** acetaminophen, bisacodyl, bisacodyl, fentaNYL (SUBLIMAZE) injection, nitroGLYCERIN, ondansetron **OR** ondansetron (ZOFRAN) IV, mouth rinse, oxyCODONE Medications Prior to Admission:  Prior to Admission medications   Medication Sig Start Date End Date Taking? Authorizing Provider  acetaminophen (TYLENOL) 325 MG tablet Take 650 mg by mouth every 4 (four) hours as needed for mild pain or fever.   Yes [provider]  AMBULATORY NON FORMULARY MEDICATION 1 Device by Does not apply route at bedtime. BiPap   Yes [provider]   amLODipine (NORVASC) 10 MG tablet Take 10 mg by mouth daily. 04/12/21  Yes [provider]  ascorbic acid (VITAMIN C) 500 MG tablet Take 500 mg by mouth daily.   Yes [provider]  Baclofen 5 MG TABS Take 5 mg by mouth 3 (three) times daily.   Yes [provider]  bisacodyl (DULCOLAX) 10 MG suppository Place 10 mg rectally daily as needed (constipation). 11/17/20  Yes [provider]  famotidine (PEPCID) 20 MG tablet Take 20 mg by mouth daily. 08/01/22  Yes [provider]  fexofenadine (ALLEGRA) 180 MG tablet Take 180 mg by mouth daily as needed for allergies or rhinitis.   Yes [provider]  FLUoxetine (PROZAC) 20 MG capsule Take 20 mg by mouth daily. 05/04/21  Yes [provider]  hydrALAZINE (APRESOLINE) 25 MG tablet Take 25 mg by mouth every 8 (eight) hours. 04/12/21  Yes [provider]  lisinopril (ZESTRIL) 40 MG tablet Take 40 mg by mouth daily. 04/12/21  Yes [provider]  loperamide (IMODIUM A-D) 2 MG tablet Take 2-4 mg by mouth See admin instructions. 2 entries on MAR: 1) Take 2 mg every 6 hours as needed for diarrhea, max of 8 mg in 24 hours. 2) Take 4 mg every 12 hours as needed for diarrhea/after 1st loose stool, max of 8 mg in 24 hours.   Yes [provider]  melatonin 5 MG TABS Take 5 mg by mouth at bedtime.   Yes [provider]  Menthol, Topical Analgesic, 4 % GEL Apply 1 application  topically 2 (two) times daily. To both legs   Yes [provider]  metoprolol succinate (TOPROL-XL) 50 MG 24 hr tablet Take 50 mg by mouth 2 (two) times daily. 06/15/21  Yes [provider]  nitroGLYCERIN (NITROSTAT) 0.4 MG SL tablet Place 0.4 mg under the tongue every 5 (five) minutes as needed for chest pain.   Yes [provider]  NON FORMULARY Take 1 Container by mouth 2 (two) times daily. Magic Cups   Yes [provider]  Nutritional Supplement LIQD Take 120 mLs  by mouth 2 (two) times daily. House Supplement   Yes [provider]  ondansetron (ZOFRAN) 4 MG tablet Take 4 mg by mouth every 12 (twelve) hours as needed for vomiting or nausea. 06/28/21  Yes [provider]  simvastatin (ZOCOR) 40 MG tablet Take 40 mg by mouth daily at 6 PM. 06/29/22  Yes [provider]  sodium bicarbonate 650 MG tablet Take 650 mg by mouth daily. 11/17/20  Yes [provider]  thiamine 100 MG tablet Take 100 mg by mouth daily. 11/17/20  Yes [provider]   Allergies  Allergen Reactions   Asa [Aspirin] Other (See Comments)    Hx of aneurysm, told to avoid by MD   Other Other (See Comments)    Blood thinners- wife wants documented that patient should not be on blood thinners due to past medical history (Hx of aneurysm) Red meat - causes gout    Shrimp (Diagnostic) Other (See Comments)    Gout flare   Review of Systems  Unable to perform ROS: Acuity of condition    Physical Exam Vitals and nursing note reviewed.  Constitutional:      General: He is in acute distress.     Appearance: He is ill-appearing.  HENT:     Mouth/Throat:     Mouth: Mucous membranes are dry.  Cardiovascular:     Rate and Rhythm: Normal rate.  Pulmonary:     Comments: No acute respiratory distress but difficulty breathing Skin:    General: Skin is warm and dry.  Neurological:     Comments: Unable to answer orientation questions     Vital Signs: BP 132/74   Pulse 85   Temp 98.8 F (37.1 C) (Axillary)   Resp (!) 27   Ht 5\' 11"  (1.803 m)  Wt 72.7 kg   SpO2 100%   BMI 22.35 kg/m  Pain Scale: CPOT       SpO2: SpO2: 100 % O2 Device:SpO2: 100 % O2 Flow Rate: .O2 Flow Rate (L/min): 10 L/min  IO: Intake/output summary:  Intake/Output Summary (Last 24 hours) at 12/02/2022 1324 Last data filed at 12/02/2022 1018 Gross per 24 hour  Intake 2612.77 ml  Output 3250 ml  Net -637.23 ml    LBM: Last BM Date : 12/02/22 Baseline  Weight: Weight: 73.3 kg Most recent weight: Weight: 72.7 kg     Palliative Assessment/Data:     Time In: 1030 Time Out: 1145 Time Total: 75 minutes  Greater than 50%  of this time was spent counseling and coordinating care related to the above assessment and plan.  Signed by: Katheran Awe, NP   Please contact Palliative Medicine Team phone at [phone] for questions and concerns.  For individual provider: See Loretha Stapler

## 2022-12-02 NOTE — Progress Notes (Signed)
Initial Nutrition Assessment  DOCUMENTATION CODES:   Non-severe (moderate) malnutrition in context of chronic illness  INTERVENTION:   Magic cup TID with meals, each supplement provides 290 kcal and 9 grams of protein. MVI with minerals daily.  NUTRITION DIAGNOSIS:   Moderate Malnutrition related to chronic illness (ICH, dysphagia, CKD) as evidenced by mild fat depletion, moderate fat depletion, mild muscle depletion, moderate muscle depletion.  GOAL:   Patient will meet greater than or equal to 90% of their needs  MONITOR:   PO intake, Supplement acceptance  REASON FOR ASSESSMENT:   Malnutrition Screening Tool    ASSESSMENT:   77 yo male admitted with severe sepsis, septic shock. PMH includes devastating ICH Jan 2022, dysphagia, aphasia, chronic contractures BLE, cognitive communication deficit, neurogenic bladder with suprapubic catheter, HTN, GERD, CKD stage 3, DM, BPH, seizures, HLD.  Spoke with patient's son at bedside who reports patient usually eats well at nursing home when he visits on Sundays. He is on a soft diet with thickened liquids. He is able to feed himself. He likes Ensure supplements and receives them at the nursing home. He has not walked in quite some time. Legs are now contracted.    Patient has not had his PO medications this morning because he is lethargic and unable to safely swallow. Ensure was ordered this morning, but has been unable to take that as well.   Labs reviewed. BUN 80, creat 4.51, Hgb 10.7 CBG: 91-86-86  Medications reviewed and include pepcid, novolog, sodium bicarb tablet, mag sulfate, levophed. IVF: LR at 100 ml/h  Weight history reviewed. No significant weight changes noted over the past year. Severe depletion of muscle mass in BLE is related to inactivity. Patient meets criteria for moderate malnutrition with mild-moderate depletion of subcutaneous fat mass and mild-moderate depletion of muscle mass in upper body.   NUTRITION -  FOCUSED PHYSICAL EXAM:  Flowsheet Row Most Recent Value  Orbital Region Moderate depletion  Upper Arm Region Moderate depletion  Thoracic and Lumbar Region Mild depletion  Buccal Region Moderate depletion  Temple Region Mild depletion  Clavicle Bone Region Moderate depletion  Clavicle and Acromion Bone Region Mild depletion  Scapular Bone Region Moderate depletion  Dorsal Hand Mild depletion  Patellar Region Severe depletion  Anterior Thigh Region Severe depletion  Posterior Calf Region Severe depletion  Edema (RD Assessment) Mild  [RUE]  Hair Reviewed  Eyes Unable to assess  Mouth Unable to assess  Skin Reviewed  Nails Reviewed       Diet Order:   Diet Order             DIET DYS 3 Room service appropriate? Yes with Assist; Fluid consistency: Nectar Thick  Diet effective now                   EDUCATION NEEDS:   No education needs have been identified at this time  Skin:  Skin Assessment: Skin Integrity Issues: Skin Integrity Issues:: Stage II Stage II: sacrum, L heel  Last BM:  11/11 type 6  Height:   Ht Readings from Last 1 Encounters:  12/02/22 5\' 11"  (1.803 m)    Weight:   Wt Readings from Last 1 Encounters:  12/02/22 72.7 kg    Ideal Body Weight:  78.2 kg  BMI:  Body mass index is 22.35 kg/m.  Estimated Nutritional Needs:   Kcal:  2000-2200  Protein:  100-115 gm  Fluid:  2-2.2 L   Gabriel Rainwater RD, LDN, CNSC Please refer to  Amion for contact information.

## 2022-12-02 NOTE — Evaluation (Signed)
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  His wife reports that she visits him daily at the facility.  She noticed yesterday that he was sleeping most of the day.  She thought it was due to the higher dose of melatonin that had been given to him over the last couple of days to help him sleep better and tolerate his BiPAP better.  When she came to visit him today she found him to look extremely unwell.  He had a fever.  He had respiratory distress.  He was pale and listless and noncommunicative.  EMS was called and he was placed on a nonrebreather and transported to the ED with severe sepsis with septic shock.  They determined that his suprapubic catheter was blocked and it was replaced and subsequently a liter of sediment contaminated fluid was eliminated.  His lactic acid was markedly elevated up to 5.2.  He was started on broad-spectrum antibiotic coverage and sepsis fluid boluses given.  His chest x-ray showed no active disease.  His urinalysis was significant for findings of infection.  CT brain did not show any acute findings.  The patient was noted to be significantly somnolent.  His blood pressures  were notably soft.  Patient is being admitted to the stepdown ICU for treatment of severe sepsis with septic shock.   Clinical Impression  Patient presents severe bilateral knee flexor contractures and unable to fully extend knees with poor tolerance for stretching due to painful grimacing.  Patient required Max assist for sitting up at bedside, had poor sitting balance dur to frequent falling backwards, unsafe to attempt transfers and put back to bed with Max assist to reposition.  Patient will benefit from continued skilled physical therapy in hospital and recommended venue below to increase strength, balance, endurance for safe ADLs and gait.         If plan is discharge home, recommend the following: A lot of help with bathing/dressing/bathroom;A lot of help with walking and/or transfers;Help with stairs or ramp for entrance;Assistance with cooking/housework   Can travel by private vehicle   No    Equipment Recommendations None recommended by PT  Recommendations for Other Services       Functional Status Assessment Patient has had a recent decline in their functional status and/or demonstrates limited ability to make significant improvements in function in a reasonable and predictable amount of time     Precautions / Restrictions Precautions Precautions: Fall Restrictions Weight Bearing Restrictions: No      Mobility  Bed Mobility Overal bed mobility: Needs Assistance Bed Mobility: Supine to Sit, Sit to Supine     Supine to sit: Max assist Sit to supine: Max assist   General bed mobility comments: unable to maintain sitting balance with frequent falling backwards    Transfers  Ambulation/Gait                  Stairs            Wheelchair Mobility     Tilt Bed    Modified Rankin (Stroke Patients Only)       Balance Overall balance assessment: Needs assistance Sitting-balance support: Feet unsupported, No upper  extremity supported Sitting balance-Leahy Scale: Poor Sitting balance - Comments: seated at EOB                                     Pertinent Vitals/Pain Pain Assessment Pain Assessment: Faces Faces Pain Scale: Hurts little more Pain Location: bilateral knees with end range stretching Pain Descriptors / Indicators: Grimacing, Guarding, Discomfort Pain Intervention(s): Limited activity within patient's tolerance, Monitored during session, Repositioned    Home Living Family/patient expects to be discharged to:: Skilled nursing facility                        Prior Function Prior Level of Function : Needs assist       Physical Assist : Mobility (physical);ADLs (physical) Mobility (physical): Bed mobility;Transfers;Gait;Stairs   Mobility Comments: Assisted to standing using standing frame, non-ambulatory, uses wheelchair for mobility ADLs Comments: Assisted by SNF staff     Extremity/Trunk Assessment   Upper Extremity Assessment Upper Extremity Assessment: Defer to OT evaluation    Lower Extremity Assessment Lower Extremity Assessment: Generalized weakness;RLE deficits/detail;LLE deficits/detail RLE Deficits / Details: grossly 2/5, presents with severe knee flexor contractures RLE: Unable to fully assess due to immobilization;Unable to fully assess due to pain RLE Sensation: decreased light touch RLE Coordination: decreased fine motor;decreased gross motor LLE Deficits / Details: grossly 2/5, presents with severe knee flexor contractures LLE: Unable to fully assess due to pain;Unable to fully assess due to immobilization LLE Sensation: decreased light touch;decreased proprioception LLE Coordination: decreased fine motor;decreased gross motor    Cervical / Trunk Assessment Cervical / Trunk Assessment: Kyphotic  Communication   Communication Communication: Difficulty following commands/understanding;Difficulty communicating thoughts/reduced clarity  of speech Following commands: Follows one step commands consistently Cueing Techniques: Verbal cues;Tactile cues  Cognition Arousal: Alert Behavior During Therapy: Flat affect Overall Cognitive Status: Impaired/Different from baseline                                 General Comments: Patient mostly non-verbal, requires Max tactile/verbal cueing for participation        General Comments      Exercises     Assessment/Plan    PT Assessment Patient needs continued PT services  PT Problem List Decreased strength;Decreased activity tolerance;Decreased balance;Decreased mobility;Decreased range of motion;Pain       PT Treatment Interventions DME instruction;Functional mobility training;Therapeutic activities;Therapeutic exercise;Balance training;Patient/family education    PT Goals (Current goals can be found in the Care Plan section)  Acute Rehab PT Goals Patient Stated Goal: not stated PT Goal Formulation: With patient/family Time For Goal Achievement: 12/16/22 Potential to Achieve Goals: Good    Frequency Min 3X/week     Co-evaluation               AM-PAC PT "6 Clicks" Mobility  Outcome Measure Help needed turning from your back to your side while in a flat bed without using bedrails?: A Lot Help needed moving from lying on your  back to sitting on the side of a flat bed without using bedrails?: A Lot Help needed moving to and from a bed to a chair (including a wheelchair)?: Total Help needed standing up from a chair using your arms (e.g., wheelchair or bedside chair)?: Total Help needed to walk in hospital room?: Total Help needed climbing 3-5 steps with a railing? : Total 6 Click Score: 8    End of Session Equipment Utilized During Treatment: Oxygen Activity Tolerance: Patient tolerated treatment well;Patient limited by fatigue Patient left: in bed;with call bell/phone within reach;with family/visitor present Nurse Communication: Mobility  status PT Visit Diagnosis: Unsteadiness on feet (R26.81);Other abnormalities of gait and mobility (R26.89);Muscle weakness (generalized) (M62.81)    Time: 2956-2130 PT Time Calculation (min) (ACUTE ONLY): 21 min   Charges:   PT Evaluation $PT Eval Moderate Complexity: 1 Mod PT Treatments $Therapeutic Activity: 8-22 mins PT General Charges $$ ACUTE PT VISIT: 1 Visit         1:59 PM, 12/02/22 Ocie Bob, MPT Physical Therapist with [facility] 336 [phone] office [phone] mobile phone

## 2022-12-02 NOTE — Plan of Care (Signed)
  Problem: Acute Rehab PT Goals(only PT should resolve) Goal: Pt will Roll Supine to Side Outcome: Progressing Flowsheets (Taken 12/02/2022 1402) Pt will Roll Supine to Side: with mod assist Goal: Pt Will Go Supine/Side To Sit Outcome: Progressing Flowsheets (Taken 12/02/2022 1402) Pt will go Supine/Side to Sit:  with moderate assist  with maximum assist Goal: Pt Will Go Sit To Supine/Side Outcome: Progressing Flowsheets (Taken 12/02/2022 1402) Pt will go Sit to Supine/Side:  with moderate assist  with maximum assist Goal: Patient Will Perform Sitting Balance Outcome: Progressing Flowsheets (Taken 12/02/2022 1402) Patient will perform sitting balance:  with minimal assist  with moderate assist Goal: Pt Will Perform Standing Balance Or Pre-Gait Outcome: Progressing Flowsheets (Taken 12/02/2022 1402) Pt will perform standing balance or pre-gait: with maximum assist   2:03 PM, 12/02/22 Ocie Bob, MPT Physical Therapist with [facility] 336 [phone] office [phone] mobile phone

## 2022-12-03 DIAGNOSIS — G4733 Obstructive sleep apnea (adult) (pediatric): Secondary | ICD-10-CM

## 2022-12-03 DIAGNOSIS — D72829 Elevated white blood cell count, unspecified: Secondary | ICD-10-CM | POA: Diagnosis not present

## 2022-12-03 DIAGNOSIS — Z7189 Other specified counseling: Secondary | ICD-10-CM | POA: Diagnosis not present

## 2022-12-03 DIAGNOSIS — R6521 Severe sepsis with septic shock: Secondary | ICD-10-CM | POA: Diagnosis not present

## 2022-12-03 DIAGNOSIS — Z515 Encounter for palliative care: Secondary | ICD-10-CM | POA: Diagnosis not present

## 2022-12-03 DIAGNOSIS — N2889 Other specified disorders of kidney and ureter: Secondary | ICD-10-CM

## 2022-12-03 DIAGNOSIS — E872 Acidosis, unspecified: Secondary | ICD-10-CM

## 2022-12-03 LAB — CBC WITH DIFFERENTIAL/PLATELET
Abs Immature Granulocytes: 0.04 10*3/uL (ref 0.00–0.07)
Basophils Absolute: 0 10*3/uL (ref 0.0–0.1)
Basophils Relative: 0 %
Eosinophils Absolute: 0.4 10*3/uL (ref 0.0–0.5)
Eosinophils Relative: 4 %
HCT: 34.3 % — ABNORMAL LOW (ref 39.0–52.0)
Hemoglobin: 10.8 g/dL — ABNORMAL LOW (ref 13.0–17.0)
Immature Granulocytes: 0 %
Lymphocytes Relative: 8 %
Lymphs Abs: 0.8 10*3/uL (ref 0.7–4.0)
MCH: 28.3 pg (ref 26.0–34.0)
MCHC: 31.5 g/dL (ref 30.0–36.0)
MCV: 90 fL (ref 80.0–100.0)
Monocytes Absolute: 0.9 10*3/uL (ref 0.1–1.0)
Monocytes Relative: 9 %
Neutro Abs: 7.4 10*3/uL (ref 1.7–7.7)
Neutrophils Relative %: 79 %
Platelets: 219 10*3/uL (ref 150–400)
RBC: 3.81 MIL/uL — ABNORMAL LOW (ref 4.22–5.81)
RDW: 15 % (ref 11.5–15.5)
WBC: 9.5 10*3/uL (ref 4.0–10.5)
nRBC: 0 % (ref 0.0–0.2)

## 2022-12-03 LAB — URINE CULTURE: Culture: 80000 — AB

## 2022-12-03 LAB — RENAL FUNCTION PANEL
Albumin: 2.4 g/dL — ABNORMAL LOW (ref 3.5–5.0)
Anion gap: 9 (ref 5–15)
BUN: 66 mg/dL — ABNORMAL HIGH (ref 8–23)
CO2: 21 mmol/L — ABNORMAL LOW (ref 22–32)
Calcium: 9.3 mg/dL (ref 8.9–10.3)
Chloride: 113 mmol/L — ABNORMAL HIGH (ref 98–111)
Creatinine, Ser: 3.13 mg/dL — ABNORMAL HIGH (ref 0.61–1.24)
GFR, Estimated: 19 mL/min — ABNORMAL LOW (ref >60)
Glucose, Bld: 104 mg/dL — ABNORMAL HIGH (ref 70–99)
Phosphorus: 2.4 mg/dL — ABNORMAL LOW (ref 2.5–4.6)
Potassium: 3.5 mmol/L (ref 3.5–5.1)
Sodium: 143 mmol/L (ref 135–145)

## 2022-12-03 LAB — GLUCOSE, CAPILLARY
Glucose-Capillary: 95 mg/dL (ref 70–99)
Glucose-Capillary: 105 mg/dL — ABNORMAL HIGH (ref 70–99)
Glucose-Capillary: 181 mg/dL — ABNORMAL HIGH (ref 70–99)
Glucose-Capillary: 91 mg/dL (ref 70–99)
Glucose-Capillary: 189 mg/dL — ABNORMAL HIGH (ref 70–99)

## 2022-12-03 LAB — MAGNESIUM: Magnesium: 2.2 mg/dL (ref 1.7–2.4)

## 2022-12-03 LAB — HEMOGLOBIN A1C
Hgb A1c MFr Bld: 5.5 % (ref 4.8–5.6)
Mean Plasma Glucose: 111 mg/dL

## 2022-12-03 LAB — VANCOMYCIN, RANDOM: Vancomycin Rm: 11 ug/mL

## 2022-12-03 MED ORDER — PROCHLORPERAZINE EDISYLATE 10 MG/2ML IJ SOLN
10.0000 mg | Freq: Once | INTRAMUSCULAR | Status: AC
  Administered 2022-12-03: 10 mg via INTRAVENOUS
  Filled 2022-12-03: qty 2

## 2022-12-03 MED ORDER — LACTATED RINGERS IV SOLN: INTRAVENOUS | Status: AC

## 2022-12-03 MED ORDER — MORPHINE SULFATE (PF) 2 MG/ML IV SOLN
2.0000 mg | Freq: Once | INTRAVENOUS | Status: AC
  Administered 2022-12-03: 2 mg via INTRAVENOUS
  Filled 2022-12-03: qty 1

## 2022-12-03 MED ORDER — ALBUTEROL SULFATE (2.5 MG/3ML) 0.083% IN NEBU
2.5000 mg | INHALATION_SOLUTION | Freq: Once | RESPIRATORY_TRACT | Status: AC
  Administered 2022-12-03: 2.5 mg via RESPIRATORY_TRACT
  Filled 2022-12-03: qty 3

## 2022-12-03 NOTE — Progress Notes (Signed)
RT called to evaluate patient's breathing due to new onset of desaturation and congestion. Patient has rhonchous breath sounds throughout and O2 is 90% on 4 lpm. This RT deep suctioned patient orally and then NTS him to cause a better cough and patient was able to spit up a large amount of thick [place] yogurt looking stuff. MD ordered a one time breathing treatment. Being given now. Patient currently on 6 lpm HFNC.

## 2022-12-03 NOTE — Progress Notes (Signed)
OT Cancellation Note  Patient Details Name: Dylan Martin MRN: [id number] DOB: October 13, 1945   Cancelled Treatment:    Reason Eval/Treat Not Completed: OT screened, no needs identified, will sign off. Pt resides at LTC facility and is total care for ADLs. Pt with severe BLE and RUE contracture, non-ambulatory, uses lift/standing equipment for transfers and standing. No further OT needs at this time.     Ezra Sites, OTR/L  [phone] 12/03/2022, 10:26 AM

## 2022-12-03 NOTE — Progress Notes (Signed)
   12/03/22 1958  Assess: MEWS Score  Temp 98.3 F (36.8 C)  BP 105/69  MAP (mmHg) 78  Pulse Rate 100  Resp (!) 24  SpO2 92 %  O2 Device Nasal Cannula  O2 Flow Rate (L/min) 4 L/min  Assess: MEWS Score  MEWS Temp 0  MEWS Systolic 0  MEWS Pulse 0  MEWS RR 1  MEWS LOC 0  MEWS Score 1  MEWS Score Color Green  Assess: SIRS CRITERIA  SIRS Temperature  0  SIRS Pulse 1  SIRS Respirations  1  SIRS WBC 0  SIRS Score Sum  2   Upon entering room patients wife at bedside feeding him, patient presented to be choking and spitting up thick [place] mucus, vital signs obtained and patient placed on 4L per nasal cannula. Dr. Carren Rang  notified of patient status change per wife and choking incident. Patient repositioned in bed and new orders placed per Dr. Herma Carson, see new orders. Respiratory therapy to bedside.   Patient now resting in bed at this time, bed in lowest position, head elevated, and call bell within reach.

## 2022-12-03 NOTE — Plan of Care (Signed)
  Problem: Nutritional: Goal: Progress toward achieving an optimal weight will improve Outcome: Progressing

## 2022-12-03 NOTE — Progress Notes (Signed)
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  His wife reports that she visits him daily at the facility.  She noticed yesterday that he was sleeping most of the day.  She thought it was due to the higher dose of melatonin that had been given to him over the last couple of days to help him sleep better and tolerate his BiPAP better.  When she came to visit him today she found him to look extremely unwell.  He had a fever.  He had respiratory distress.  He was pale and listless and noncommunicative.  EMS was called and he was placed on a nonrebreather and transported to the ED with severe sepsis with septic shock.  They determined that his suprapubic catheter was blocked and it was replaced and subsequently a liter of sediment contaminated fluid was eliminated.  His lactic acid was markedly elevated up to 5.2.  He was started on broad-spectrum antibiotic coverage and sepsis fluid boluses given.  His chest x-ray showed no active disease.  His urinalysis was significant for findings of infection.  CT brain did not show any acute findings.  The patient was  noted to be significantly somnolent.  His blood pressures were notably soft.  Patient is being admitted to the stepdown ICU for treatment of severe sepsis with septic shock.   Assessment and Plan:  Severe Sepsis with septic shock  - secondary to urinary tract infection  - follow up urine culture - gram negative rods seen in preliminary report  - continue broad spectrum antibiotic coverage with IV cefepime dosed by pharm D - de-escalate antibiotics based on urine culture and sensitivity findings - sepsis orderset bundle fluid hydration ordered - trend lactate, down to 2.1 - continue aggressive supportive measures - admit to stepdown ICU - he is now OFF IV norepinephrine infusion for BP support - sepsis physiology seems to be resolving   Acute respiratory failure with hypoxia - improving - he initially required a nonrebreather - wean down to nasal cannula  - continue nightly bipap therapy which is supposed to do at longterm care but not fully compliant per wife - no s/s of pneumonia on initial CXR but he is severely dehydrated - rechecked pCXR on 11/11 after he was hydrated : no acute findings   Lactic Acidosis - severe - peaked at 5.2, now down to 2.1 which is reassuring of positive response to therapy    AKI on CKD stage 3b  - related to bladder outlet obstruction which is now relieved and severe dehydration  - treating with IV fluids  - hold lisinopril  - renally dose meds as able  - avoid nephrotoxins - slight improvement of creatine down to 3.13  - recheck BMP in AM    Acute urinary retention  -  relieved after changing out suprapubic catheter in ED by Dr. Criss Alvine on 11/10 - monitor urine output closely  - urine appears to be flowing freely at this time - follow up with Dr. Retta Diones - his urologist     Dysphagia/Dysarthria - chronic  - these are late effects from his devastating intracerebral hemorrage in 2022 - wife reports that he is on dysphagia 3 diet with nectar  thickened liquids - dys 3 diet ordered nectar thickened liquids   Leukocytosis - RESOLVED  - secondary to UTI with sepsis - WBC trended down to 9.5 with aggressive treatment    Hypercalcemia - Treated and RESOLVED  - Hydrated with IV normal saline as first line therapy - calcium down to 9.0    Paroxysmal atrial fibrillation  - he is no longer anticoagulated due to intracerebral hemorrhage - resumed metoprolol succinate but reduced dose by 50% due to soft BPs - follow on cardiac monitor    OSA - he is cared for by sleep specialist Dr. Vassie Loll - he is currently on nightly bipap but his compliance has been poor per wife - we have reordered nightly bipap for inpatient care   Type 2 diabetes mellitus  - for now we will ordered very sensitive SSI coverage given severe AKI (GFR down to 11) - frequent CBG monitoring in hopes to avoid hypoglycemia  CBG (last 3)  Recent Labs    12/02/22 2052 12/03/22 0300 12/03/22 0728  GLUCAP 130* 95 105*    Bilateral renal masses  - pt is followed by Dr. Ellin Saba for annual CT surveillance - wife reports pt had an ablation done on his right kidney years ago   GERD - famotidine ordered for GI protection    Goals of care - given severe co-morbidities and frailty would benefit from goals of care discussion, consulted palliative medicine team   DVT prophylaxis: SCD Code Status: F Family Communication: wife 11/10, 11/11, 11/12 Disposition: plan is to return to [facility] term [facility]   Consultants:   Procedures:   Antimicrobials:  Cefepime 11/10>> Vancomycin 11/10>>11/12   Subjective: Pt is awake and has been eating with wife feeding at bedside.     Objective: Vitals:   12/03/22 0400 12/03/22 0500 12/03/22 0600 12/03/22 0729  BP: 115/71 120/68 99/60   Pulse: 91 88 86   Resp: 18 16 18    Temp:  99.5 F (37.5 C)  98.9 F (37.2 C)  TempSrc:  Axillary  Axillary  SpO2: 100% 100% 100%   Weight:  76.6 kg    Height:         Intake/Output Summary (Last 24 hours) at 12/03/2022 0947 Last data filed at 12/02/2022 1932 Gross per 24 hour  Intake 2317.37 ml  Output 500 ml  Net 1817.37 ml   Filed Weights   12/01/22 2145 12/02/22 0221 12/03/22 0500  Weight: 72.7 kg 72.7 kg 76.6 kg   Examination: Constitutional: acutely and chronically ill appearing, awake, sitting up and eating, nonverbal.  Does not appear distressed.   Eyes: PERRL, lids and conjunctivae normal ENMT: Mucous membranes are less dry. Posterior pharynx less dry. Partial dentures.  Neck: normal, supple, no masses, no thyromegaly Respiratory: BBS. No rales heard.   Cardiovascular: normal rate, normal s1, s2 sounds, no murmurs / rubs / gallops. No extremity edema. 2+ pedal pulses. No carotid bruits.  Abdomen: suprapubic catheter in place with clear amber urine seen, mild tenderness, no masses palpated. No hepatosplenomegaly. Bowel sounds positive.  Musculoskeletal: contractures of bilateral LEs.  Contracture of right arm.  Skin: no rashes, lesions, ulcers. No induration Neurologic: severe contractures of bilateral LEs.   Psychiatric: UTD judgment and insight.   Data Reviewed: I have personally reviewed following labs and imaging studies  CBC: Recent Labs  Lab 12/01/22 1127 12/02/22 0343 12/03/22 0459  WBC 21.2* 8.6 9.5  NEUTROABS 17.8* 6.9 7.4  HGB 15.3 10.7* 10.8*  HCT 46.6 33.6* 34.3*  MCV 89.1 89.6 90.0  PLT 398 198 219    Basic Metabolic Panel: Recent Labs  Lab 12/01/22 1127 12/02/22 0343 12/03/22 0459  NA 137 137 143  K 4.8 4.0 3.5  CL 100 108 113*  CO2 19* 18* 21*  GLUCOSE 161* 88 104*  BUN 74* 80* 66*  CREATININE 5.42* 4.51* 3.13*  CALCIUM 10.4* 9.0 9.3  MG  --  1.8 2.2  PHOS  --  3.1 2.4*    CBG: Recent Labs  Lab 12/02/22 1118 12/02/22 1605 12/02/22 2052 12/03/22 0300 12/03/22 0728  GLUCAP 92 77 130* 95 105*    Recent Results (from the past 240 hour(s))  Blood Culture (routine x 2)     Status: None  (Preliminary result)   Collection Time: 12/01/22 11:27 AM   Specimen: Right Antecubital; Blood  Result Value Ref Range Status   Specimen Description   Final    RIGHT ANTECUBITAL BOTTLES DRAWN AEROBIC AND ANAEROBIC   Special Requests Blood Culture adequate volume  Final   Culture   Final    NO GROWTH < 24 HOURS Performed at [facility], [address] [place], [place] [address]    Report Status PENDING  Incomplete  Blood Culture (routine x 2)     Status: None (Preliminary result)   Collection Time: 12/01/22 11:55 AM   Specimen: BLOOD LEFT HAND  Result Value Ref Range Status   Specimen Description BLOOD LEFT HAND AEROBIC BOTTLE ONLY  Final   Special Requests   Final    Blood Culture results may not be optimal due to an inadequate volume of blood received in culture bottles   Culture   Final    NO GROWTH < 24 HOURS Performed at [facility], [address] [place], [place] [address]    Report Status PENDING  Incomplete  Resp panel by RT-PCR (RSV, Flu A&B, Covid) Anterior Nasal Swab     Status: None   Collection Time: 12/01/22  2:13 PM   Specimen: Anterior Nasal Swab  Result Value Ref Range Status   SARS Coronavirus 2 by RT PCR NEGATIVE NEGATIVE Final    Comment: (NOTE) SARS-CoV-2 target nucleic acids are NOT DETECTED.  The SARS-CoV-2 RNA is generally detectable in upper respiratory specimens during the acute phase of infection. The lowest concentration of SARS-CoV-2 viral copies this assay can detect is 138 copies/mL. A negative result does not preclude SARS-Cov-2 infection and should not be used as the sole basis for treatment or other patient management decisions. A negative result may occur with  improper specimen collection/handling, submission of specimen other than nasopharyngeal swab, presence of viral mutation(s) within the areas targeted by this assay, and inadequate number of viral copies(<138 copies/mL). A negative result must be combined with clinical observations,  patient history, and epidemiological information. The expected result is Negative.  Fact Sheet for Patients:  BloggerCourse.com  Fact Sheet for Healthcare Providers:  SeriousBroker.it  This test is no t yet approved or cleared by the [place] FDA and  has been authorized for detection and/or diagnosis of SARS-CoV-2 by FDA under  an Emergency Use Authorization (EUA). This EUA will remain  in effect (meaning this test can be used) for the duration of the COVID-19 declaration under Section 564(b)(1) of the Act, 21 U.S.C.section 360bbb-3(b)(1), unless the authorization is terminated  or revoked sooner.       Influenza A by PCR NEGATIVE NEGATIVE Final   Influenza B by PCR NEGATIVE NEGATIVE Final    Comment: (NOTE) The Xpert Xpress SARS-CoV-2/FLU/RSV plus assay is intended as an aid in the diagnosis of influenza from Nasopharyngeal swab specimens and should not be used as a sole basis for treatment. Nasal washings and aspirates are unacceptable for Xpert Xpress SARS-CoV-2/FLU/RSV testing.  Fact Sheet for Patients: BloggerCourse.com  Fact Sheet for Healthcare Providers: SeriousBroker.it  This test is not yet approved or cleared by the [place] FDA and has been authorized for detection and/or diagnosis of SARS-CoV-2 by FDA under an Emergency Use Authorization (EUA). This EUA will remain in effect (meaning this test can be used) for the duration of the COVID-19 declaration under Section 564(b)(1) of the Act, 21 U.S.C. section 360bbb-3(b)(1), unless the authorization is terminated or revoked.     Resp Syncytial Virus by PCR NEGATIVE NEGATIVE Final    Comment: (NOTE) Fact Sheet for Patients: BloggerCourse.com  Fact Sheet for Healthcare Providers: SeriousBroker.it  This test is not yet approved or cleared by the [place] FDA and has been authorized for detection and/or diagnosis of SARS-CoV-2 by FDA under an Emergency Use Authorization (EUA). This EUA will remain in effect (meaning this test can be used) for the duration of the COVID-19 declaration under Section 564(b)(1) of the Act, 21 U.S.C. section 360bbb-3(b)(1), unless the authorization is terminated or revoked.  Performed at [facility], [address], [place], [place] [address]   Urine Culture (for pregnant, neutropenic or urologic patients or patients with an indwelling urinary catheter)     Status: Abnormal (Preliminary result)   Collection Time: 12/01/22  2:13 PM   Specimen: Urine, Suprapubic  Result Value Ref Range Status   Specimen Description   Final    URINE, SUPRAPUBIC Performed at [facility], [address], [place], [place] [address]    Special Requests   Final    NONE Performed at [facility], [address] [place], [place] [address]    Culture (A)  Final    80,000 COLONIES/mL GRAM NEGATIVE RODS IDENTIFICATION AND SUSCEPTIBILITIES TO FOLLOW Performed at [facility], [address] [place], [place] [address]    Report Status PENDING  Incomplete  MRSA Next Gen by PCR, Nasal     Status: Abnormal   Collection Time: 12/01/22  9:45 PM   Specimen: Nasal Mucosa; Nasal Swab  Result Value Ref Range Status   MRSA by PCR Next Gen DETECTED (A) NOT DETECTED Final    Comment: RESULT CALLED TO, READ BACK BY AND VERIFIED WITH: ALCIA FAWYER @ 1612 ON 12/02/22 C VARNER (NOTE) The GeneXpert MRSA Assay (FDA approved for NASAL specimens only), is one component of a comprehensive MRSA colonization surveillance program. It is not intended to diagnose MRSA infection nor to guide or monitor treatment for MRSA infections. Test performance is not FDA approved in patients less than 9 years old. Performed at [facility], [address] [place], [place] [address]      Radiology Studies: DG CHEST PORT 1 VIEW  Result Date:  12/02/2022 CLINICAL DATA:  [id number] Severe sepsis (HCC) [id number] EXAM: PORTABLE CHEST 1 VIEW COMPARISON:  Chest x-ray 12/01/2022, pet ct  09/07/21 FINDINGS: Left chest wall dual lead pacemaker. Persistent cardiomegaly. The heart and mediastinal contours are unchanged. No focal consolidation. No pulmonary edema. No pleural effusion. No pneumothorax. No acute osseous abnormality. IMPRESSION: No active disease. Electronically Signed   By: Tish Frederickson M.D.   On: 12/02/2022 21:49   DG Chest Port 1 View  Result Date: 12/01/2022 CLINICAL DATA:  Fever, hypoxia. EXAM: PORTABLE CHEST 1 VIEW COMPARISON:  Chest CT dated 08/17/2021. FINDINGS: The heart is mildly enlarged. Vascular calcifications are seen in the aortic arch. A left subclavian approach cardiac device is redemonstrated. The lungs are clear. Degenerative changes are seen in the spine. IMPRESSION: No active disease. Cardiomegaly. Electronically Signed   By: Romona Curls M.D.   On: 12/01/2022 13:35   CT Head Wo Contrast  Result Date: 12/01/2022 CLINICAL DATA:  Mental status change, unknown cause EXAM: CT HEAD WITHOUT CONTRAST TECHNIQUE: Contiguous axial images were obtained from the base of the skull through the vertex without intravenous contrast. RADIATION DOSE REDUCTION: This exam was performed according to the departmental dose-optimization program which includes automated exposure control, adjustment of the mA and/or kV according to patient size and/or use of iterative reconstruction technique. COMPARISON:  CT head 05/05/21 FINDINGS: Brain: No hemorrhage. No hydrocephalus. No extra-axial fluid collection. No CT evidence of an acute cortical infarct. No mass effect. No mass lesion. 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 Skull: Normal. Negative for fracture or focal lesion. Sinuses/Orbits: No middle ear or mastoid  effusion. Paranasal sinuses are clear. Left lens replacement. Orbits are otherwise unremarkable. Other: None. IMPRESSION: 1. No hemorrhage or CT evidence of an acute cortical infarct. 2. 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 Electronically Signed   By: Lorenza Cambridge M.D.   On: 12/01/2022 13:04    Scheduled Meds:  Chlorhexidine Gluconate Cloth  6 each Topical Daily   Chlorhexidine Gluconate Cloth  6 each Topical Q0600   famotidine  20 mg Oral Daily   feeding supplement  237 mL Oral BID BM   FLUoxetine  20 mg Oral Daily   insulin aspart  0-6 Units Subcutaneous TID WC   melatonin  6 mg Oral QHS   metoprolol succinate  25 mg Oral BID   multivitamin with minerals  1 tablet Oral Daily   mupirocin ointment  1 Application Nasal BID   Muscle Rub  1 Application Topical BID   mouth rinse  15 mL Mouth Rinse 4 times per day   simvastatin  40 mg Oral q1800   sodium bicarbonate  650 mg Oral BID   vancomycin variable dose per unstable renal function (pharmacist dosing)   Does not apply See admin instructions   Continuous Infusions:  ceFEPime (MAXIPIME) IV Stopped (12/02/22 1152)   lactated ringers     norepinephrine (LEVOPHED) Adult infusion Stopped (12/02/22 1638)     LOS: 2 days   Time spent: 54 mins  Ogechi Kuehnel Laural Benes, MD How to contact the [facility] Attending or Consulting provider 7A - 7P or covering provider during after hours 7P -7A, for this patient?  Check the care team in [facility] and look for a) attending/consulting [facility] provider listed and b) the [facility] team listed Log into [email or web address] to find provider on call.  Locate the [facility] provider you are looking for under [facility] and page to a number that you can be directly reached.  If you still have difficulty reaching the provider, please page the [facility] (Director on Call) for the Hospitalists listed on amion for assistance.  12/03/2022, 9:47 AM

## 2022-12-03 NOTE — Progress Notes (Signed)
SLP Cancellation Note  Patient Details Name: Dylan Martin MRN: [id number] DOB: [birth year]   Cancelled treatment:       Reason Eval/Treat Not Completed: Other (comment) (SLE ordered as part of stroke protocol, head CT negative. Pt is followed by SLP at [facility] and can resume SLP services there after d/c back to facilty- no acute changes in communication per wife (chronic from [facility] 2022). Pt is on D3/NTL.)  Thank you,  [name], CCC-SLP [phone]  [name] 12/03/2022, 6:21 PM

## 2022-12-03 NOTE — Progress Notes (Signed)
Palliative: Mr. Escalon is lying quietly in bed.  He appears acutely/chronically ill and quite frail.  His eyes are open today, and he will briefly make but not keep eye contact.  I do not believe that he can make his basic needs known.  His wife, [name], is present at bedside along with bedside nursing staff attending to needs.  Mrs. Gosselin and I talk about her husband's acute and chronic health concerns.  We talk in detail about sepsis and the treatment plan.  We talked about urinalysis and awaiting culture results.  We talk about a few "what if's and maybe's" around culture results, IV antibiotic use, the treatment plan.  We talked about the likelihood that Mr. Firman will continue to have UTIs.  I shared that just like I will get sick again, so we will see, so will Mr. Bentivegna.  We talked about CODE STATUS today.  Mrs. Mcfaul states that at this point she would like full scope/full code but states that she is considering future choices.  At this point he would return to [facility] under long-term care.  Mrs. Pardi states she is pleased with his therapy there.  We talked about the importance of quarterly meetings with staff at [facility].  Face-to-face conference with bedside nursing staff related to patient condition, needs, goals of care.  Plan: Continue full scope/full code.  Return to [facility] where he has been for over 1 year under long-term care.   50 minutes Lillia Carmel, NP Palliative medicine team Team phone [phone]

## 2022-12-03 NOTE — Plan of Care (Signed)
  Problem: Education: Goal: Ability to describe self-care measures that may prevent or decrease complications (Diabetes Survival Skills Education) will improve Outcome: Not Progressing Goal: Individualized Educational Video(s) Outcome: Not Progressing   Problem: Coping: Goal: Ability to adjust to condition or change in health will improve Outcome: Not Progressing

## 2022-12-04 ENCOUNTER — Inpatient Hospital Stay (HOSPITAL_COMMUNITY): Payer: Medicare PPO

## 2022-12-04 ENCOUNTER — Ambulatory Visit: Payer: Medicare PPO

## 2022-12-04 DIAGNOSIS — L8990 Pressure ulcer of unspecified site, unspecified stage: Secondary | ICD-10-CM | POA: Insufficient documentation

## 2022-12-04 DIAGNOSIS — R6521 Severe sepsis with septic shock: Secondary | ICD-10-CM | POA: Diagnosis not present

## 2022-12-04 DIAGNOSIS — N179 Acute kidney failure, unspecified: Secondary | ICD-10-CM | POA: Insufficient documentation

## 2022-12-04 DIAGNOSIS — N1832 Chronic kidney disease, stage 3b: Secondary | ICD-10-CM

## 2022-12-04 DIAGNOSIS — J690 Pneumonitis due to inhalation of food and vomit: Secondary | ICD-10-CM | POA: Diagnosis not present

## 2022-12-04 DIAGNOSIS — A419 Sepsis, unspecified organism: Principal | ICD-10-CM

## 2022-12-04 DIAGNOSIS — Z515 Encounter for palliative care: Secondary | ICD-10-CM | POA: Diagnosis not present

## 2022-12-04 DIAGNOSIS — Z7189 Other specified counseling: Secondary | ICD-10-CM | POA: Diagnosis not present

## 2022-12-04 LAB — GLUCOSE, CAPILLARY
Glucose-Capillary: 103 mg/dL — ABNORMAL HIGH (ref 70–99)
Glucose-Capillary: 116 mg/dL — ABNORMAL HIGH (ref 70–99)
Glucose-Capillary: 110 mg/dL — ABNORMAL HIGH (ref 70–99)
Glucose-Capillary: 109 mg/dL — ABNORMAL HIGH (ref 70–99)
Glucose-Capillary: 151 mg/dL — ABNORMAL HIGH (ref 70–99)

## 2022-12-04 LAB — RENAL FUNCTION PANEL
Albumin: 2.4 g/dL — ABNORMAL LOW (ref 3.5–5.0)
Anion gap: 3 — ABNORMAL LOW (ref 5–15)
BUN: 52 mg/dL — ABNORMAL HIGH (ref 8–23)
CO2: 22 mmol/L (ref 22–32)
Calcium: 8.9 mg/dL (ref 8.9–10.3)
Chloride: 119 mmol/L — ABNORMAL HIGH (ref 98–111)
Creatinine, Ser: 2.44 mg/dL — ABNORMAL HIGH (ref 0.61–1.24)
GFR, Estimated: 26 mL/min — ABNORMAL LOW (ref >60)
Glucose, Bld: 116 mg/dL — ABNORMAL HIGH (ref 70–99)
Phosphorus: 2.7 mg/dL (ref 2.5–4.6)
Potassium: 3.5 mmol/L (ref 3.5–5.1)
Sodium: 144 mmol/L (ref 135–145)

## 2022-12-04 LAB — BRAIN NATRIURETIC PEPTIDE: B Natriuretic Peptide: 185 pg/mL — ABNORMAL HIGH (ref 0.0–100.0)

## 2022-12-04 LAB — MAGNESIUM: Magnesium: 2.1 mg/dL (ref 1.7–2.4)

## 2022-12-04 LAB — PROCALCITONIN: Procalcitonin: 3.81 ng/mL

## 2022-12-04 MED ORDER — POTASSIUM CHLORIDE IN NACL 20-0.45 MEQ/L-% IV SOLN: INTRAVENOUS | Status: DC

## 2022-12-04 NOTE — Evaluation (Signed)
Clinical/Bedside Swallow Evaluation Patient Details  Name: Dylan Martin MRN: [id number] Date of Birth: September 04, 1945  Today's Date: 12/04/2022 Time: SLP Start Time (ACUTE ONLY): 1340 SLP Stop Time (ACUTE ONLY): 1405 SLP Time Calculation (min) (ACUTE ONLY): 25 min  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HCC)    Aphasia following cerebral infarction    Atherosclerotic heart disease of native coronary artery without angina pectoris    Benign prostatic hyperplasia with lower urinary tract symptoms    Benign prostatic hyperplasia without lower urinary tract symptoms    Bradycardia, unspecified    Cerebral infarction (HCC)    Chronic kidney disease, stage 3 unspecified (HCC)    Cognitive communication deficit    Dysarthria    Dysphagia    Encephalopathy    Essential (primary) hypertension    Gastro-esophageal reflux disease without esophagitis    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HCC)    Hemiplegia (HCC)    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)    Hyperlipidemia    Hyperosmolality and hypernatremia    Muscle weakness (generalized)    Myocardial infarct, old    Neuromuscular dysfunction of bladder, unspecified    Nontraumatic intracerebral hemorrhage (HCC)    Nontraumatic intracerebral hemorrhage (HCC)    Obstructive sleep apnea    Paroxysmal atrial fibrillation (HCC)    Presence of cardiac pacemaker    Presence of coronary angioplasty implant and graft    Pressure ulcer    Renovascular hypertension    Retention of urine    Seizures (HCC)    TIA (transient ischemic attack)    Type 2 diabetes mellitus without complication (HCC)    Unspecified atrial fibrillation (HCC)    Unspecified disorder of synovium and tendon, right shoulder    Unspecified lack of coordination    Weakness    Past Surgical History:  Past Surgical History:  Procedure Laterality Date   FEMUR  SURGERY Left    HIP SURGERY Left    IR GASTROSTOMY TUBE REMOVAL  02/26/2022   SPINAL CORD STIMULATOR IMPLANT     per wife nerve stimulator in back   HPI:  77 year old gentleman with a complex past medical history 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  He has dysphagia, chronic contractures in the lower extremities, history of bedsore, cognitive communication deficit, chronic neurogenic bladder with indwelling suprapubic catheter, hypertension, GERD, history of A-fib, history of second-degree AV block status post pacemaker in 2018, stage III CKD, right hemiplegia, generalized muscle weakness, history of gastrostomy subsequently removed, abnormal posture, bedbound status, OSA on nightly BiPAP, long-term care resident in [place].  His wife reports that she visits him daily at the facility.  She noticed yesterday that he was sleeping most of the day.  She thought it was due to the higher dose of melatonin that had been given to him over the last couple of days to help him sleep better and tolerate his BiPAP better.  When she came to visit him today she found him to look extremely unwell.  He had a fever.  He had respiratory distress.  He was pale and listless and noncommunicative.  EMS was called and he was placed on a nonrebreather and transported to the ED with severe sepsis with septic shock.  They determined that his suprapubic catheter was blocked and it was replaced and subsequently a liter of sediment contaminated fluid was  eliminated.  His lactic acid was markedly elevated up to 5.2.  He was started on broad-spectrum antibiotic coverage and sepsis fluid boluses given.  His chest x-ray showed no active disease.  His urinalysis was significant for findings of infection.  CT brain did not show any acute findings.  The patient was noted to be significantly somnolent.  His blood pressures were notably soft.   Patient is being admitted to the stepdown ICU for treatment of severe sepsis with septic shock. Pt's wife reports most recent MBSS was completed in 2022 with recommendation for D3/NTL.    Assessment / Plan / Recommendation  Clinical Impression  Clinical swallowing evaluation completed while Pt was sitting upright in bed. Pt did not verbally respond to any question; he did smile but provided no meaningful gestures for communication either during my evaluation. Pt's wife at bedside provided hx reporting he normally is verbal and this is a change. She also reports most recent MBSS completed in 2022 with recommendation for D3/NTL and that Pt has been on that diet at [facility] since that time. She further reports treating SLP at [facility] has provided trials of thin liquids during treatment but that he "sometimes aspirates" if he "swallows too fast" and that he hasn't been getting ST for the past month or so. She reports "choking" episode last night occurred when Pt was slumped down in the bed and she gave him some pudding; she reports she "forgot" to get help to sit him up.   Pt demonstrated poor oral awareness with all trials and immediate coughing with 50% of trials of thin liquids. No overt coughing or s/sx of aspiration were observed with trials of puree and NTL. Note oral residue and poor awareness of labial residue with regular tirals. Recommend continue with D2/fine chop diet and NECTAR thick liquids. Recommend meds to be crushed and administered in puree. Recommend consider instrumental assessment while here in acute care. Pt has not had an objective assessment since 2022 and has had trials with SLP at bedside. ST will continue to follow acutely. Thank you for this referral, SLP Visit Diagnosis: Dysphagia, oropharyngeal phase (R13.12)    Aspiration Risk  Mild aspiration risk    Diet Recommendation Dysphagia 2 (Fine chop);Nectar-thick liquid    Liquid Administration via: Cup;No  straw Medication Administration: Crushed with puree Supervision: Staff to assist with self feeding;Full supervision/cueing for compensatory strategies Compensations: Slow rate;Small sips/bites Postural Changes: Seated upright at 90 degrees    Other  Recommendations Oral Care Recommendations: Oral care BID    Recommendations for follow up therapy are one component of a multi-disciplinary discharge planning process, led by the attending physician.  Recommendations may be updated based on patient status, additional functional criteria and insurance authorization.  Follow up Recommendations Skilled nursing-short term rehab (<3 hours/day)      Assistance Recommended at Discharge    Functional Status Assessment Patient has had a recent decline in their functional status and demonstrates the ability to make significant improvements in function in a reasonable and predictable amount of time.  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  He has a dysphagia, dysphagia, chronic contractures in  the lower extremities, history of bedsore, cognitive communication deficit, chronic neurogenic bladder with indwelling suprapubic catheter, hypertension, GERD, history of A-fib, history of second-degree AV block status post pacemaker in 2018, stage III CKD, right hemiplegia, generalized muscle weakness, history of gastrostomy subsequently removed, abnormal posture, bedbound status, OSA on nightly BiPAP, long-term care resident in [place].  His wife reports that she visits him daily at the facility.  She noticed yesterday that he was sleeping most of the day.   She thought it was due to the higher dose of melatonin that had been given to him over the last couple of days to help him sleep better and tolerate his BiPAP better.  When she came to visit him today she found him to look extremely unwell.  He had a fever.  He had respiratory distress.  He was pale and listless and noncommunicative.  EMS was called and he was placed on a nonrebreather and transported to the ED with severe sepsis with septic shock.  They determined that his suprapubic catheter was blocked and it was replaced and subsequently a liter of sediment contaminated fluid was eliminated.  His lactic acid was markedly elevated up to 5.2.  He was started on broad-spectrum antibiotic coverage and sepsis fluid boluses given.  His chest x-ray showed no active disease.  His urinalysis was significant for findings of infection.  CT brain did not show any acute findings.  The patient was noted to be significantly somnolent.  His blood pressures were notably soft.  Patient is being admitted to the stepdown ICU for treatment of severe sepsis with septic shock. Pt's wife reports most recent MBSS was completed in 2022 with recommendation for D3/NTL. Type of Study: Bedside Swallow Evaluation Previous Swallow Assessment: last MBSS 2022 per wife but none in chart Diet Prior to this Study: Dysphagia 2 (finely chopped);Thin liquids (Level 0) Temperature Spikes Noted: No Respiratory Status: Room air;Nasal cannula History of Recent Intubation: No Behavior/Cognition: Alert;Cooperative;Pleasant mood;Doesn't follow directions;Requires cueing Oral Cavity Assessment: Within Functional Limits Oral Care Completed by SLP: Recent completion by staff Oral Cavity - Dentition: Missing dentition;Adequate natural dentition Vision: Functional for self-feeding Self-Feeding Abilities: Able to feed self Patient Positioning: Upright in bed Baseline Vocal Quality: Normal;Low vocal intensity Volitional Cough: Cognitively unable to  elicit Volitional Swallow: Unable to elicit    Oral/Motor/Sensory Function Overall Oral Motor/Sensory Function: Within functional limits   Ice Chips Ice chips: Impaired Presentation: Spoon Oral Phase Impairments: Poor awareness of bolus Oral Phase Functional Implications: Prolonged oral transit;Oral residue;Oral holding Pharyngeal Phase Impairments: Multiple swallows;Cough - Delayed   Thin Liquid Thin Liquid: Impaired Presentation: Cup Oral Phase Impairments: Poor awareness of bolus Oral Phase Functional Implications: Right anterior spillage;Left anterior spillage;Prolonged oral transit;Oral holding Pharyngeal  Phase Impairments: Cough - Immediate;Cough - Delayed;Multiple swallows    Nectar Thick Nectar Thick Liquid: Within functional limits   Honey Thick Honey Thick Liquid: Not tested   Puree Puree: Within functional limits   Solid     Solid: Impaired Presentation: Spoon Oral Phase Impairments: Poor awareness of bolus Oral Phase Functional Implications: Prolonged oral transit     Angeliah Wisdom H. Romie Levee, CCC-SLP Speech Language Pathologist  Georgetta Haber 12/04/2022,2:32 PM

## 2022-12-04 NOTE — Progress Notes (Addendum)
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  His wife reports that she visits him daily at the facility.  She noticed yesterday that he was sleeping most of the day.  She thought it was due to the higher dose of melatonin that had been given to him over the last couple of days to help him sleep better and tolerate his BiPAP better.  When she came to visit him today she found him to look extremely unwell.  He had a fever.  He had respiratory distress.  He was pale and listless and noncommunicative.  EMS was called and he was placed on a nonrebreather and transported to the ED with severe sepsis with septic shock.  They determined that his suprapubic catheter was blocked and it was replaced and subsequently a liter of sediment contaminated fluid was eliminated.  His lactic acid was markedly elevated up to 5.2.  He was started on broad-spectrum antibiotic coverage and sepsis fluid boluses given.  His chest x-ray showed no active disease.  His urinalysis was significant for findings of infection.  CT brain did not show any acute findings.  The patient was noted to be significantly somnolent.  His blood pressures were notably soft.  Patient is being  admitted to the stepdown ICU for treatment of severe sepsis with septic shock.   Assessment/Plan: Severe Sepsis with septic shock  - secondary to urinary tract infection  - follow up urine culture - gram negative rods seen in preliminary report  - continue broad spectrum antibiotic coverage with IV cefepime dosed by pharm D - de-escalate antibiotics based on urine culture and sensitivity findings - sepsis orderset bundle fluid hydration ordered - trend lactate, down to 2.1 - continue aggressive supportive measures - admit to stepdown ICU - he is now OFF IV norepinephrine infusion for BP support - sepsis physiology seems to be resolving   Acute respiratory failure with hypoxia  - he initially required a nonrebreather -due to aspiration pneumonitis - wean down to nasal cannula--now on 2L - continue nightly bipap therapy which is supposed to do at longterm care but not fully compliant per wife  Aspiration pneumonitis -had another aspiration event on evening 12/03/22 but pt was not sitting up to eat -continue cefepime -12/04/22--repeat speech eval>>dys 2 with NTL -CT chest -PCT 3.81   Lactic Acidosis - severe - peaked at 5.2, now down to 2.1 which is reassuring of positive response to therapy    AKI on CKD stage 3b  - related to bladder outlet obstruction which is now relieved and severe dehydration and sepsis - treating with IV fluids  - hold lisinopril  - renally dose meds as able  - baseline creatinine 1.3-1.5 - serum creatinine peaked 5.42   Acute urinary retention  - relieved after changing out suprapubic catheter in ED by Dr. Criss Alvine on 11/10 -  urine appears to be flowing freely at this time - follow up with Dr. Retta Diones - his urologist     Dysphagia/Dysarthria - chronic  - these are late effects from his devastating intracerebral hemorrage in 2022 - wife reports that he is on dysphagia 3 diet with nectar thickened liquids - 12/04/22--speech therapy eval>dys 2 with     Leukocytosis - RESOLVED  - secondary to UTI with sepsis - WBC trended down to 9.5 with aggressive treatment    Hypercalcemia - Treated and RESOLVED  - Hydrated with IV normal saline as first line therapy - calcium down to 9.0    Paroxysmal atrial fibrillation  - he is no longer anticoagulated due to intracerebral hemorrhage - resumed metoprolol succinate but reduced dose by 50% due to soft BPs - follow on cardiac monitor    OSA - he is cared for by sleep specialist Dr. Vassie Loll - he is currently on nightly bipap but his compliance has been poor per wife - we have reordered nightly bipap for inpatient care   Type 2 diabetes mellitus  -12/01/22 A1C--5.5 -d/c sliding scale   Bilateral renal masses  - pt is followed by Dr. Ellin Saba for annual CT surveillance - wife reports pt had an ablation done on his right kidney years ago   GERD - famotidine ordered for GI protection    Goals of care - given severe co-morbidities and frailty would benefit from goals of care discussion, consulted palliative medicine team -remains full code        Family Communication:  no Family at bedside  Consultants:  palliative  Code Status:  FULL  DVT Prophylaxis:  SCDs   Procedures: As Listed in Progress Note Above  Antibiotics: Cefepime 11/10>>        Subjective: Pt is awake and alert.  No vomiting, no increase WOB.  Remainder ROS unobtainable  Objective: Vitals:   12/04/22 0455 12/04/22 0732 12/04/22 0900 12/04/22 1339  BP:   (!) 144/86 (!) 146/84  Pulse:  75 95 93  Resp:  16 16 18   Temp:    100.1 F (37.8 C)  TempSrc:      SpO2:  98% 99% 99%  Weight: 72.2 kg     Height:        Intake/Output Summary (Last 24 hours) at 12/04/2022 1816 Last data filed at 12/04/2022 1700 Gross per 24 hour  Intake 1230.79 ml  Output 2350 ml  Net -1119.21 ml   Weight change: -4.4 kg Exam:  General:  Pt is alert, follows commands appropriately, not in acute distress HEENT: No  icterus, No thrush, No neck mass, [place]/AT Cardiovascular: RRR, S1/S2, no rubs, no gallops Respiratory:bibasilar rales. No wheeze Abdomen: Soft/+BS, non tender, non distended, no guarding Extremities: No edema, No lymphangitis, No petechiae, No rashes, no synovitis   Data Reviewed: I have personally reviewed following labs and imaging studies Basic Metabolic Panel: Recent Labs  Lab 12/01/22 1127 12/02/22 0343 12/03/22 0459 12/04/22 0410  NA 137 137 143 144  K 4.8 4.0 3.5 3.5  CL 100 108 113* 119*  CO2 19* 18* 21* 22  GLUCOSE 161* 88 104* 116*  BUN 74* 80* 66* 52*  CREATININE 5.42* 4.51* 3.13* 2.44*  CALCIUM 10.4* 9.0 9.3 8.9  MG  --  1.8 2.2 2.1  PHOS  --  3.1 2.4* 2.7   Liver Function Tests: Recent Labs  Lab 12/01/22 1127 12/02/22 0343 12/03/22 0459 12/04/22 0410  AST 22  --   --   --  ALT 15  --   --   --   ALKPHOS 120  --   --   --   BILITOT 0.9  --   --   --   PROT 8.8*  --   --   --   ALBUMIN 3.6 2.4* 2.4* 2.4*   No results for input(s): "LIPASE", "AMYLASE" in the last 168 hours. No results for input(s): "AMMONIA" in the last 168 hours. Coagulation Profile: Recent Labs  Lab 12/01/22 1127  INR 1.3*   CBC: Recent Labs  Lab 12/01/22 1127 12/02/22 0343 12/03/22 0459  WBC 21.2* 8.6 9.5  NEUTROABS 17.8* 6.9 7.4  HGB 15.3 10.7* 10.8*  HCT 46.6 33.6* 34.3*  MCV 89.1 89.6 90.0  PLT 398 198 219   Cardiac Enzymes: No results for input(s): "CKTOTAL", "CKMB", "CKMBINDEX", "TROPONINI" in the last 168 hours. BNP: Invalid input(s): "POCBNP" CBG: Recent Labs  Lab 12/03/22 2138 12/04/22 0313 12/04/22 0726 12/04/22 1132 12/04/22 1645  GLUCAP 189* 103* 116* 110* 109*   HbA1C: No results for input(s): "HGBA1C" in the last 72 hours. Urine analysis:    Component Value Date/Time   COLORURINE YELLOW 12/01/2022 1413   APPEARANCEUR TURBID (A) 12/01/2022 1413   APPEARANCEUR Clear 06/29/2021 1121   LABSPEC 1.011 12/01/2022 1413   PHURINE 8.0 12/01/2022 1413    GLUCOSEU NEGATIVE 12/01/2022 1413   HGBUR SMALL (A) 12/01/2022 1413   BILIRUBINUR NEGATIVE 12/01/2022 1413   BILIRUBINUR Negative 06/29/2021 1121   KETONESUR NEGATIVE 12/01/2022 1413   PROTEINUR >=300 (A) 12/01/2022 1413   NITRITE NEGATIVE 12/01/2022 1413   LEUKOCYTESUR LARGE (A) 12/01/2022 1413   Sepsis Labs: @LABRCNTIP (procalcitonin:4,lacticidven:4) ) Recent Results (from the past 240 hour(s))  Blood Culture (routine x 2)     Status: None (Preliminary result)   Collection Time: 12/01/22 11:27 AM   Specimen: Right Antecubital; Blood  Result Value Ref Range Status   Specimen Description   Final    RIGHT ANTECUBITAL BOTTLES DRAWN AEROBIC AND ANAEROBIC   Special Requests Blood Culture adequate volume  Final   Culture   Final    NO GROWTH 3 DAYS Performed at [facility], [address] [place], [place] [address]    Report Status PENDING  Incomplete  Blood Culture (routine x 2)     Status: None (Preliminary result)   Collection Time: 12/01/22 11:55 AM   Specimen: BLOOD LEFT HAND  Result Value Ref Range Status   Specimen Description BLOOD LEFT HAND AEROBIC BOTTLE ONLY  Final   Special Requests   Final    Blood Culture results may not be optimal due to an inadequate volume of blood received in culture bottles   Culture   Final    NO GROWTH 3 DAYS Performed at [facility], [address] [place], [place] [address]    Report Status PENDING  Incomplete  Resp panel by RT-PCR (RSV, Flu A&B, Covid) Anterior Nasal Swab     Status: None   Collection Time: 12/01/22  2:13 PM   Specimen: Anterior Nasal Swab  Result Value Ref Range Status   SARS Coronavirus 2 by RT PCR NEGATIVE NEGATIVE Final    Comment: (NOTE) SARS-CoV-2 target nucleic acids are NOT DETECTED.  The SARS-CoV-2 RNA is generally detectable in upper respiratory specimens during the acute phase of infection. The lowest concentration of SARS-CoV-2 viral copies this assay can detect is 138 copies/mL. A negative result  does not preclude SARS-Cov-2 infection and should not be used as the sole basis for treatment or  other patient management decisions. A negative result may occur with  improper specimen collection/handling, submission of specimen other than nasopharyngeal swab, presence of viral mutation(s) within the areas targeted by this assay, and inadequate number of viral copies(<138 copies/mL). A negative result must be combined with clinical observations, patient history, and epidemiological information. The expected result is Negative.  Fact Sheet for Patients:  BloggerCourse.com  Fact Sheet for Healthcare Providers:  SeriousBroker.it  This test is no t yet approved or cleared by the [place] FDA and  has been authorized for detection and/or diagnosis of SARS-CoV-2 by FDA under an Emergency Use Authorization (EUA). This EUA will remain  in effect (meaning this test can be used) for the duration of the COVID-19 declaration under Section 564(b)(1) of the Act, 21 U.S.C.section 360bbb-3(b)(1), unless the authorization is terminated  or revoked sooner.       Influenza A by PCR NEGATIVE NEGATIVE Final   Influenza B by PCR NEGATIVE NEGATIVE Final    Comment: (NOTE) The Xpert Xpress SARS-CoV-2/FLU/RSV plus assay is intended as an aid in the diagnosis of influenza from Nasopharyngeal swab specimens and should not be used as a sole basis for treatment. Nasal washings and aspirates are unacceptable for Xpert Xpress SARS-CoV-2/FLU/RSV testing.  Fact Sheet for Patients: BloggerCourse.com  Fact Sheet for Healthcare Providers: SeriousBroker.it  This test is not yet approved or cleared by the [place] FDA and has been authorized for detection and/or diagnosis of SARS-CoV-2 by FDA under an Emergency Use Authorization (EUA). This EUA will remain in effect (meaning this test can be used) for  the duration of the COVID-19 declaration under Section 564(b)(1) of the Act, 21 U.S.C. section 360bbb-3(b)(1), unless the authorization is terminated or revoked.     Resp Syncytial Virus by PCR NEGATIVE NEGATIVE Final    Comment: (NOTE) Fact Sheet for Patients: BloggerCourse.com  Fact Sheet for Healthcare Providers: SeriousBroker.it  This test is not yet approved or cleared by the [place] FDA and has been authorized for detection and/or diagnosis of SARS-CoV-2 by FDA under an Emergency Use Authorization (EUA). This EUA will remain in effect (meaning this test can be used) for the duration of the COVID-19 declaration under Section 564(b)(1) of the Act, 21 U.S.C. section 360bbb-3(b)(1), unless the authorization is terminated or revoked.  Performed at [facility], [address] [place], [place] [address]   Urine Culture (for pregnant, neutropenic or urologic patients or patients with an indwelling urinary catheter)     Status: Abnormal   Collection Time: 12/01/22  2:13 PM   Specimen: Urine, Suprapubic  Result Value Ref Range Status   Specimen Description   Final    URINE, SUPRAPUBIC Performed at [facility], [address], [place], [place] [address]    Special Requests   Final    NONE Performed at [facility], [address], [place], [place] [address]    Culture 80,000 COLONIES/mL PROVIDENCIA STUARTII (A)  Final   Report Status 12/03/2022 FINAL  Final   Organism ID, Bacteria PROVIDENCIA STUARTII (A)  Final      Susceptibility   Providencia stuartii - MIC*    AMPICILLIN >=32 RESISTANT Resistant     CEFEPIME <=0.12 SENSITIVE Sensitive     CEFTRIAXONE <=0.25 SENSITIVE Sensitive     CIPROFLOXACIN >=4 RESISTANT Resistant     GENTAMICIN RESISTANT Resistant     IMIPENEM 4 SENSITIVE Sensitive     NITROFURANTOIN 128 RESISTANT Resistant     TRIMETH/SULFA <=20 SENSITIVE Sensitive  AMPICILLIN/SULBACTAM >=32 RESISTANT  Resistant     PIP/TAZO >=128 RESISTANT Resistant ug/mL    * 80,000 COLONIES/mL PROVIDENCIA STUARTII  MRSA Next Gen by PCR, Nasal     Status: Abnormal   Collection Time: 12/01/22  9:45 PM   Specimen: Nasal Mucosa; Nasal Swab  Result Value Ref Range Status   MRSA by PCR Next Gen DETECTED (A) NOT DETECTED Final    Comment: RESULT CALLED TO, READ BACK BY AND VERIFIED WITH: ALCIA FAWYER @ 1612 ON 12/02/22 C VARNER (NOTE) The GeneXpert MRSA Assay (FDA approved for NASAL specimens only), is one component of a comprehensive MRSA colonization surveillance program. It is not intended to diagnose MRSA infection nor to guide or monitor treatment for MRSA infections. Test performance is not FDA approved in patients less than 42 years old. Performed at [facility], [address] [place], [place] [address]      Scheduled Meds:  Chlorhexidine Gluconate Cloth  6 each Topical Daily   Chlorhexidine Gluconate Cloth  6 each Topical Q0600   famotidine  20 mg Oral Daily   feeding supplement  237 mL Oral BID BM   FLUoxetine  20 mg Oral Daily   melatonin  6 mg Oral QHS   metoprolol succinate  25 mg Oral BID   multivitamin with minerals  1 tablet Oral Daily   mupirocin ointment  1 Application Nasal BID   Muscle Rub  1 Application Topical BID   mouth rinse  15 mL Mouth Rinse 4 times per day   simvastatin  40 mg Oral q1800   sodium bicarbonate  650 mg Oral BID   Continuous Infusions:  ceFEPime (MAXIPIME) IV 200 mL/hr at 12/04/22 1303   norepinephrine (LEVOPHED) Adult infusion Stopped (12/02/22 1638)    Procedures/Studies: DG CHEST PORT 1 VIEW  Result Date: 12/04/2022 CLINICAL DATA:  [id number] Aspiration into airway [id number]. EXAM: PORTABLE CHEST 1 VIEW COMPARISON:  Chest radiograph 12/02/2022. FINDINGS: Left chest dual-chamber pacemaker with leads projecting over the right atrium and ventricle. Spinal cord stimulator leads project over the lower thoracic spine. No consolidation or pulmonary edema.  Stable cardiac and mediastinal contours. No pleural effusion or pneumothorax. IMPRESSION: No evidence of acute cardiopulmonary disease. Electronically Signed   By: Orvan Falconer M.D.   On: 12/04/2022 11:15   DG CHEST PORT 1 VIEW  Result Date: 12/02/2022 CLINICAL DATA:  [id number] Severe sepsis (HCC) [id number] EXAM: PORTABLE CHEST 1 VIEW COMPARISON:  Chest x-ray 12/01/2022, pet ct 09/07/21 FINDINGS: Left chest wall dual lead pacemaker. Persistent cardiomegaly. The heart and mediastinal contours are unchanged. No focal consolidation. No pulmonary edema. No pleural effusion. No pneumothorax. No acute osseous abnormality. IMPRESSION: No active disease. Electronically Signed   By: Tish Frederickson M.D.   On: 12/02/2022 21:49   DG Chest Port 1 View  Result Date: 12/01/2022 CLINICAL DATA:  Fever, hypoxia. EXAM: PORTABLE CHEST 1 VIEW COMPARISON:  Chest CT dated 08/17/2021. FINDINGS: The heart is mildly enlarged. Vascular calcifications are seen in the aortic arch. A left subclavian approach cardiac device is redemonstrated. The lungs are clear. Degenerative changes are seen in the spine. IMPRESSION: No active disease. Cardiomegaly. Electronically Signed   By: Romona Curls M.D.   On: 12/01/2022 13:35   CT Head Wo Contrast  Result Date: 12/01/2022 CLINICAL DATA:  Mental status change, unknown cause EXAM: CT HEAD WITHOUT CONTRAST TECHNIQUE: Contiguous axial images were obtained from the base of the skull through the vertex without intravenous contrast. RADIATION DOSE REDUCTION: This exam was performed  according to the departmental dose-optimization program which includes automated exposure control, adjustment of the mA and/or kV according to patient size and/or use of iterative reconstruction technique. COMPARISON:  CT head 05/05/21 FINDINGS: Brain: No hemorrhage. No hydrocephalus. No extra-axial fluid collection. No CT evidence of an acute cortical infarct. No mass effect. No mass lesion. 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 Skull: Normal. Negative for fracture or focal lesion. Sinuses/Orbits: No middle ear or mastoid effusion. Paranasal sinuses are clear. Left lens replacement. Orbits are otherwise unremarkable. Other: None. IMPRESSION: 1. No hemorrhage or CT evidence of an acute cortical infarct. 2. 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 Electronically Signed   By: Lorenza Cambridge M.D.   On: 12/01/2022 13:04   CUP PACEART REMOTE DEVICE CHECK  Result Date: 11/07/2022 Scheduled remote reviewed. Normal device function.  Next remote 91 days. ML, CVRS   Catarina Hartshorn, DO  [facility]  If 7PM-7AM, please contact night-coverage [email or web address] Password [email or web address] 12/04/2022, 6:16 PM   LOS: 3 days

## 2022-12-04 NOTE — [place] FL2 (Signed)
[place] MEDICAID FL2 LEVEL OF CARE FORM     IDENTIFICATION  Patient Name: Dylan Martin Birthdate: 02-May-1945 Sex: male Admission Date (Current Location): 12/01/2022  [facility] and [place] Number:  [employer] and Address:  [facility],  [address] [place] [address]      Provider Number: [id number]  Attending Physician Name and Address:  Catarina Hartshorn, MD  Relative Name and Phone Number:       Current Level of Care: Hospital Recommended Level of Care: Skilled Nursing Facility Prior Approval Number:    Date Approved/Denied:   PASRR Number:    Discharge Plan: SNF    Current Diagnoses: Patient Active Problem List   Diagnosis Date Noted   Malnutrition of moderate degree 12/02/2022   Severe sepsis with septic shock (CODE) (HCC) 12/01/2022   Lactic acidosis 12/01/2022   AKI (acute kidney injury) (HCC) 12/01/2022   Hypercalcemia 12/01/2022   Leukocytosis 12/01/2022   Metabolic acidosis 12/01/2022   Acute respiratory failure with hypoxia (HCC) 12/01/2022   Fever, unspecified 12/01/2022   Acute urinary retention blockage of suprapubic catheter 12/01/2022   OSA (obstructive sleep apnea) 08/06/2021   Presence of cardiac pacemaker 07/11/2021   Cognitive communication deficit 07/11/2021   Chronic kidney disease, stage 3 unspecified (HCC) 07/11/2021   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 07/11/2021   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   Hemiplegia (HCC) 07/11/2021   Hyperlipidemia 07/11/2021   Muscle weakness (generalized) 07/11/2021   Contracture, right knee 07/11/2021   Contracture, left knee 07/11/2021   Type 2 diabetes mellitus without complications (HCC) 07/11/2021   Paroxysmal atrial fibrillation (HCC) 07/11/2021   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HCC) 07/11/2021   Abnormal posture 07/11/2021   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) 07/11/2021   Unspecified atrial fibrillation (HCC)  07/11/2021   Bilateral renal masses 06/29/2021   Counseling, unspecified 06/07/2021   Esophageal reflux 06/07/2021   Essential hypertension 06/07/2021   Gout 06/07/2021   Late effects of cerebrovascular disease 06/07/2021   Neurogenic bladder 05/21/2021   Encounter for care or replacement of suprapubic tube (HCC) 05/21/2021   History of intracranial hemorrhage 11/21/2020   Aphasia following nontraumatic intracerebral hemorrhage 08/17/2020   Right hemiparesis (HCC) 08/17/2020   ICH (intracerebral hemorrhage) (HCC) 07/27/2020   Bradycardia 04/12/2016    Orientation RESPIRATION BLADDER Height & Weight        O2 Incontinent, Indwelling catheter Weight: 159 lb 2.8 oz (72.2 kg) Height:  5\' 11"  (180.3 cm)  BEHAVIORAL SYMPTOMS/MOOD NEUROLOGICAL BOWEL NUTRITION STATUS      Incontinent Diet (DYS 3, see D/C Summary)  AMBULATORY STATUS COMMUNICATION OF NEEDS Skin   Extensive Assist Verbally Other (Comment) (lower mid sacrum stage 2 and left heel stage 2)                       Personal Care Assistance Level of Assistance  Bathing, Feeding Bathing Assistance: Maximum assistance Feeding assistance: Limited assistance       Functional Limitations Info  Sight, Hearing, Speech Sight Info: Adequate Hearing Info: Adequate Speech Info: Adequate    SPECIAL CARE FACTORS FREQUENCY  PT (By licensed PT), OT (By licensed OT)     PT Frequency: 5 times weekly OT Frequency: 5 times weekly            Contractures Contractures Info: Present    Additional Factors Info  Code Status, Allergies Code Status Info: FULL Allergies Info: Asa (Aspirin), Other, Shrimp (Diagnostic)  Current Medications (12/04/2022):  This is the current hospital active medication list Current Facility-Administered Medications  Medication Dose Route Frequency Provider Last Rate Last Admin   acetaminophen (TYLENOL) tablet 650 mg  650 mg Oral Q6H PRN Johnson, Clanford L, MD       Or   acetaminophen  (TYLENOL) suppository 650 mg  650 mg Rectal Q6H PRN Laural Benes, Clanford L, MD   650 mg at 12/01/22 2303   bisacodyl (DULCOLAX) EC tablet 5 mg  5 mg Oral Daily PRN Johnson, Clanford L, MD       bisacodyl (DULCOLAX) suppository 10 mg  10 mg Rectal Daily PRN Johnson, Clanford L, MD       ceFEPIme (MAXIPIME) 2 g in sodium chloride 0.9 % 100 mL IVPB  2 g Intravenous Q24H Johnson, Clanford L, MD   Stopped at 12/03/22 1342   Chlorhexidine Gluconate Cloth 2 % PADS 6 each  6 each Topical Daily Laural Benes, Clanford L, MD   6 each at 12/03/22 1024   Chlorhexidine Gluconate Cloth 2 % PADS 6 each  6 each Topical Q0600 Laural Benes, Clanford L, MD   6 each at 12/04/22 0500   famotidine (PEPCID) tablet 20 mg  20 mg Oral Daily Johnson, Clanford L, MD   20 mg at 12/03/22 1021   feeding supplement (ENSURE ENLIVE / ENSURE PLUS) liquid 237 mL  237 mL Oral BID BM Johnson, Clanford L, MD   237 mL at 12/03/22 1500   fentaNYL (SUBLIMAZE) injection 12.5 mcg  12.5 mcg Intravenous Q2H PRN Johnson, Clanford L, MD       FLUoxetine (PROZAC) capsule 20 mg  20 mg Oral Daily Johnson, Clanford L, MD   20 mg at 12/03/22 1021   insulin aspart (novoLOG) injection 0-6 Units  0-6 Units Subcutaneous TID WC Johnson, Clanford L, MD   1 Units at 12/03/22 1341   melatonin tablet 6 mg  6 mg Oral QHS Johnson, Clanford L, MD   6 mg at 12/02/22 2116   metoprolol succinate (TOPROL-XL) 24 hr tablet 25 mg  25 mg Oral BID Johnson, Clanford L, MD   25 mg at 12/03/22 1021   multivitamin with minerals tablet 1 tablet  1 tablet Oral Daily Laural Benes, Clanford L, MD   1 tablet at 12/03/22 1021   mupirocin ointment (BACTROBAN) 2 % 1 Application  1 Application Nasal BID Standley Dakins L, MD   1 Application at 12/03/22 2147   Muscle Rub CREA 1 Application  1 Application Topical BID Laural Benes, Clanford L, MD   1 Application at 12/03/22 1023   nitroGLYCERIN (NITROSTAT) SL tablet 0.4 mg  0.4 mg Sublingual Q5 min PRN Johnson, Clanford L, MD       norepinephrine (LEVOPHED)  4mg  in (0.016 mg/mL) premix infusion  2-10 mcg/min Intravenous Titrated Zierle-Ghosh, Asia B, DO   Stopped at 12/02/22 1638   ondansetron (ZOFRAN) tablet 4 mg  4 mg Oral Q6H PRN Johnson, Clanford L, MD       Or   ondansetron (ZOFRAN) injection 4 mg  4 mg Intravenous Q6H PRN Johnson, Clanford L, MD       Oral care mouth rinse  15 mL Mouth Rinse 4 times per day Laural Benes, Clanford L, MD   15 mL at 12/03/22 1830   Oral care mouth rinse  15 mL Mouth Rinse PRN Johnson, Clanford L, MD       oxyCODONE (Oxy IR/ROXICODONE) immediate release tablet 2.5 mg  2.5 mg Oral Q6H PRN Cleora Fleet, MD  simvastatin (ZOCOR) tablet 40 mg  40 mg Oral q1800 Johnson, Clanford L, MD   40 mg at 12/03/22 1737   sodium bicarbonate tablet 650 mg  650 mg Oral BID Laural Benes, Clanford L, MD   650 mg at 12/03/22 1021     Discharge Medications: Please see discharge summary for a list of discharge medications.  Relevant Imaging Results:  Relevant Lab Results:   Additional Information SSN: [id number], [place]

## 2022-12-04 NOTE — Progress Notes (Signed)
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  He has a dysphagia, dysphagia, chronic contractures in the lower extremities, history of bedsore, cognitive communication deficit, chronic neurogenic bladder with indwelling suprapubic catheter, hypertension, GERD, history of A-fib, history of second-degree AV block status post pacemaker in 2018, stage III CKD, right hemiplegia, generalized muscle weakness, history of gastrostomy subsequently removed, abnormal posture, bedbound status, OSA on nightly BiPAP, long-term care resident in [place].  His wife reports that she visits him daily at the facility.  She noticed yesterday that he was sleeping most of the day.  She thought it was due to the higher dose of melatonin that had been given to him over the last couple of days to help him sleep better and tolerate his BiPAP better.  When she came to visit him today she found him to look extremely unwell.  He had a fever.  He had respiratory distress.  He was pale and listless and noncommunicative.  EMS was called and he was placed on a nonrebreather and transported to the ED with severe sepsis with septic shock.  They determined that his suprapubic catheter was blocked and it was replaced and subsequently a liter of sediment contaminated fluid was eliminated.  His lactic acid was markedly elevated up to 5.2.  He was started on broad-spectrum antibiotic coverage and sepsis fluid boluses given.  His chest x-ray showed no active disease.  His urinalysis was significant for findings of infection.  CT brain did not show any acute findings.  The patient was noted to be significantly somnolent.  His blood pressures  were notably soft.  Patient is being admitted to the stepdown ICU for treatment of severe sepsis with septic shock.    PT Comments  Patient demonstrates slow labored movement for sitting up at bedside, once seated able to maintain sitting balance holding onto bed rail using left hand, tolerated sitting up at bedside for up to 20 minutes before leaning backwards due to fatigue and required Max assist to reposition when put back to bed.  Patient will benefit from continued skilled physical therapy in hospital and recommended venue below to increase strength, balance, endurance for safe ADLs and gait.      If plan is discharge home, recommend the following: A lot of help with bathing/dressing/bathroom;A lot of help with walking and/or transfers;Help with stairs or ramp for entrance;Assistance with cooking/housework   Can travel by private vehicle     No  Equipment Recommendations  None recommended by PT    Recommendations for Other Services       Precautions / Restrictions Precautions Precautions: Fall Restrictions Weight Bearing Restrictions: No     Mobility  Bed Mobility Overal bed mobility: Needs Assistance Bed Mobility: Supine to Sit, Sit to Supine     Supine to sit: Max assist Sit to supine: Max assist   General bed mobility comments: able to maintain sitting balance holding onto bedrail using LUE    Transfers                        Ambulation/Gait                   Stairs  Wheelchair Mobility     Tilt Bed    Modified Rankin (Stroke Patients Only)       Balance Overall balance assessment: Needs assistance Sitting-balance support: Feet supported, No upper extremity supported Sitting balance-Leahy Scale: Poor Sitting balance - Comments: seated at EOB                                    Cognition Arousal: Alert Behavior During Therapy: Flat affect, Impulsive, Restless Overall Cognitive Status: History of  cognitive impairments - at baseline                                 General Comments: Patient mostly non-verbal, requires Max tactile/verbal cueing for participation        Exercises      General Comments        Pertinent Vitals/Pain Pain Assessment Pain Assessment: Faces Faces Pain Scale: Hurts little more Pain Location: bilateral knees with end range stretching Pain Descriptors / Indicators: Grimacing, Guarding, Discomfort Pain Intervention(s): Limited activity within patient's tolerance, Monitored during session, Repositioned    Home Living                          Prior Function            PT Goals (current goals can now be found in the care plan section) Acute Rehab PT Goals Patient Stated Goal: not stated PT Goal Formulation: With patient/family Time For Goal Achievement: 12/16/22 Potential to Achieve Goals: Fair Progress towards PT goals: Progressing toward goals    Frequency    Min 2X/week      PT Plan      Co-evaluation              AM-PAC PT "6 Clicks" Mobility   Outcome Measure  Help needed turning from your back to your side while in a flat bed without using bedrails?: A Lot Help needed moving from lying on your back to sitting on the side of a flat bed without using bedrails?: A Lot Help needed moving to and from a bed to a chair (including a wheelchair)?: Total Help needed standing up from a chair using your arms (e.g., wheelchair or bedside chair)?: Total Help needed to walk in hospital room?: Total Help needed climbing 3-5 steps with a railing? : Total 6 Click Score: 8    End of Session Equipment Utilized During Treatment: Oxygen Activity Tolerance: Patient tolerated treatment well;Patient limited by fatigue Patient left: in bed;with call bell/phone within reach;with family/visitor present Nurse Communication: Mobility status PT Visit Diagnosis: Unsteadiness on feet (R26.81);Other abnormalities of gait and  mobility (R26.89);Muscle weakness (generalized) (M62.81)     Time: 1451-1520 PT Time Calculation (min) (ACUTE ONLY): 29 min  Charges:    $Therapeutic Exercise: 23-37 mins PT General Charges $$ ACUTE PT VISIT: 1 Visit                     3:48 PM, 12/04/22 Ocie Bob, MPT Physical Therapist with [facility] [phone] office [phone] mobile phone

## 2022-12-04 NOTE — Progress Notes (Signed)
Palliative:    Dylan Martin is lying quietly in bed with his eyes closed.  He appears acutely/chronically ill and very frail.  He will open his eyes but not make eye contact.  He does not answer when asked his name.  I do not believe that he can make his basic needs known.  His wife, Dylan Martin, and his son Dylan Martin, are present at bedside.  We talk about Dylan Martin's aspiration event yesterday evening.  We talked about respiratory therapy and suctioning.  We talked about risk for aspiration pneumonia.  Mrs. Folds states her concern about her husband's ability to safely eat and drink.  We talked about speech therapy consult.    We briefly talk about CODE STATUS today.  Mrs. Shave states, "he is going to turn around from this", stating he would remain full scope/full code.   Conference with son, Dylan Martin, outside of the room.  He shares his concerns about his father's declines and his ability to recover.  He tells me that he is trying to "get through to" his stepmother.  Conference with attending, bedside nursing staff, transition of care team related to patient condition, needs, goals of care, disposition.  Plan: At this point full scope/full code.  Time for outcomes.  Anticipate return to [facility] under long-term care if/when able.  50 minutes  Lillia Carmel, NP Palliative Medicine Team  Team phone [phone]

## 2022-12-04 NOTE — TOC Progression Note (Addendum)
Transition of Care [facility]) - Progression Note    Patient Details  Name: Dylan Martin MRN: [id number] Date of Birth: [birth year]  Transition [facility] CM/SW Contact  Villa Herb, [place] Phone Number: 12/04/2022, 11:31 AM  Clinical Narrative:    Pts wife reached out to this CSW to request that SNF referral be sent to facilities in [place]. CSW explained that referral can be sent out but if there are no bed offers pt will need to return to [facility]. TOC to follow.  Addendum: CSW updated pts spouse that [facility] and healthcare offered a bed for pt. Pts wife would like to accept this bed offer. CSW updated Revonda Standard with SNF of this. TOC to follow.   Expected Discharge Plan: Skilled Nursing Facility Barriers to Discharge: Continued Medical Work up  Expected Discharge Plan and Services In-house Referral: Clinical Social Work Discharge Planning Services: CM Consult Post Acute Care Choice: Skilled Nursing Facility Living arrangements for the past 2 months: Skilled Nursing Facility                                       Social Determinants of Health (SDOH) Interventions SDOH Screenings   Food Insecurity: Patient Unable To Answer (12/01/2022)  Housing: Patient Declined (12/01/2022)  Transportation Needs: No Transportation Needs (12/01/2022)  Utilities: Not At Risk (12/01/2022)  Depression (PHQ2-9): Low Risk  (07/19/2021)  Social Connections: Unknown (03/19/2022)   Received from [facility]  Tobacco Use: Low Risk  (12/02/2022)    Readmission Risk Interventions    12/02/2022    2:50 PM  Readmission Risk Prevention Plan  Transportation Screening Complete  Home Care Screening Complete  Medication Review (RN CM) Complete

## 2022-12-04 NOTE — Plan of Care (Signed)
  Problem: Metabolic: Goal: Ability to maintain appropriate glucose levels will improve Outcome: Progressing   

## 2022-12-05 DIAGNOSIS — Z515 Encounter for palliative care: Secondary | ICD-10-CM

## 2022-12-05 DIAGNOSIS — I4891 Unspecified atrial fibrillation: Secondary | ICD-10-CM | POA: Diagnosis not present

## 2022-12-05 DIAGNOSIS — J690 Pneumonitis due to inhalation of food and vomit: Secondary | ICD-10-CM | POA: Diagnosis not present

## 2022-12-05 DIAGNOSIS — R6521 Severe sepsis with septic shock: Secondary | ICD-10-CM | POA: Diagnosis not present

## 2022-12-05 DIAGNOSIS — Z7189 Other specified counseling: Secondary | ICD-10-CM

## 2022-12-05 DIAGNOSIS — N179 Acute kidney failure, unspecified: Secondary | ICD-10-CM | POA: Diagnosis not present

## 2022-12-05 LAB — BASIC METABOLIC PANEL
Anion gap: 11 (ref 5–15)
BUN: 43 mg/dL — ABNORMAL HIGH (ref 8–23)
CO2: 24 mmol/L (ref 22–32)
Calcium: 9.2 mg/dL (ref 8.9–10.3)
Chloride: 114 mmol/L — ABNORMAL HIGH (ref 98–111)
Creatinine, Ser: 2.04 mg/dL — ABNORMAL HIGH (ref 0.61–1.24)
GFR, Estimated: 33 mL/min — ABNORMAL LOW (ref >60)
Glucose, Bld: 109 mg/dL — ABNORMAL HIGH (ref 70–99)
Potassium: 3.7 mmol/L (ref 3.5–5.1)
Sodium: 149 mmol/L — ABNORMAL HIGH (ref 135–145)

## 2022-12-05 LAB — GLUCOSE, CAPILLARY
Glucose-Capillary: 109 mg/dL — ABNORMAL HIGH (ref 70–99)
Glucose-Capillary: 109 mg/dL — ABNORMAL HIGH (ref 70–99)
Glucose-Capillary: 155 mg/dL — ABNORMAL HIGH (ref 70–99)
Glucose-Capillary: 141 mg/dL — ABNORMAL HIGH (ref 70–99)
Glucose-Capillary: 156 mg/dL — ABNORMAL HIGH (ref 70–99)

## 2022-12-05 LAB — RENAL FUNCTION PANEL
Albumin: 2.4 g/dL — ABNORMAL LOW (ref 3.5–5.0)
Anion gap: 13 (ref 5–15)
BUN: 44 mg/dL — ABNORMAL HIGH (ref 8–23)
CO2: 23 mmol/L (ref 22–32)
Calcium: 9.2 mg/dL (ref 8.9–10.3)
Chloride: 113 mmol/L — ABNORMAL HIGH (ref 98–111)
Creatinine, Ser: 2.04 mg/dL — ABNORMAL HIGH (ref 0.61–1.24)
GFR, Estimated: 33 mL/min — ABNORMAL LOW (ref >60)
Glucose, Bld: 108 mg/dL — ABNORMAL HIGH (ref 70–99)
Phosphorus: 2.6 mg/dL (ref 2.5–4.6)
Potassium: 3.7 mmol/L (ref 3.5–5.1)
Sodium: 149 mmol/L — ABNORMAL HIGH (ref 135–145)

## 2022-12-05 LAB — CBC
HCT: 34 % — ABNORMAL LOW (ref 39.0–52.0)
Hemoglobin: 10.7 g/dL — ABNORMAL LOW (ref 13.0–17.0)
MCH: 28.9 pg (ref 26.0–34.0)
MCHC: 31.5 g/dL (ref 30.0–36.0)
MCV: 91.9 fL (ref 80.0–100.0)
Platelets: 251 10*3/uL (ref 150–400)
RBC: 3.7 MIL/uL — ABNORMAL LOW (ref 4.22–5.81)
RDW: 15.1 % (ref 11.5–15.5)
WBC: 11.2 10*3/uL — ABNORMAL HIGH (ref 4.0–10.5)
nRBC: 0 % (ref 0.0–0.2)

## 2022-12-05 LAB — MAGNESIUM: Magnesium: 1.9 mg/dL (ref 1.7–2.4)

## 2022-12-05 MED ORDER — DEXTROSE 5 % IV SOLN: INTRAVENOUS | Status: AC

## 2022-12-05 NOTE — Progress Notes (Signed)
Pharmacy Antibiotic Note  Dylan Martin is a 77 y.o. male admitted on 12/01/2022 with  unknown source .  Pharmacy has been consulted for cefepime.  Patient afebrile overnight, wbc 11. AKI slowly improving with scr down to 2.0. No dose adjustments for abx at this time.   Blood cultures no growth to date.  Urine culture growing providencia. Could consider narrowing to ceftriaxone.   Plan: Cefepime 2gm IV q24h F/U cxs and clinical progress Monitor V/S, labs and levels as indicated  Height: 5\' 11"  (180.3 cm) Weight: 72 kg (158 lb 11.7 oz) IBW/kg (Calculated) : 75.3  Temp (24hrs), Avg:98.9 F (37.2 C), Min:97.4 F (36.3 C), Max:100.1 F (37.8 C)  Recent Labs  Lab 12/01/22 1127 12/01/22 1306 12/01/22 1641 12/02/22 0343 12/03/22 0459 12/04/22 0410 12/05/22 0413  WBC 21.2*  --   --  8.6 9.5  --  11.2*  CREATININE 5.42*  --   --  4.51* 3.13* 2.44* 2.04*  2.04*  LATICACIDVEN 3.9* 5.2* 2.1*  --   --   --   --   VANCORANDOM  --   --   --   --  11  --   --     Estimated Creatinine Clearance: 29.9 mL/min (A) (by C-G formula based on SCr of 2.04 mg/dL (H)).    Allergies  Allergen Reactions   Asa [Aspirin] Other (See Comments)    Hx of aneurysm, told to avoid by MD   Other Other (See Comments)    Blood thinners- wife wants documented that patient should not be on blood thinners due to past medical history (Hx of aneurysm) Red meat - causes gout    Shrimp (Diagnostic) Other (See Comments)    Gout flare    Antimicrobials this admission: cefepime 11/10 >>  vancomycin 11/10  Flagyl 11/10>>  Microbiology results: 11/10 BCx: ngtd 11/10 UCx: provide   MRSA PCR:   Thank you for allowing pharmacy to be a part of this patient's care.  Sheppard Coil PharmD., BCPS Clinical Pharmacist 12/05/2022 11:10 AM

## 2022-12-05 NOTE — Progress Notes (Signed)
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  His wife reports that she visits him daily at the facility.  She noticed yesterday that he was sleeping most of the day.  She thought it was due to the higher dose of melatonin that had been given to him over the last couple of days to help him sleep better and tolerate his BiPAP better.  When she came to visit him today she found him to look extremely unwell.  He had a fever.  He had respiratory distress.  He was pale and listless and noncommunicative.  EMS was called and he was placed on a nonrebreather and transported to the ED with severe sepsis with septic shock.  They determined that his suprapubic catheter was blocked and it was replaced and subsequently a liter of sediment contaminated fluid was eliminated.  His lactic acid was markedly elevated up to 5.2.  He was started on broad-spectrum antibiotic coverage and sepsis fluid boluses given.  His chest x-ray showed no active disease.  His urinalysis was significant for findings of infection.  CT brain did not show any acute findings.  The patient was noted to be significantly somnolent.  His blood pressures  were notably soft.  Patient is being admitted to the stepdown ICU for treatment of severe sepsis with septic shock.    PT Comments  Pt tolerated today's treatment session, no obvious communication of extreme pain. Today's session addressed sitting balance and increased participation therapeutic bed mobility. Pt noted with small traceable contractions hip abductors and shoulder movement during bed mobility. Continues with heavy assist due to muscle weakness and contractures. . Pt would continue to benefit from skilled acute physical therapy services in order to maintain current functional level and reduce risk of pressure injuries.   If plan is discharge home, recommend the following: A lot of help with bathing/dressing/bathroom;A lot of help with walking and/or transfers;Help with stairs or ramp for entrance;Assistance with cooking/housework   Can travel by private vehicle     No  Equipment Recommendations  None recommended by PT    Recommendations for Other Services       Precautions / Restrictions Precautions Precautions: Fall Restrictions Weight Bearing Restrictions: No     Mobility  Bed Mobility Overal bed mobility: Needs Assistance Bed Mobility: Supine to Sit, Sit to Supine     Supine to sit: Max assist Sit to supine: Max assist   General bed mobility comments: max assist for supine to sit and back to supine with time given to respond to verbal cues, minimal muscle activation noted, observed small amount so fhip abduction when cued. Pt requiring handoverhand follow through for bed mobility. Superior placed in bed at max assist with trendeleberg position assist. All appropriate pillows paced in oirginal position for max comfort and pressure sore reductin.    Transfers  Ambulation/Gait                   Stairs             Wheelchair Mobility     Tilt Bed    Modified Rankin (Stroke Patients Only)       Balance  Overall balance assessment: Needs assistance Sitting-balance support: No upper extremity supported Sitting balance-Leahy Scale: Poor Sitting balance - Comments: seated at EOB, lateral waying, able to hold weight when utilizing LUE but continues to need support.                                    Cognition Arousal: Alert Behavior During Therapy: Flat affect, Impulsive, Restless Overall Cognitive Status: History of cognitive impairments - at baseline                                 General Comments: Patient mostly non-verbal, requires Max tactile/verbal cueing for participation        Exercises Other Exercises Other Exercises: Attemptd knee exxtensio nthrough LLE. observed trace contractions of L quadiceps. Other Exercises: Primary sitting balance for postural control. Mod to max assist is primary level given. Pt able to sit independently for 3-5 seconds but started swaying to R side. -15 minutes    General Comments        Pertinent Vitals/Pain Pain Assessment Faces Pain Scale: Hurts a little bit    Home Living                          Prior Function            PT Goals (current goals can now be found in the care plan section) Acute Rehab PT Goals Patient Stated Goal: not stated PT Goal Formulation: With patient/family Time For Goal Achievement: 12/16/22 Potential to Achieve Goals: Fair    Frequency    Min 2X/week      PT Plan      Co-evaluation              AM-PAC PT "6 Clicks" Mobility   Outcome Measure  Help needed turning from your back to your side while in a flat bed without using bedrails?: A Lot Help needed moving from lying on your back to sitting on the side of a flat bed without using bedrails?: A Lot Help needed moving to and from a bed to a chair (including a wheelchair)?: Total Help needed standing up from a chair using your arms (e.g., wheelchair or bedside chair)?: Total Help needed to walk in  hospital room?: Total Help needed climbing 3-5 steps with a railing? : Total 6 Click Score: 8    End of Session Equipment Utilized During Treatment: Oxygen Activity Tolerance: Patient tolerated treatment well;Patient limited by fatigue Patient left: in bed;with call bell/phone within reach;with family/visitor present Nurse Communication: Mobility status PT Visit Diagnosis: Unsteadiness on feet (R26.81);Other abnormalities of gait and mobility (R26.89);Muscle weakness (generalized) (M62.81)     Time: 1115-1140 PT Time Calculation (min) (ACUTE ONLY): 25 min  Charges:    $Therapeutic Activity: 8-22 mins $Neuromuscular Re-education: 8-22 mins PT General Charges $$ ACUTE PT VISIT: 1 Visit                     Nelida Meuse PT, DPT Physical  Therapist with [facility] [phone] office   Nelida Meuse 12/05/2022, 11:56 AM

## 2022-12-05 NOTE — Progress Notes (Signed)
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  He has a dysphagia, dysphagia, chronic contractures in the lower extremities, history of bedsore, cognitive communication deficit, chronic neurogenic bladder with indwelling suprapubic catheter, hypertension, GERD, history of A-fib, history of second-degree AV block status post pacemaker in 2018, stage III CKD, right hemiplegia, generalized muscle weakness, history of gastrostomy subsequently removed, abnormal posture, bedbound status, OSA on nightly BiPAP, long-term care resident in [place].  His wife reports that she visits him daily at the facility.  She noticed yesterday that he was sleeping most of the day.  She thought it was due to the higher dose of melatonin that had been given to him over the last couple of days to help him sleep better and tolerate his BiPAP better.  When she came to visit him today she found him to look extremely unwell.  He had a fever.  He had respiratory distress.  He was pale and listless and noncommunicative.  EMS was called and he was placed on a nonrebreather and transported to the ED with severe sepsis with septic shock.  They determined that his suprapubic catheter was blocked and it was replaced and subsequently a liter of sediment contaminated fluid was eliminated.  His lactic acid was markedly elevated up to 5.2.  He was started on broad-spectrum antibiotic coverage and sepsis fluid boluses given.  His chest x-ray showed no active disease.  His urinalysis was significant for findings of infection.  CT brain did not show any acute findings.  The patient was noted to be significantly somnolent.  His blood pressures were notably soft.  Patient is being  admitted to the stepdown ICU for treatment of severe sepsis with septic shock.   Assessment/Plan: Severe Sepsis with septic shock  - secondary to urinary tract infection  - follow up urine culture - gram negative rods seen in preliminary report  - continue broad spectrum antibiotic coverage with IV cefepime dosed by pharm D - de-escalate antibiotics based on urine culture and sensitivity findings - sepsis orderset bundle fluid hydration ordered - trend lactate, down to 2.1 - continue aggressive supportive measures - admit to stepdown ICU - he is now OFF IV norepinephrine infusion for BP support - sepsis physiology resolved   Acute respiratory failure with hypoxia  - he initially required a nonrebreather -due to aspiration pneumonitis - wean down to nasal cannula--now on 2L - continue nightly bipap therapy which is supposed to do at longterm care but not fully compliant per wife   Aspiration pneumonitis -had another aspiration event on evening 12/03/22 but pt was not sitting up to eat -continue cefepime -12/04/22--repeat speech eval>>dys 2 with NTL -CT chest--extensive patchy consolidation and GGO in RLL, RML, RUL -PCT 3.81   Lactic Acidosis - severe - peaked at 5.2, now down to 2.1 which is reassuring of positive response to therapy    AKI on CKD stage 3b  - related to bladder outlet obstruction which is now relieved and severe dehydration and sepsis - treating with IV fluids  - hold lisinopril  - renally dose meds as able  - baseline creatinine 1.3-1.5 - serum creatinine peaked 5.42  Hypernatremia -hold bicarb -start D5W   Acute urinary retention  - relieved after  changing out suprapubic catheter in ED by Dr. Criss Alvine on 11/10 - urine appears to be flowing freely at this time - follow up with Dr. Retta Diones - his urologist     Dysphagia/Dysarthria - chronic  - these are late effects from his devastating intracerebral hemorrage in 2022 - wife reports that he is on  dysphagia 3 diet with nectar thickened liquids - 12/04/22--speech therapy eval>dys 2 with    Leukocytosis - RESOLVED  - secondary to UTI with sepsis - WBC trended down to 9.5 with aggressive treatment    Hypercalcemia - Treated and RESOLVED  - Hydrated with IV normal saline as first line therapy - calcium down to 9.0    Paroxysmal atrial fibrillation  - he is no longer anticoagulated due to intracerebral hemorrhage - resumed metoprolol succinate but reduced dose by 50% due to soft BPs - follow on cardiac monitor    OSA - he is cared for by sleep specialist Dr. Vassie Loll - he is currently on nightly bipap but his compliance has been poor per wife - we have reordered nightly bipap for inpatient care   Type 2 diabetes mellitus  -12/01/22 A1C--5.5 -d/c sliding scale   Bilateral renal masses  - pt is followed by Dr. Ellin Saba for annual CT surveillance - wife reports pt had an ablation done on his right kidney years ago   GERD - famotidine ordered for GI protection    Goals of care - given severe co-morbidities and frailty would benefit from goals of care discussion, consulted palliative medicine team -remains full code               Family Communication:  no Family at bedside   Consultants:  palliative   Code Status:  FULL   DVT Prophylaxis:  SCDs     Procedures: As Listed in Progress Note Above   Antibiotics: Cefepime 11/10>>           Subjective: Pt more alert and interactive.  Denies cp, sob.  Remainder ROS limited due to cognitive impairment.  No vomiting or diarrhea  Objective: Vitals:   12/04/22 2225 12/05/22 0524 12/05/22 0906 12/05/22 1241  BP:  132/76 (!) 142/87 138/84  Pulse: 86 90 88 86  Resp: 17 20 20 20   Temp:  98.4 F (36.9 C) (!) 97.4 F (36.3 C) 99.1 F (37.3 C)  TempSrc:   Oral Oral  SpO2: 96% 97% 98% 96%  Weight:  72 kg    Height:        Intake/Output Summary (Last 24 hours) at 12/05/2022 1750 Last data filed at  12/05/2022 1240 Gross per 24 hour  Intake 1116.25 ml  Output 1900 ml  Net -783.75 ml   Weight change: -0.2 kg Exam:  General:  Pt is alert, follows commands appropriately, not in acute distress HEENT: No icterus, No thrush, No neck mass, [place]/AT Cardiovascular: RRR, S1/S2, no rubs, no gallops Respiratory: bilateral rales R>L.  No wheeze Abdomen: Soft/+BS, non tender, non distended, no guarding Extremities: No edema, No lymphangitis, No petechiae, No rashes, no synovitis   Data Reviewed: I have personally reviewed following labs and imaging studies Basic Metabolic Panel: Recent Labs  Lab 12/01/22 1127 12/02/22 0343 12/03/22 0459 12/04/22 0410 12/05/22 0413  NA 137 137 143 144 149*  149*  K 4.8 4.0 3.5 3.5 3.7  3.7  CL 100 108 113* 119* 113*  114*  CO2 19* 18* 21* 22 23  24   GLUCOSE 161* 88 104* 116* 108*  109*  BUN 74* 80* 66* 52* 44*  43*  CREATININE 5.42* 4.51* 3.13* 2.44* 2.04*  2.04*  CALCIUM 10.4* 9.0 9.3 8.9 9.2  9.2  MG  --  1.8 2.2 2.1 1.9  PHOS  --  3.1 2.4* 2.7 2.6   Liver Function Tests: Recent Labs  Lab 12/01/22 1127 12/02/22 0343 12/03/22 0459 12/04/22 0410 12/05/22 0413  AST 22  --   --   --   --   ALT 15  --   --   --   --   ALKPHOS 120  --   --   --   --   BILITOT 0.9  --   --   --   --   PROT 8.8*  --   --   --   --   ALBUMIN 3.6 2.4* 2.4* 2.4* 2.4*   No results for input(s): "LIPASE", "AMYLASE" in the last 168 hours. No results for input(s): "AMMONIA" in the last 168 hours. Coagulation Profile: Recent Labs  Lab 12/01/22 1127  INR 1.3*   CBC: Recent Labs  Lab 12/01/22 1127 12/02/22 0343 12/03/22 0459 12/05/22 0413  WBC 21.2* 8.6 9.5 11.2*  NEUTROABS 17.8* 6.9 7.4  --   HGB 15.3 10.7* 10.8* 10.7*  HCT 46.6 33.6* 34.3* 34.0*  MCV 89.1 89.6 90.0 91.9  PLT 398 198 219 251   Cardiac Enzymes: No results for input(s): "CKTOTAL", "CKMB", "CKMBINDEX", "TROPONINI" in the last 168 hours. BNP: Invalid input(s):  "POCBNP" CBG: Recent Labs  Lab 12/04/22 2106 12/05/22 0224 12/05/22 0741 12/05/22 1152 12/05/22 1655  GLUCAP 151* 109* 109* 155* 141*   HbA1C: No results for input(s): "HGBA1C" in the last 72 hours. Urine analysis:    Component Value Date/Time   COLORURINE YELLOW 12/01/2022 1413   APPEARANCEUR TURBID (A) 12/01/2022 1413   APPEARANCEUR Clear 06/29/2021 1121   LABSPEC 1.011 12/01/2022 1413   PHURINE 8.0 12/01/2022 1413   GLUCOSEU NEGATIVE 12/01/2022 1413   HGBUR SMALL (A) 12/01/2022 1413   BILIRUBINUR NEGATIVE 12/01/2022 1413   BILIRUBINUR Negative 06/29/2021 1121   KETONESUR NEGATIVE 12/01/2022 1413   PROTEINUR >=300 (A) 12/01/2022 1413   NITRITE NEGATIVE 12/01/2022 1413   LEUKOCYTESUR LARGE (A) 12/01/2022 1413   Sepsis Labs: @LABRCNTIP (procalcitonin:4,lacticidven:4) ) Recent Results (from the past 240 hour(s))  Blood Culture (routine x 2)     Status: None (Preliminary result)   Collection Time: 12/01/22 11:27 AM   Specimen: Right Antecubital; Blood  Result Value Ref Range Status   Specimen Description   Final    RIGHT ANTECUBITAL BOTTLES DRAWN AEROBIC AND ANAEROBIC Performed at [facility], [address] [place], [place] [address]    Special Requests   Final    Blood Culture adequate volume Performed at [facility], [address] [place], [place] [address]    Culture   Final    NO GROWTH 4 DAYS Performed at [facility], [address], [place], [place] [address]    Report Status PENDING  Incomplete  Blood Culture (routine x 2)     Status: None (Preliminary result)   Collection Time: 12/01/22 11:55 AM   Specimen: BLOOD LEFT HAND  Result Value Ref Range Status   Specimen Description   Final    BLOOD LEFT HAND AEROBIC BOTTLE ONLY Performed at [facility], [address] [place], [place] [address]    Special Requests   Final    Blood Culture results may not be optimal due to an inadequate volume of blood received  in culture bottles Performed at  [facility], [address] [place], [place] [address]    Culture   Final    NO GROWTH 4 DAYS Performed at [facility], [address] [place], [place] [address]    Report Status PENDING  Incomplete  Resp panel by RT-PCR (RSV, Flu A&B, Covid) Anterior Nasal Swab     Status: None   Collection Time: 12/01/22  2:13 PM   Specimen: Anterior Nasal Swab  Result Value Ref Range Status   SARS Coronavirus 2 by RT PCR NEGATIVE NEGATIVE Final    Comment: (NOTE) SARS-CoV-2 target nucleic acids are NOT DETECTED.  The SARS-CoV-2 RNA is generally detectable in upper respiratory specimens during the acute phase of infection. The lowest concentration of SARS-CoV-2 viral copies this assay can detect is 138 copies/mL. A negative result does not preclude SARS-Cov-2 infection and should not be used as the sole basis for treatment or other patient management decisions. A negative result may occur with  improper specimen collection/handling, submission of specimen other than nasopharyngeal swab, presence of viral mutation(s) within the areas targeted by this assay, and inadequate number of viral copies(<138 copies/mL). A negative result must be combined with clinical observations, patient history, and epidemiological information. The expected result is Negative.  Fact Sheet for Patients:  BloggerCourse.com  Fact Sheet for Healthcare Providers:  SeriousBroker.it  This test is no t yet approved or cleared by the [place] FDA and  has been authorized for detection and/or diagnosis of SARS-CoV-2 by FDA under an Emergency Use Authorization (EUA). This EUA will remain  in effect (meaning this test can be used) for the duration of the COVID-19 declaration under Section 564(b)(1) of the Act, 21 U.S.C.section 360bbb-3(b)(1), unless the authorization is terminated  or revoked sooner.       Influenza A by PCR NEGATIVE NEGATIVE Final   Influenza  B by PCR NEGATIVE NEGATIVE Final    Comment: (NOTE) The Xpert Xpress SARS-CoV-2/FLU/RSV plus assay is intended as an aid in the diagnosis of influenza from Nasopharyngeal swab specimens and should not be used as a sole basis for treatment. Nasal washings and aspirates are unacceptable for Xpert Xpress SARS-CoV-2/FLU/RSV testing.  Fact Sheet for Patients: BloggerCourse.com  Fact Sheet for Healthcare Providers: SeriousBroker.it  This test is not yet approved or cleared by the [place] FDA and has been authorized for detection and/or diagnosis of SARS-CoV-2 by FDA under an Emergency Use Authorization (EUA). This EUA will remain in effect (meaning this test can be used) for the duration of the COVID-19 declaration under Section 564(b)(1) of the Act, 21 U.S.C. section 360bbb-3(b)(1), unless the authorization is terminated or revoked.     Resp Syncytial Virus by PCR NEGATIVE NEGATIVE Final    Comment: (NOTE) Fact Sheet for Patients: BloggerCourse.com  Fact Sheet for Healthcare Providers: SeriousBroker.it  This test is not yet approved or cleared by the [place] FDA and has been authorized for detection and/or diagnosis of SARS-CoV-2 by FDA under an Emergency Use Authorization (EUA). This EUA will remain in effect (meaning this test can be used) for the duration of the COVID-19 declaration under Section 564(b)(1) of the Act, 21 U.S.C. section 360bbb-3(b)(1), unless the authorization is terminated or revoked.  Performed at [facility], [address], [place], [place] [address]   Urine Culture (for pregnant, neutropenic or urologic patients or patients with an indwelling urinary catheter)     Status: Abnormal   Collection Time: 12/01/22  2:13 PM  Specimen: Urine, Suprapubic  Result Value Ref Range Status   Specimen Description   Final    URINE, SUPRAPUBIC Performed at  [facility], [address] [place], [place] [address]    Special Requests   Final    NONE Performed at [facility], [address], [place], [place] [address]    Culture 80,000 COLONIES/mL PROVIDENCIA STUARTII (A)  Final   Report Status 12/03/2022 FINAL  Final   Organism ID, Bacteria PROVIDENCIA STUARTII (A)  Final      Susceptibility   Providencia stuartii - MIC*    AMPICILLIN >=32 RESISTANT Resistant     CEFEPIME <=0.12 SENSITIVE Sensitive     CEFTRIAXONE <=0.25 SENSITIVE Sensitive     CIPROFLOXACIN >=4 RESISTANT Resistant     GENTAMICIN RESISTANT Resistant     IMIPENEM 4 SENSITIVE Sensitive     NITROFURANTOIN 128 RESISTANT Resistant     TRIMETH/SULFA <=20 SENSITIVE Sensitive     AMPICILLIN/SULBACTAM >=32 RESISTANT Resistant     PIP/TAZO >=128 RESISTANT Resistant ug/mL    * 80,000 COLONIES/mL PROVIDENCIA STUARTII  MRSA Next Gen by PCR, Nasal     Status: Abnormal   Collection Time: 12/01/22  9:45 PM   Specimen: Nasal Mucosa; Nasal Swab  Result Value Ref Range Status   MRSA by PCR Next Gen DETECTED (A) NOT DETECTED Final    Comment: RESULT CALLED TO, READ BACK BY AND VERIFIED WITH: ALCIA FAWYER @ 1612 ON 12/02/22 C VARNER (NOTE) The GeneXpert MRSA Assay (FDA approved for NASAL specimens only), is one component of a comprehensive MRSA colonization surveillance program. It is not intended to diagnose MRSA infection nor to guide or monitor treatment for MRSA infections. Test performance is not FDA approved in patients less than 41 years old. Performed at [facility], [address], [place], [place] [address]      Scheduled Meds:  Chlorhexidine Gluconate Cloth  6 each Topical Daily   Chlorhexidine Gluconate Cloth  6 each Topical Q0600   famotidine  20 mg Oral Daily   feeding supplement  237 mL Oral BID BM   FLUoxetine  20 mg Oral Daily   melatonin  6 mg Oral QHS   metoprolol succinate  25 mg Oral BID   multivitamin with minerals  1 tablet Oral Daily   mupirocin ointment   1 Application Nasal BID   Muscle Rub  1 Application Topical BID   mouth rinse  15 mL Mouth Rinse 4 times per day   simvastatin  40 mg Oral q1800   Continuous Infusions:  ceFEPime (MAXIPIME) IV 2 g (12/05/22 1203)   dextrose 75 mL/hr at 12/05/22 1715    Procedures/Studies: CT CHEST WO CONTRAST  Result Date: 12/05/2022 CLINICAL DATA:  Inpatient. Acute respiratory failure. Respiratory illness. Aspiration. History of renal cell carcinoma. * Tracking Code: BO * EXAM: CT CHEST WITHOUT CONTRAST TECHNIQUE: Multidetector CT imaging of the chest was performed following the standard protocol without IV contrast. RADIATION DOSE REDUCTION: This exam was performed according to the departmental dose-optimization program which includes automated exposure control, adjustment of the mA and/or kV according to patient size and/or use of iterative reconstruction technique. COMPARISON:  Chest radiograph from earlier today. 09/07/2021 PET-CT. 08/09/2021 chest CT. FINDINGS: Motion degraded scan, limiting assessment. Cardiovascular: Mild cardiomegaly. Two lead left subclavian pacemaker with lead tips in the right atrium and right ventricle. No significant pericardial effusion/thickening. Three-vessel coronary atherosclerosis. Atherosclerotic nonaneurysmal thoracic aorta. Normal caliber pulmonary arteries. Mediastinum/Nodes: No significant thyroid nodules. Unremarkable esophagus. No pathologically enlarged  axillary, mediastinal or hilar lymph nodes, noting limited sensitivity for the detection of hilar adenopathy on this noncontrast study. Lungs/Pleura: No pneumothorax. Trace posterior right upper pleural effusion. No left pleural effusion. Extensive patchy consolidation and ground-glass opacity throughout the right lower lobe and posterior right upper and right middle lobes, new from 08/09/2021 chest CT. No discrete lung masses or significant pulmonary nodules in the aerated portions of the lungs. Upper abdomen: No acute  abnormality. Musculoskeletal: No aggressive appearing focal osseous lesions. Mild thoracic spondylosis. Inferior approach spinal stimulator terminates in the posterior lower thoracic spinal canal. IMPRESSION: 1. Motion degraded scan, limiting assessment. 2. Extensive patchy consolidation and ground-glass opacity throughout the right lower lobe and posterior right upper and right middle lobes, new from 08/09/2021 chest CT, compatible with multifocal pneumonia. Follow-up chest imaging recommended to document resolution. 3. Trace posterior right upper pleural effusion. 4. Mild cardiomegaly. Three-vessel coronary atherosclerosis. 5.  Aortic Atherosclerosis (ICD10-I70.0). Electronically Signed   By: Delbert Phenix M.D.   On: 12/05/2022 11:55   DG CHEST PORT 1 VIEW  Result Date: 12/04/2022 CLINICAL DATA:  [id number] Aspiration into airway [id number]. EXAM: PORTABLE CHEST 1 VIEW COMPARISON:  Chest radiograph 12/02/2022. FINDINGS: Left chest dual-chamber pacemaker with leads projecting over the right atrium and ventricle. Spinal cord stimulator leads project over the lower thoracic spine. No consolidation or pulmonary edema. Stable cardiac and mediastinal contours. No pleural effusion or pneumothorax. IMPRESSION: No evidence of acute cardiopulmonary disease. Electronically Signed   By: Orvan Falconer M.D.   On: 12/04/2022 11:15   DG CHEST PORT 1 VIEW  Result Date: 12/02/2022 CLINICAL DATA:  [id number] Severe sepsis (HCC) [id number] EXAM: PORTABLE CHEST 1 VIEW COMPARISON:  Chest x-ray 12/01/2022, pet ct 09/07/21 FINDINGS: Left chest wall dual lead pacemaker. Persistent cardiomegaly. The heart and mediastinal contours are unchanged. No focal consolidation. No pulmonary edema. No pleural effusion. No pneumothorax. No acute osseous abnormality. IMPRESSION: No active disease. Electronically Signed   By: Tish Frederickson M.D.   On: 12/02/2022 21:49   DG Chest Port 1 View  Result Date: 12/01/2022 CLINICAL DATA:  Fever, hypoxia.  EXAM: PORTABLE CHEST 1 VIEW COMPARISON:  Chest CT dated 08/17/2021. FINDINGS: The heart is mildly enlarged. Vascular calcifications are seen in the aortic arch. A left subclavian approach cardiac device is redemonstrated. The lungs are clear. Degenerative changes are seen in the spine. IMPRESSION: No active disease. Cardiomegaly. Electronically Signed   By: Romona Curls M.D.   On: 12/01/2022 13:35   CT Head Wo Contrast  Result Date: 12/01/2022 CLINICAL DATA:  Mental status change, unknown cause EXAM: CT HEAD WITHOUT CONTRAST TECHNIQUE: Contiguous axial images were obtained from the base of the skull through the vertex without intravenous contrast. RADIATION DOSE REDUCTION: This exam was performed according to the departmental dose-optimization program which includes automated exposure control, adjustment of the mA and/or kV according to patient size and/or use of iterative reconstruction technique. COMPARISON:  CT head 05/05/21 FINDINGS: Brain: No hemorrhage. No hydrocephalus. No extra-axial fluid collection. No CT evidence of an acute cortical infarct. No mass effect. No mass lesion. 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 Skull: Normal. Negative for fracture or focal lesion. Sinuses/Orbits: No middle ear or mastoid effusion. Paranasal sinuses are clear. Left lens replacement. Orbits are otherwise unremarkable. Other: None. IMPRESSION: 1. No hemorrhage or CT evidence of an acute cortical infarct. 2. Sequela of severe chronic microvascular ischemic change  with chronic infarcts in the bilateral basal ganglia. Electronically Signed   By: Lorenza Cambridge M.D.   On: 12/01/2022 13:04   CUP PACEART REMOTE DEVICE CHECK  Result Date: 11/07/2022 Scheduled remote reviewed. Normal device function.  Next remote 91 days. ML, CVRS   Catarina Hartshorn, DO  [facility]  If 7PM-7AM,  please contact night-coverage [email or web address] Password [email or web address] 12/05/2022, 5:50 PM   LOS: 4 days

## 2022-12-05 NOTE — Progress Notes (Signed)
Palliative: Chart review completed.  No face-to-face visit with Dylan Martin and his wife today as their goals are set for continued full scope/full code.  PMT to shadow for needs/declines.  Plan: At this point continue full scope/full code.  Time for outcomes.  Anticipate return to [facility] under long-term care/rehab where he has been for over 1 year.  Would benefit from outpatient palliative services.  No charge  Lillia Carmel, NP Palliative medicine team Team phone [phone]

## 2022-12-05 NOTE — Plan of Care (Signed)

## 2022-12-06 DIAGNOSIS — N1832 Chronic kidney disease, stage 3b: Secondary | ICD-10-CM

## 2022-12-06 DIAGNOSIS — J9601 Acute respiratory failure with hypoxia: Secondary | ICD-10-CM

## 2022-12-06 DIAGNOSIS — A419 Sepsis, unspecified organism: Secondary | ICD-10-CM

## 2022-12-06 DIAGNOSIS — N179 Acute kidney failure, unspecified: Secondary | ICD-10-CM

## 2022-12-06 DIAGNOSIS — J690 Pneumonitis due to inhalation of food and vomit: Secondary | ICD-10-CM

## 2022-12-06 DIAGNOSIS — N178 Other acute kidney failure: Secondary | ICD-10-CM

## 2022-12-06 DIAGNOSIS — R6521 Severe sepsis with septic shock: Secondary | ICD-10-CM

## 2022-12-06 LAB — CULTURE, BLOOD (ROUTINE X 2)
Culture: NO GROWTH
Culture: NO GROWTH
Special Requests: ADEQUATE

## 2022-12-06 LAB — GLUCOSE, CAPILLARY
Glucose-Capillary: 130 mg/dL — ABNORMAL HIGH (ref 70–99)
Glucose-Capillary: 141 mg/dL — ABNORMAL HIGH (ref 70–99)

## 2022-12-06 LAB — RENAL FUNCTION PANEL
Albumin: 2.3 g/dL — ABNORMAL LOW (ref 3.5–5.0)
Anion gap: 9 (ref 5–15)
BUN: 40 mg/dL — ABNORMAL HIGH (ref 8–23)
CO2: 24 mmol/L (ref 22–32)
Calcium: 9 mg/dL (ref 8.9–10.3)
Chloride: 111 mmol/L (ref 98–111)
Creatinine, Ser: 1.83 mg/dL — ABNORMAL HIGH (ref 0.61–1.24)
GFR, Estimated: 37 mL/min — ABNORMAL LOW (ref >60)
Glucose, Bld: 133 mg/dL — ABNORMAL HIGH (ref 70–99)
Phosphorus: 2.1 mg/dL — ABNORMAL LOW (ref 2.5–4.6)
Potassium: 3.6 mmol/L (ref 3.5–5.1)
Sodium: 144 mmol/L (ref 135–145)

## 2022-12-06 LAB — PHOSPHORUS: Phosphorus: 2.1 mg/dL — ABNORMAL LOW (ref 2.5–4.6)

## 2022-12-06 LAB — MAGNESIUM: Magnesium: 1.7 mg/dL (ref 1.7–2.4)

## 2022-12-06 MED ORDER — CEFADROXIL 500 MG PO CAPS: 500.0000 mg | ORAL_CAPSULE | Freq: Two times a day (BID) | ORAL | Status: DC

## 2022-12-06 MED ORDER — BISACODYL 5 MG PO TBEC: 5.0000 mg | DELAYED_RELEASE_TABLET | Freq: Every day | ORAL | Status: AC | PRN

## 2022-12-06 MED ORDER — ENSURE ENLIVE PO LIQD: 237.0000 mL | Freq: Two times a day (BID) | ORAL | Status: AC

## 2022-12-06 MED ORDER — K PHOS MONO-SOD PHOS DI & MONO 155-852-130 MG PO TABS: 500.0000 mg | ORAL_TABLET | Freq: Two times a day (BID) | ORAL | Status: DC

## 2022-12-06 MED ORDER — METOPROLOL SUCCINATE ER 25 MG PO TB24: 25.0000 mg | ORAL_TABLET | Freq: Two times a day (BID) | ORAL | Status: DC

## 2022-12-06 MED ORDER — K PHOS MONO-SOD PHOS DI & MONO 155-852-130 MG PO TABS
500.0000 mg | ORAL_TABLET | Freq: Two times a day (BID) | ORAL | Status: DC
  Administered 2022-12-06: 500 mg via ORAL
  Filled 2022-12-06: qty 2

## 2022-12-06 MED ORDER — MAGIC MOUTHWASH W/LIDOCAINE
5.0000 mL | Freq: Three times a day (TID) | ORAL | Status: DC
  Filled 2022-12-06 (×7): qty 5

## 2022-12-06 MED ORDER — CEFDINIR 300 MG PO CAPS: 300.0000 mg | ORAL_CAPSULE | Freq: Two times a day (BID) | ORAL | Status: DC

## 2022-12-06 MED ORDER — MAGIC MOUTHWASH W/LIDOCAINE: 5.0000 mL | Freq: Three times a day (TID) | ORAL | Status: DC

## 2022-12-06 MED ORDER — MAGIC MOUTHWASH W/LIDOCAINE
5.0000 mL | Freq: Three times a day (TID) | ORAL | Status: DC
  Filled 2022-12-06 (×10): qty 5

## 2022-12-06 MED ORDER — CEFADROXIL 500 MG PO CAPS
500.0000 mg | ORAL_CAPSULE | Freq: Two times a day (BID) | ORAL | Status: DC
  Filled 2022-12-06 (×4): qty 1

## 2022-12-06 MED ORDER — ADULT MULTIVITAMIN W/MINERALS CH: 1.0000 | ORAL_TABLET | Freq: Every day | ORAL | Status: AC

## 2022-12-06 NOTE — Care Management Important Message (Signed)
Important Message  Patient Details  Name: Dylan Martin MRN: [id number] Date of Birth: [birth year]   Important Message Given:  Yes - Medicare IM     Corey Harold 12/06/2022, 9:44 AM

## 2022-12-06 NOTE — Plan of Care (Signed)
  Problem: Education: Goal: Ability to describe self-care measures that may prevent or decrease complications (Diabetes Survival Skills Education) will improve Outcome: Progressing   Problem: Coping: Goal: Ability to adjust to condition or change in health will improve Outcome: Progressing   Problem: Fluid Volume: Goal: Ability to maintain a balanced intake and output will improve Outcome: Progressing   Problem: Health Behavior/Discharge Planning: Goal: Ability to identify and utilize available resources and services will improve Outcome: Progressing Goal: Ability to manage health-related needs will improve Outcome: Progressing   Problem: Metabolic: Goal: Ability to maintain appropriate glucose levels will improve Outcome: Progressing   Problem: Nutritional: Goal: Maintenance of adequate nutrition will improve Outcome: Progressing Goal: Progress toward achieving an optimal weight will improve Outcome: Progressing   Problem: Skin Integrity: Goal: Risk for impaired skin integrity will decrease Outcome: Progressing   Problem: Tissue Perfusion: Goal: Adequacy of tissue perfusion will improve Outcome: Progressing   Problem: Education: Goal: Knowledge of General Education information will improve Description: Including pain rating scale, medication(s)/side effects and non-pharmacologic comfort measures Outcome: Progressing   Problem: Health Behavior/Discharge Planning: Goal: Ability to manage health-related needs will improve Outcome: Progressing   Problem: Clinical Measurements: Goal: Ability to maintain clinical measurements within normal limits will improve Outcome: Progressing Goal: Will remain free from infection Outcome: Progressing Goal: Diagnostic test results will improve Outcome: Progressing Goal: Respiratory complications will improve Outcome: Progressing Goal: Cardiovascular complication will be avoided Outcome: Progressing   Problem: Nutrition: Goal:  Adequate nutrition will be maintained Outcome: Progressing   Problem: Coping: Goal: Level of anxiety will decrease Outcome: Progressing   Problem: Pain Management: Goal: General experience of comfort will improve Outcome: Progressing   Problem: Safety: Goal: Ability to remain free from injury will improve Outcome: Progressing   Problem: Skin Integrity: Goal: Risk for impaired skin integrity will decrease Outcome: Progressing

## 2022-12-06 NOTE — Progress Notes (Signed)
1610 Patient asleep SpO2 93%. His wife requested that when he naps that he be placed on his BiPAP, due to his sleep apnea.

## 2022-12-06 NOTE — Plan of Care (Signed)

## 2022-12-06 NOTE — Consult Note (Signed)
[facility] Liaison Note  12/06/2022  Dylan Martin 04-21-1945 [id number]  Location: RN Hospital Liaison screened the patient remotely at [facility].  Insurance: [facility]   Dylan Martin is a 77 y.o. male who is a Primary Care Patient of Gilmore Laroche, FNP- [facility]. The patient was screened for readmission hospitalization with noted medium risk score for unplanned readmission risk with 1 IP/1 ED in 6 months.  The patient was assessed for potential Care Management service needs for post hospital transition for care coordination. Review of patient's electronic medical record reveals patient was admitted with Sever sepsis shock. Pt will discharge to SNF level of care. Facility will continue to address pt's ongoing needs.    [facility] Care Management/Population Health does not replace or interfere with any arrangements made by the Inpatient Transition of Care team.   For questions contact:   Elliot Cousin, RN, [facility] Liaison [place]   [facility], Population Health Office Hours MTWF  8:00 am-6:00 pm Direct Dial: [phone] mobile [phone] [Office toll free line] Office Hours are M-F 8:30 - 5 pm [email or web address]

## 2022-12-06 NOTE — TOC Transition Note (Signed)
Transition of Care [facility]) - CM/SW Discharge Note   Patient Details  Name: Dylan Martin MRN: [id number] Date of Birth: 1945-07-15  [facility] CM/SW Contact:  Villa Herb, LCSWA Phone Number: 12/06/2022, 1:39 PM   Clinical Narrative:    CSW updated that pt is medically stable for D/C to [facility]. CSW spoke to [place] with SNF who states they can accept pt today. CSW sent clinicals to facility via HUB. CSW updated pts spouse of D/C. Rn provided with room and report numbers. Med necessity sent to floor for RN. EMS to be called when RN is ready. TOC signing off.   Final next level of care: Long Term Nursing Home Barriers to Discharge: Continued Medical Work up   Patient Goals and CMS Choice CMS Medicare.gov Compare Post Acute Care list provided to:: Patient Represenative (must comment) Choice offered to / list presented to : Spouse  Discharge Placement                  Patient to be transferred to facility by: EMS Name of family member notified: wife Patient and family notified of of transfer: 12/06/22  Discharge Plan and Services Additional resources added to the After Visit Summary for   In-house Referral: Clinical Social Work Discharge Planning Services: CM Consult Post Acute Care Choice: Skilled Nursing Facility                               Social Determinants of Health (SDOH) Interventions SDOH Screenings   Food Insecurity: Patient Unable To Answer (12/01/2022)  Housing: Patient Declined (12/01/2022)  Transportation Needs: No Transportation Needs (12/01/2022)  Utilities: Not At Risk (12/01/2022)  Depression (PHQ2-9): Low Risk  (07/19/2021)  Social Connections: Unknown (03/19/2022)   [facility] from [facility]  Tobacco Use: Low Risk  (12/02/2022)     Readmission Risk Interventions    12/02/2022    2:50 PM  Readmission Risk Prevention Plan  Transportation Screening Complete  Home Care Screening Complete  Medication  Review (RN CM) Complete

## 2022-12-06 NOTE — Discharge Summary (Addendum)
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  He has a dysphagia, dysphagia, chronic contractures in the lower extremities, history of bedsore, cognitive communication deficit, chronic neurogenic bladder with indwelling suprapubic catheter, hypertension, GERD, history of A-fib, history of second-degree AV block status post pacemaker in 2018, stage III CKD, right hemiplegia, generalized muscle weakness, history of gastrostomy subsequently removed, abnormal posture, bedbound status, OSA on nightly BiPAP, long-term care resident in [place].  His wife reports that she visits him daily at the facility.  She noticed yesterday that he was sleeping most of the day.  She thought it was due to the higher dose of melatonin that had been given to him over the last couple of days to help him sleep better and tolerate his BiPAP better.  When she came to visit him today she found him to look extremely unwell.  He had a fever.  He had respiratory distress.  He was pale and listless and noncommunicative.  EMS was called and he was placed on a nonrebreather and transported to the ED with severe sepsis with septic shock.  They determined that his suprapubic catheter was blocked and it was replaced and subsequently a liter of sediment contaminated fluid was eliminated.  His lactic acid was markedly elevated up to 5.2.  He was started on broad-spectrum  antibiotic coverage and sepsis fluid boluses given.  His chest x-ray showed no active disease.  His urinalysis was significant for findings of infection.  CT brain did not show any acute findings.  The patient was noted to be significantly somnolent.  His blood pressures were notably soft.  Patient is being admitted to the stepdown ICU for treatment of severe sepsis with septic shock.  Assessment and Plan: Severe Sepsis with septic shock  - secondary to urinary tract infection  - follow up urine culture - gram negative rods seen in preliminary report  - continue broad spectrum antibiotic coverage with IV cefepime dosed by pharm D - de-escalate antibiotics based on urine culture and sensitivity findings - sepsis orderset bundle fluid hydration ordered - trend lactate, down to 2.1 - continue aggressive supportive measures - admit to stepdown ICU - he is now OFF IV norepinephrine infusion for BP support - sepsis physiology resolved -received 5 days IV cefepime>>-d/c with 2 more days cefadroxil   Acute respiratory failure with hypoxia  - he initially required a nonrebreather -due to aspiration pneumonitis - wean down to nasal cannula--now on 2L>>weaned to RA - continue nightly bipap therapy which is supposed to do at longterm care but not fully compliant per wife   Aspiration pneumonitis -had another aspiration event on evening 12/03/22 but pt was not sitting up to eat -continue cefepime--received 5 days -12/04/22--repeat speech eval>>dys 2 with NTL -CT chest--extensive patchy consolidation and GGO in RLL, RML, RUL -PCT 3.81 -d/c with 2 more days cefadroxil   Lactic Acidosis - severe - peaked at 5.2, now down to 2.1 which  is reassuring of positive response to therapy    AKI on CKD stage 3b  - related to bladder outlet obstruction which is now relieved and severe dehydration and sepsis - treating with IV fluids  - hold lisinopril  - renally dose meds as able  - baseline creatinine  1.3-1.5 - serum creatinine peaked 5.42 -serum creatinine 1.83 on day of d/c   Hypernatremia -hold bicarb>>will not restart -start D5W>>improved -repeat BMP in one week after dc    Acute urinary retention  - relieved after changing out suprapubic catheter in ED by Dr. Criss Alvine on 11/10 - urine appears to be flowing freely at this time - follow up with Dr. Retta Diones - his urologist     Dysphagia/Dysarthria - chronic  - these are late effects from his devastating intracerebral hemorrage in 2022 - wife reports that he is on dysphagia 3 diet with nectar thickened liquids - 12/04/22--speech therapy eval>dys 2 with    Leukocytosis - RESOLVED  - secondary to UTI with sepsis - WBC trended down to 9.5 with aggressive treatment    Hypercalcemia - Treated and RESOLVED  - Hydrated with IV normal saline as first line therapy - calcium down to 9.0    Paroxysmal atrial fibrillation  - he is no longer anticoagulated due to intracerebral hemorrhage - resumed metoprolol succinate but reduced dose by 50% due to soft BPs -Remains rate controlled   OSA - he is cared for by sleep specialist Dr. Vassie Loll - he is currently on nightly bipap but his compliance has been poor per wife - we have reordered nightly bipap for inpatient care   Type 2 diabetes mellitus  -12/01/22 A1C--5.5 -d/c sliding scale   Bilateral renal masses  - pt is followed by Dr. Ellin Saba for annual CT surveillance - wife reports pt had an ablation done on his right kidney years ago   GERD - famotidine ordered for GI protection    Goals of care - given severe co-morbidities and frailty would benefit from goals of care discussion, consulted palliative medicine team -remains full code       Consultants: palliative Procedures performed: none  Disposition: Skilled nursing facility Diet recommendation:  Dysphagia 2 with nectar thickened liquids DISCHARGE MEDICATION: Allergies as of 12/06/2022       Reactions   Asa  [aspirin] Other (See Comments)   Hx of aneurysm, told to avoid by MD   Other Other (See Comments)   Blood thinners- wife wants documented that patient should not be on blood thinners due to past medical history (Hx of aneurysm) Red meat - causes gout    Shrimp (diagnostic) Other (See Comments)   Gout flare        Medication List     STOP taking these medications    amLODipine 10 MG tablet Commonly known as: NORVASC   hydrALAZINE 25 MG tablet Commonly known as: APRESOLINE   lisinopril 40 MG tablet Commonly known as: ZESTRIL   loperamide 2 MG tablet Commonly known as: IMODIUM A-D   sodium bicarbonate 650 MG tablet       TAKE these medications    acetaminophen 325 MG tablet Commonly known as: TYLENOL Take 650 mg by mouth every 4 (four) hours as needed for mild pain or fever.   AMBULATORY NON FORMULARY MEDICATION 1 Device by Does not apply route at bedtime. BiPap   ascorbic acid 500 MG tablet Commonly known as: VITAMIN C Take 500 mg by mouth daily.   Baclofen 5 MG Tabs Take  5 mg by mouth 3 (three) times daily.   bisacodyl 10 MG suppository Commonly known as: DULCOLAX Place 10 mg rectally daily as needed (constipation). What changed: Another medication with the same name was added. Make sure you understand how and when to take each.   bisacodyl 5 MG EC tablet Commonly known as: DULCOLAX Take 1 tablet (5 mg total) by mouth daily as needed for moderate constipation. What changed: You were already taking a medication with the same name, and this prescription was added. Make sure you understand how and when to take each.   cefdinir 300 MG capsule Commonly known as: OMNICEF Take 1 capsule (300 mg total) by mouth every 12 (twelve) hours. Start taking on: December 07, 2022   famotidine 20 MG tablet Commonly known as: PEPCID Take 20 mg by mouth daily.   feeding supplement Liqd Take 237 mLs by mouth 2 (two) times daily between meals. What changed:  how much to  take when to take this additional instructions   fexofenadine 180 MG tablet Commonly known as: ALLEGRA Take 180 mg by mouth daily as needed for allergies or rhinitis.   FLUoxetine 20 MG capsule Commonly known as: PROZAC Take 20 mg by mouth daily.   magic mouthwash w/lidocaine Soln Take 5 mLs by mouth 3 (three) times daily. Suspension contains equal amounts of Maalox Extra Strength, nystatin, diphenhydramine and lidocaine x 3 days   melatonin 5 MG Tabs Take 5 mg by mouth at bedtime.   Menthol (Topical Analgesic) 4 % Gel Apply 1 application  topically 2 (two) times daily. To both legs   metoprolol succinate 25 MG 24 hr tablet Commonly known as: TOPROL-XL Take 1 tablet (25 mg total) by mouth 2 (two) times daily. What changed:  medication strength how much to take   multivitamin with minerals Tabs tablet Take 1 tablet by mouth daily.   nitroGLYCERIN 0.4 MG SL tablet Commonly known as: NITROSTAT Place 0.4 mg under the tongue every 5 (five) minutes as needed for chest pain.   NON FORMULARY Take 1 Container by mouth 2 (two) times daily. Magic Cups   ondansetron 4 MG tablet Commonly known as: ZOFRAN Take 4 mg by mouth every 12 (twelve) hours as needed for vomiting or nausea.   phosphorus 155-852-130 MG tablet Commonly known as: K PHOS NEUTRAL Take 2 tablets (500 mg total) by mouth 2 (two) times daily. X 4 doses   simvastatin 40 MG tablet Commonly known as: ZOCOR Take 40 mg by mouth daily at 6 PM.   thiamine 100 MG tablet Commonly known as: VITAMIN B1 Take 100 mg by mouth daily.        Discharge Exam: Filed Weights   12/04/22 0455 12/05/22 0524 12/06/22 0549  Weight: 72.2 kg 72 kg 71.4 kg   HEENT:  [place]/AT, No thrush, no icterus CV:  RRR, no rub, no S3, no S4 Lung:  bibasilar rales.  No wheeze Abd:  soft/+BS, NT Ext:  No edema, no lymphangitis, no synovitis, no rash   Condition at discharge: stable  The results of significant diagnostics from this  hospitalization (including imaging, microbiology, ancillary and laboratory) are listed below for reference.   Imaging Studies: CT CHEST WO CONTRAST  Result Date: 12/05/2022 CLINICAL DATA:  Inpatient. Acute respiratory failure. Respiratory illness. Aspiration. History of renal cell carcinoma. * Tracking Code: BO * EXAM: CT CHEST WITHOUT CONTRAST TECHNIQUE: Multidetector CT imaging of the chest was performed following the standard protocol without IV contrast. RADIATION DOSE REDUCTION: This exam  was performed according to the departmental dose-optimization program which includes automated exposure control, adjustment of the mA and/or kV according to patient size and/or use of iterative reconstruction technique. COMPARISON:  Chest radiograph from earlier today. 09/07/2021 PET-CT. 08/09/2021 chest CT. FINDINGS: Motion degraded scan, limiting assessment. Cardiovascular: Mild cardiomegaly. Two lead left subclavian pacemaker with lead tips in the right atrium and right ventricle. No significant pericardial effusion/thickening. Three-vessel coronary atherosclerosis. Atherosclerotic nonaneurysmal thoracic aorta. Normal caliber pulmonary arteries. Mediastinum/Nodes: No significant thyroid nodules. Unremarkable esophagus. No pathologically enlarged axillary, mediastinal or hilar lymph nodes, noting limited sensitivity for the detection of hilar adenopathy on this noncontrast study. Lungs/Pleura: No pneumothorax. Trace posterior right upper pleural effusion. No left pleural effusion. Extensive patchy consolidation and ground-glass opacity throughout the right lower lobe and posterior right upper and right middle lobes, new from 08/09/2021 chest CT. No discrete lung masses or significant pulmonary nodules in the aerated portions of the lungs. Upper abdomen: No acute abnormality. Musculoskeletal: No aggressive appearing focal osseous lesions. Mild thoracic spondylosis. Inferior approach spinal stimulator terminates in the  posterior lower thoracic spinal canal. IMPRESSION: 1. Motion degraded scan, limiting assessment. 2. Extensive patchy consolidation and ground-glass opacity throughout the right lower lobe and posterior right upper and right middle lobes, new from 08/09/2021 chest CT, compatible with multifocal pneumonia. Follow-up chest imaging recommended to document resolution. 3. Trace posterior right upper pleural effusion. 4. Mild cardiomegaly. Three-vessel coronary atherosclerosis. 5.  Aortic Atherosclerosis (ICD10-I70.0). Electronically Signed   By: Delbert Phenix M.D.   On: 12/05/2022 11:55   DG CHEST PORT 1 VIEW  Result Date: 12/04/2022 CLINICAL DATA:  [id number] Aspiration into airway [id number]. EXAM: PORTABLE CHEST 1 VIEW COMPARISON:  Chest radiograph 12/02/2022. FINDINGS: Left chest dual-chamber pacemaker with leads projecting over the right atrium and ventricle. Spinal cord stimulator leads project over the lower thoracic spine. No consolidation or pulmonary edema. Stable cardiac and mediastinal contours. No pleural effusion or pneumothorax. IMPRESSION: No evidence of acute cardiopulmonary disease. Electronically Signed   By: Orvan Falconer M.D.   On: 12/04/2022 11:15   DG CHEST PORT 1 VIEW  Result Date: 12/02/2022 CLINICAL DATA:  [id number] Severe sepsis (HCC) [id number] EXAM: PORTABLE CHEST 1 VIEW COMPARISON:  Chest x-ray 12/01/2022, pet ct 09/07/21 FINDINGS: Left chest wall dual lead pacemaker. Persistent cardiomegaly. The heart and mediastinal contours are unchanged. No focal consolidation. No pulmonary edema. No pleural effusion. No pneumothorax. No acute osseous abnormality. IMPRESSION: No active disease. Electronically Signed   By: Tish Frederickson M.D.   On: 12/02/2022 21:49   DG Chest Port 1 View  Result Date: 12/01/2022 CLINICAL DATA:  Fever, hypoxia. EXAM: PORTABLE CHEST 1 VIEW COMPARISON:  Chest CT dated 08/17/2021. FINDINGS: The heart is mildly enlarged. Vascular calcifications are seen in the aortic arch.  A left subclavian approach cardiac device is redemonstrated. The lungs are clear. Degenerative changes are seen in the spine. IMPRESSION: No active disease. Cardiomegaly. Electronically Signed   By: Romona Curls M.D.   On: 12/01/2022 13:35   CT Head Wo Contrast  Result Date: 12/01/2022 CLINICAL DATA:  Mental status change, unknown cause EXAM: CT HEAD WITHOUT CONTRAST TECHNIQUE: Contiguous axial images were obtained from the base of the skull through the vertex without intravenous contrast. RADIATION DOSE REDUCTION: This exam was performed according to the departmental dose-optimization program which includes automated exposure control, adjustment of the mA and/or kV according to patient size and/or use of iterative reconstruction technique. COMPARISON:  CT head 05/05/21 FINDINGS: Brain: No hemorrhage. No hydrocephalus.  No extra-axial fluid collection. No CT evidence of an acute cortical infarct. No mass effect. No mass lesion. 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 Skull: Normal. Negative for fracture or focal lesion. Sinuses/Orbits: No middle ear or mastoid effusion. Paranasal sinuses are clear. Left lens replacement. Orbits are otherwise unremarkable. Other: None. IMPRESSION: 1. No hemorrhage or CT evidence of an acute cortical infarct. 2. 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 Electronically Signed   By: Lorenza Cambridge M.D.   On: 12/01/2022 13:04   CUP PACEART REMOTE DEVICE CHECK  Result Date: 11/07/2022 Scheduled remote reviewed. Normal device function.  Next remote 91 days. ML, CVRS   Microbiology: Results for orders placed or performed during the hospital encounter of 12/01/22  Blood Culture (routine x 2)     Status: None   Collection Time: 12/01/22 11:27 AM   Specimen: Right Antecubital;  Blood  Result Value Ref Range Status   Specimen Description   Final    RIGHT ANTECUBITAL BOTTLES DRAWN AEROBIC AND ANAEROBIC   Special Requests Blood Culture adequate volume  Final   Culture   Final    NO GROWTH 5 DAYS Performed at [facility], [address], [place], [place] [address]    Report Status 12/06/2022 FINAL  Final  Blood Culture (routine x 2)     Status: None   Collection Time: 12/01/22 11:55 AM   Specimen: BLOOD LEFT HAND  Result Value Ref Range Status   Specimen Description BLOOD LEFT HAND AEROBIC BOTTLE ONLY  Final   Special Requests   Final    Blood Culture results may not be optimal due to an inadequate volume of blood received in culture bottles   Culture   Final    NO GROWTH 5 DAYS Performed at [facility], [address] [place], [place] [address]    Report Status 12/06/2022 FINAL  Final  Resp panel by RT-PCR (RSV, Flu A&B, Covid) Anterior Nasal Swab     Status: None   Collection Time: 12/01/22  2:13 PM   Specimen: Anterior Nasal Swab  Result Value Ref Range Status   SARS Coronavirus 2 by RT PCR NEGATIVE NEGATIVE Final    Comment: (NOTE) SARS-CoV-2 target nucleic acids are NOT DETECTED.  The SARS-CoV-2 RNA is generally detectable in upper respiratory specimens during the acute phase of infection. The lowest concentration of SARS-CoV-2 viral copies this assay can detect is 138 copies/mL. A negative result does not preclude SARS-Cov-2 infection and should not be used as the sole basis for treatment or other patient management decisions. A negative result may occur with  improper specimen collection/handling, submission of specimen other than nasopharyngeal swab, presence of viral mutation(s) within the areas targeted by this assay, and inadequate number of viral copies(<138 copies/mL). A negative result must be combined with clinical observations, patient history, and epidemiological information. The expected result is Negative.  Fact Sheet for Patients:   BloggerCourse.com  Fact Sheet for Healthcare Providers:  SeriousBroker.it  This test is no t yet approved or cleared by the [place] FDA and  has been authorized for detection and/or diagnosis of SARS-CoV-2 by FDA under an Emergency Use Authorization (EUA). This EUA will remain  in effect (meaning this test can be used) for the duration of the COVID-19 declaration under Section 564(b)(1) of the Act, 21 U.S.C.section 360bbb-3(b)(1), unless the authorization is  terminated  or revoked sooner.       Influenza A by PCR NEGATIVE NEGATIVE Final   Influenza B by PCR NEGATIVE NEGATIVE Final    Comment: (NOTE) The Xpert Xpress SARS-CoV-2/FLU/RSV plus assay is intended as an aid in the diagnosis of influenza from Nasopharyngeal swab specimens and should not be used as a sole basis for treatment. Nasal washings and aspirates are unacceptable for Xpert Xpress SARS-CoV-2/FLU/RSV testing.  Fact Sheet for Patients: BloggerCourse.com  Fact Sheet for Healthcare Providers: SeriousBroker.it  This test is not yet approved or cleared by the [place] FDA and has been authorized for detection and/or diagnosis of SARS-CoV-2 by FDA under an Emergency Use Authorization (EUA). This EUA will remain in effect (meaning this test can be used) for the duration of the COVID-19 declaration under Section 564(b)(1) of the Act, 21 U.S.C. section 360bbb-3(b)(1), unless the authorization is terminated or revoked.     Resp Syncytial Virus by PCR NEGATIVE NEGATIVE Final    Comment: (NOTE) Fact Sheet for Patients: BloggerCourse.com  Fact Sheet for Healthcare Providers: SeriousBroker.it  This test is not yet approved or cleared by the [place] FDA and has been authorized for detection and/or diagnosis of SARS-CoV-2 by FDA under an Emergency Use  Authorization (EUA). This EUA will remain in effect (meaning this test can be used) for the duration of the COVID-19 declaration under Section 564(b)(1) of the Act, 21 U.S.C. section 360bbb-3(b)(1), unless the authorization is terminated or revoked.  Performed at [facility], [address] [place], [place] [address]   Urine Culture (for pregnant, neutropenic or urologic patients or patients with an indwelling urinary catheter)     Status: Abnormal   Collection Time: 12/01/22  2:13 PM   Specimen: Urine, Suprapubic  Result Value Ref Range Status   Specimen Description   Final    URINE, SUPRAPUBIC Performed at [facility], [address] [place], [place] [address]    Special Requests   Final    NONE Performed at [facility], [address] [place], [place] [address]    Culture 80,000 COLONIES/mL PROVIDENCIA STUARTII (A)  Final   Report Status 12/03/2022 FINAL  Final   Organism ID, Bacteria PROVIDENCIA STUARTII (A)  Final      Susceptibility   Providencia stuartii - MIC*    AMPICILLIN >=32 RESISTANT Resistant     CEFEPIME <=0.12 SENSITIVE Sensitive     CEFTRIAXONE <=0.25 SENSITIVE Sensitive     CIPROFLOXACIN >=4 RESISTANT Resistant     GENTAMICIN RESISTANT Resistant     IMIPENEM 4 SENSITIVE Sensitive     NITROFURANTOIN 128 RESISTANT Resistant     TRIMETH/SULFA <=20 SENSITIVE Sensitive     AMPICILLIN/SULBACTAM >=32 RESISTANT Resistant     PIP/TAZO >=128 RESISTANT Resistant ug/mL    * 80,000 COLONIES/mL PROVIDENCIA STUARTII  MRSA Next Gen by PCR, Nasal     Status: Abnormal   Collection Time: 12/01/22  9:45 PM   Specimen: Nasal Mucosa; Nasal Swab  Result Value Ref Range Status   MRSA by PCR Next Gen DETECTED (A) NOT DETECTED Final    Comment: RESULT CALLED TO, READ BACK BY AND VERIFIED WITH: ALCIA FAWYER @ 1612 ON 12/02/22 C VARNER (NOTE) The GeneXpert MRSA Assay (FDA approved for NASAL specimens only), is one component of a comprehensive MRSA colonization  surveillance program. It is not intended to diagnose MRSA infection nor to guide or monitor treatment for MRSA infections. Test performance is not FDA approved in patients less than 57 years old.  Performed at [facility], [address], [place], [place] [address]     Labs: CBC: Recent Labs  Lab 12/01/22 1127 12/02/22 0343 12/03/22 0459 12/05/22 0413  WBC 21.2* 8.6 9.5 11.2*  NEUTROABS 17.8* 6.9 7.4  --   HGB 15.3 10.7* 10.8* 10.7*  HCT 46.6 33.6* 34.3* 34.0*  MCV 89.1 89.6 90.0 91.9  PLT 398 198 219 251   Basic Metabolic Panel: Recent Labs  Lab 12/02/22 0343 12/03/22 0459 12/04/22 0410 12/05/22 0413 12/06/22 0353 12/06/22 0354  NA 137 143 144 149*  149*  --  144  K 4.0 3.5 3.5 3.7  3.7  --  3.6  CL 108 113* 119* 113*  114*  --  111  CO2 18* 21* 22 23  24   --  24  GLUCOSE 88 104* 116* 108*  109*  --  133*  BUN 80* 66* 52* 44*  43*  --  40*  CREATININE 4.51* 3.13* 2.44* 2.04*  2.04*  --  1.83*  CALCIUM 9.0 9.3 8.9 9.2  9.2  --  9.0  MG 1.8 2.2 2.1 1.9 1.7  --   PHOS 3.1 2.4* 2.7 2.6 2.1* 2.1*   Liver Function Tests: Recent Labs  Lab 12/01/22 1127 12/02/22 0343 12/03/22 0459 12/04/22 0410 12/05/22 0413 12/06/22 0354  AST 22  --   --   --   --   --   ALT 15  --   --   --   --   --   ALKPHOS 120  --   --   --   --   --   BILITOT 0.9  --   --   --   --   --   PROT 8.8*  --   --   --   --   --   ALBUMIN 3.6 2.4* 2.4* 2.4* 2.4* 2.3*   CBG: Recent Labs  Lab 12/05/22 0741 12/05/22 1152 12/05/22 1655 12/05/22 2031 12/06/22 0302  GLUCAP 109* 155* 141* 156* 130*    Discharge time spent: greater than 30 minutes.  Signed: Catarina Hartshorn, MD [facility] 12/06/2022

## 2022-12-08 DIAGNOSIS — M6281 Muscle weakness (generalized): Secondary | ICD-10-CM | POA: Diagnosis not present

## 2022-12-08 DIAGNOSIS — I69391 Dysphagia following cerebral infarction: Secondary | ICD-10-CM | POA: Diagnosis not present

## 2022-12-08 DIAGNOSIS — R278 Other lack of coordination: Secondary | ICD-10-CM | POA: Diagnosis not present

## 2022-12-08 DIAGNOSIS — I69322 Dysarthria following cerebral infarction: Secondary | ICD-10-CM | POA: Diagnosis not present

## 2022-12-08 DIAGNOSIS — I69120 Aphasia following nontraumatic intracerebral hemorrhage: Secondary | ICD-10-CM | POA: Diagnosis not present

## 2022-12-08 DIAGNOSIS — M6289 Other specified disorders of muscle: Secondary | ICD-10-CM | POA: Diagnosis not present

## 2022-12-09 DIAGNOSIS — I69351 Hemiplegia and hemiparesis following cerebral infarction affecting right dominant side: Secondary | ICD-10-CM | POA: Diagnosis not present

## 2022-12-09 DIAGNOSIS — I69322 Dysarthria following cerebral infarction: Secondary | ICD-10-CM | POA: Diagnosis not present

## 2022-12-09 DIAGNOSIS — M6281 Muscle weakness (generalized): Secondary | ICD-10-CM | POA: Diagnosis not present

## 2022-12-09 DIAGNOSIS — R278 Other lack of coordination: Secondary | ICD-10-CM | POA: Diagnosis not present

## 2022-12-09 DIAGNOSIS — R5381 Other malaise: Secondary | ICD-10-CM | POA: Diagnosis not present

## 2022-12-09 DIAGNOSIS — I69120 Aphasia following nontraumatic intracerebral hemorrhage: Secondary | ICD-10-CM | POA: Diagnosis not present

## 2022-12-09 DIAGNOSIS — M6289 Other specified disorders of muscle: Secondary | ICD-10-CM | POA: Diagnosis not present

## 2022-12-09 DIAGNOSIS — I69391 Dysphagia following cerebral infarction: Secondary | ICD-10-CM | POA: Diagnosis not present

## 2022-12-09 DIAGNOSIS — N390 Urinary tract infection, site not specified: Secondary | ICD-10-CM | POA: Diagnosis not present

## 2022-12-10 DIAGNOSIS — I69391 Dysphagia following cerebral infarction: Secondary | ICD-10-CM | POA: Diagnosis not present

## 2022-12-10 DIAGNOSIS — I69120 Aphasia following nontraumatic intracerebral hemorrhage: Secondary | ICD-10-CM | POA: Diagnosis not present

## 2022-12-10 DIAGNOSIS — M6289 Other specified disorders of muscle: Secondary | ICD-10-CM | POA: Diagnosis not present

## 2022-12-10 DIAGNOSIS — I69322 Dysarthria following cerebral infarction: Secondary | ICD-10-CM | POA: Diagnosis not present

## 2022-12-10 DIAGNOSIS — M6281 Muscle weakness (generalized): Secondary | ICD-10-CM | POA: Diagnosis not present

## 2022-12-10 DIAGNOSIS — R278 Other lack of coordination: Secondary | ICD-10-CM | POA: Diagnosis not present

## 2022-12-11 ENCOUNTER — Telehealth: Payer: Self-pay

## 2022-12-11 DIAGNOSIS — M109 Gout, unspecified: Secondary | ICD-10-CM | POA: Diagnosis not present

## 2022-12-11 DIAGNOSIS — M6281 Muscle weakness (generalized): Secondary | ICD-10-CM | POA: Diagnosis not present

## 2022-12-11 DIAGNOSIS — I69141 Monoplegia of lower limb following nontraumatic intracerebral hemorrhage affecting right dominant side: Secondary | ICD-10-CM | POA: Diagnosis not present

## 2022-12-11 DIAGNOSIS — J690 Pneumonitis due to inhalation of food and vomit: Secondary | ICD-10-CM | POA: Diagnosis not present

## 2022-12-11 DIAGNOSIS — I69120 Aphasia following nontraumatic intracerebral hemorrhage: Secondary | ICD-10-CM | POA: Diagnosis not present

## 2022-12-11 DIAGNOSIS — I69322 Dysarthria following cerebral infarction: Secondary | ICD-10-CM | POA: Diagnosis not present

## 2022-12-11 DIAGNOSIS — I69391 Dysphagia following cerebral infarction: Secondary | ICD-10-CM | POA: Diagnosis not present

## 2022-12-11 DIAGNOSIS — R278 Other lack of coordination: Secondary | ICD-10-CM | POA: Diagnosis not present

## 2022-12-11 DIAGNOSIS — M24561 Contracture, right knee: Secondary | ICD-10-CM | POA: Diagnosis not present

## 2022-12-11 DIAGNOSIS — I480 Paroxysmal atrial fibrillation: Secondary | ICD-10-CM | POA: Diagnosis not present

## 2022-12-11 DIAGNOSIS — M6289 Other specified disorders of muscle: Secondary | ICD-10-CM | POA: Diagnosis not present

## 2022-12-11 NOTE — Telephone Encounter (Signed)
Victorino Dike RN at facility called requesting appointment for catheter change. Per ER note catheter was changed in 11/10 during admission. Nurse visit scheduled for 12/10 for cath change, facility notified.

## 2022-12-12 DIAGNOSIS — I69391 Dysphagia following cerebral infarction: Secondary | ICD-10-CM | POA: Diagnosis not present

## 2022-12-12 DIAGNOSIS — M6281 Muscle weakness (generalized): Secondary | ICD-10-CM | POA: Diagnosis not present

## 2022-12-12 DIAGNOSIS — I69120 Aphasia following nontraumatic intracerebral hemorrhage: Secondary | ICD-10-CM | POA: Diagnosis not present

## 2022-12-12 DIAGNOSIS — R278 Other lack of coordination: Secondary | ICD-10-CM | POA: Diagnosis not present

## 2022-12-12 DIAGNOSIS — I69322 Dysarthria following cerebral infarction: Secondary | ICD-10-CM | POA: Diagnosis not present

## 2022-12-12 DIAGNOSIS — M6289 Other specified disorders of muscle: Secondary | ICD-10-CM | POA: Diagnosis not present

## 2022-12-13 DIAGNOSIS — M6281 Muscle weakness (generalized): Secondary | ICD-10-CM | POA: Diagnosis not present

## 2022-12-13 DIAGNOSIS — I69322 Dysarthria following cerebral infarction: Secondary | ICD-10-CM | POA: Diagnosis not present

## 2022-12-13 DIAGNOSIS — I69391 Dysphagia following cerebral infarction: Secondary | ICD-10-CM | POA: Diagnosis not present

## 2022-12-13 DIAGNOSIS — R278 Other lack of coordination: Secondary | ICD-10-CM | POA: Diagnosis not present

## 2022-12-13 DIAGNOSIS — I10 Essential (primary) hypertension: Secondary | ICD-10-CM | POA: Diagnosis not present

## 2022-12-13 DIAGNOSIS — I69120 Aphasia following nontraumatic intracerebral hemorrhage: Secondary | ICD-10-CM | POA: Diagnosis not present

## 2022-12-13 DIAGNOSIS — M6289 Other specified disorders of muscle: Secondary | ICD-10-CM | POA: Diagnosis not present

## 2022-12-13 DIAGNOSIS — E119 Type 2 diabetes mellitus without complications: Secondary | ICD-10-CM | POA: Diagnosis not present

## 2022-12-16 ENCOUNTER — Ambulatory Visit: Payer: Medicare PPO | Admitting: Family Medicine

## 2022-12-16 DIAGNOSIS — R278 Other lack of coordination: Secondary | ICD-10-CM | POA: Diagnosis not present

## 2022-12-16 DIAGNOSIS — I69391 Dysphagia following cerebral infarction: Secondary | ICD-10-CM | POA: Diagnosis not present

## 2022-12-16 DIAGNOSIS — M6289 Other specified disorders of muscle: Secondary | ICD-10-CM | POA: Diagnosis not present

## 2022-12-16 DIAGNOSIS — I69322 Dysarthria following cerebral infarction: Secondary | ICD-10-CM | POA: Diagnosis not present

## 2022-12-16 DIAGNOSIS — I69120 Aphasia following nontraumatic intracerebral hemorrhage: Secondary | ICD-10-CM | POA: Diagnosis not present

## 2022-12-16 DIAGNOSIS — M6281 Muscle weakness (generalized): Secondary | ICD-10-CM | POA: Diagnosis not present

## 2022-12-17 DIAGNOSIS — M6289 Other specified disorders of muscle: Secondary | ICD-10-CM | POA: Diagnosis not present

## 2022-12-17 DIAGNOSIS — R278 Other lack of coordination: Secondary | ICD-10-CM | POA: Diagnosis not present

## 2022-12-17 DIAGNOSIS — I69391 Dysphagia following cerebral infarction: Secondary | ICD-10-CM | POA: Diagnosis not present

## 2022-12-17 DIAGNOSIS — M6281 Muscle weakness (generalized): Secondary | ICD-10-CM | POA: Diagnosis not present

## 2022-12-17 DIAGNOSIS — I69120 Aphasia following nontraumatic intracerebral hemorrhage: Secondary | ICD-10-CM | POA: Diagnosis not present

## 2022-12-17 DIAGNOSIS — I69322 Dysarthria following cerebral infarction: Secondary | ICD-10-CM | POA: Diagnosis not present

## 2022-12-18 DIAGNOSIS — R278 Other lack of coordination: Secondary | ICD-10-CM | POA: Diagnosis not present

## 2022-12-18 DIAGNOSIS — I69120 Aphasia following nontraumatic intracerebral hemorrhage: Secondary | ICD-10-CM | POA: Diagnosis not present

## 2022-12-18 DIAGNOSIS — M6289 Other specified disorders of muscle: Secondary | ICD-10-CM | POA: Diagnosis not present

## 2022-12-18 DIAGNOSIS — I69391 Dysphagia following cerebral infarction: Secondary | ICD-10-CM | POA: Diagnosis not present

## 2022-12-18 DIAGNOSIS — M6281 Muscle weakness (generalized): Secondary | ICD-10-CM | POA: Diagnosis not present

## 2022-12-18 DIAGNOSIS — I69322 Dysarthria following cerebral infarction: Secondary | ICD-10-CM | POA: Diagnosis not present

## 2022-12-19 DIAGNOSIS — M6281 Muscle weakness (generalized): Secondary | ICD-10-CM | POA: Diagnosis not present

## 2022-12-19 DIAGNOSIS — R278 Other lack of coordination: Secondary | ICD-10-CM | POA: Diagnosis not present

## 2022-12-19 DIAGNOSIS — I69391 Dysphagia following cerebral infarction: Secondary | ICD-10-CM | POA: Diagnosis not present

## 2022-12-19 DIAGNOSIS — M6289 Other specified disorders of muscle: Secondary | ICD-10-CM | POA: Diagnosis not present

## 2022-12-19 DIAGNOSIS — I69322 Dysarthria following cerebral infarction: Secondary | ICD-10-CM | POA: Diagnosis not present

## 2022-12-19 DIAGNOSIS — I69120 Aphasia following nontraumatic intracerebral hemorrhage: Secondary | ICD-10-CM | POA: Diagnosis not present

## 2022-12-20 DIAGNOSIS — M6281 Muscle weakness (generalized): Secondary | ICD-10-CM | POA: Diagnosis not present

## 2022-12-20 DIAGNOSIS — M6289 Other specified disorders of muscle: Secondary | ICD-10-CM | POA: Diagnosis not present

## 2022-12-20 DIAGNOSIS — I69120 Aphasia following nontraumatic intracerebral hemorrhage: Secondary | ICD-10-CM | POA: Diagnosis not present

## 2022-12-20 DIAGNOSIS — R278 Other lack of coordination: Secondary | ICD-10-CM | POA: Diagnosis not present

## 2022-12-20 DIAGNOSIS — I69391 Dysphagia following cerebral infarction: Secondary | ICD-10-CM | POA: Diagnosis not present

## 2022-12-20 DIAGNOSIS — I69322 Dysarthria following cerebral infarction: Secondary | ICD-10-CM | POA: Diagnosis not present

## 2022-12-22 DIAGNOSIS — I69391 Dysphagia following cerebral infarction: Secondary | ICD-10-CM | POA: Diagnosis not present

## 2022-12-22 DIAGNOSIS — I69120 Aphasia following nontraumatic intracerebral hemorrhage: Secondary | ICD-10-CM | POA: Diagnosis not present

## 2022-12-22 DIAGNOSIS — I69322 Dysarthria following cerebral infarction: Secondary | ICD-10-CM | POA: Diagnosis not present

## 2022-12-22 DIAGNOSIS — M6289 Other specified disorders of muscle: Secondary | ICD-10-CM | POA: Diagnosis not present

## 2022-12-22 DIAGNOSIS — R278 Other lack of coordination: Secondary | ICD-10-CM | POA: Diagnosis not present

## 2022-12-22 DIAGNOSIS — M6281 Muscle weakness (generalized): Secondary | ICD-10-CM | POA: Diagnosis not present

## 2022-12-24 DIAGNOSIS — I69120 Aphasia following nontraumatic intracerebral hemorrhage: Secondary | ICD-10-CM | POA: Diagnosis not present

## 2022-12-24 DIAGNOSIS — M6289 Other specified disorders of muscle: Secondary | ICD-10-CM | POA: Diagnosis not present

## 2022-12-24 DIAGNOSIS — R278 Other lack of coordination: Secondary | ICD-10-CM | POA: Diagnosis not present

## 2022-12-24 DIAGNOSIS — I69391 Dysphagia following cerebral infarction: Secondary | ICD-10-CM | POA: Diagnosis not present

## 2022-12-24 DIAGNOSIS — M6281 Muscle weakness (generalized): Secondary | ICD-10-CM | POA: Diagnosis not present

## 2022-12-24 DIAGNOSIS — I69322 Dysarthria following cerebral infarction: Secondary | ICD-10-CM | POA: Diagnosis not present

## 2022-12-25 DIAGNOSIS — R278 Other lack of coordination: Secondary | ICD-10-CM | POA: Diagnosis not present

## 2022-12-25 DIAGNOSIS — R3989 Other symptoms and signs involving the genitourinary system: Secondary | ICD-10-CM | POA: Diagnosis not present

## 2022-12-25 DIAGNOSIS — M6289 Other specified disorders of muscle: Secondary | ICD-10-CM | POA: Diagnosis not present

## 2022-12-25 DIAGNOSIS — I69391 Dysphagia following cerebral infarction: Secondary | ICD-10-CM | POA: Diagnosis not present

## 2022-12-25 DIAGNOSIS — M6281 Muscle weakness (generalized): Secondary | ICD-10-CM | POA: Diagnosis not present

## 2022-12-25 DIAGNOSIS — I69120 Aphasia following nontraumatic intracerebral hemorrhage: Secondary | ICD-10-CM | POA: Diagnosis not present

## 2022-12-25 DIAGNOSIS — I69322 Dysarthria following cerebral infarction: Secondary | ICD-10-CM | POA: Diagnosis not present

## 2022-12-26 DIAGNOSIS — I69120 Aphasia following nontraumatic intracerebral hemorrhage: Secondary | ICD-10-CM | POA: Diagnosis not present

## 2022-12-26 DIAGNOSIS — N390 Urinary tract infection, site not specified: Secondary | ICD-10-CM | POA: Diagnosis not present

## 2022-12-26 DIAGNOSIS — I69391 Dysphagia following cerebral infarction: Secondary | ICD-10-CM | POA: Diagnosis not present

## 2022-12-26 DIAGNOSIS — R278 Other lack of coordination: Secondary | ICD-10-CM | POA: Diagnosis not present

## 2022-12-26 DIAGNOSIS — M6289 Other specified disorders of muscle: Secondary | ICD-10-CM | POA: Diagnosis not present

## 2022-12-26 DIAGNOSIS — M6281 Muscle weakness (generalized): Secondary | ICD-10-CM | POA: Diagnosis not present

## 2022-12-26 DIAGNOSIS — I69322 Dysarthria following cerebral infarction: Secondary | ICD-10-CM | POA: Diagnosis not present

## 2022-12-27 DIAGNOSIS — N319 Neuromuscular dysfunction of bladder, unspecified: Secondary | ICD-10-CM | POA: Diagnosis not present

## 2022-12-28 DIAGNOSIS — R278 Other lack of coordination: Secondary | ICD-10-CM | POA: Diagnosis not present

## 2022-12-28 DIAGNOSIS — I69120 Aphasia following nontraumatic intracerebral hemorrhage: Secondary | ICD-10-CM | POA: Diagnosis not present

## 2022-12-28 DIAGNOSIS — M6281 Muscle weakness (generalized): Secondary | ICD-10-CM | POA: Diagnosis not present

## 2022-12-28 DIAGNOSIS — I69322 Dysarthria following cerebral infarction: Secondary | ICD-10-CM | POA: Diagnosis not present

## 2022-12-28 DIAGNOSIS — I69391 Dysphagia following cerebral infarction: Secondary | ICD-10-CM | POA: Diagnosis not present

## 2022-12-28 DIAGNOSIS — M6289 Other specified disorders of muscle: Secondary | ICD-10-CM | POA: Diagnosis not present

## 2022-12-29 DIAGNOSIS — I69120 Aphasia following nontraumatic intracerebral hemorrhage: Secondary | ICD-10-CM | POA: Diagnosis not present

## 2022-12-29 DIAGNOSIS — R278 Other lack of coordination: Secondary | ICD-10-CM | POA: Diagnosis not present

## 2022-12-29 DIAGNOSIS — I69391 Dysphagia following cerebral infarction: Secondary | ICD-10-CM | POA: Diagnosis not present

## 2022-12-29 DIAGNOSIS — M6281 Muscle weakness (generalized): Secondary | ICD-10-CM | POA: Diagnosis not present

## 2022-12-29 DIAGNOSIS — M6289 Other specified disorders of muscle: Secondary | ICD-10-CM | POA: Diagnosis not present

## 2022-12-29 DIAGNOSIS — I69322 Dysarthria following cerebral infarction: Secondary | ICD-10-CM | POA: Diagnosis not present

## 2022-12-31 ENCOUNTER — Ambulatory Visit (INDEPENDENT_AMBULATORY_CARE_PROVIDER_SITE_OTHER): Payer: Medicare PPO

## 2022-12-31 DIAGNOSIS — I69120 Aphasia following nontraumatic intracerebral hemorrhage: Secondary | ICD-10-CM | POA: Diagnosis not present

## 2022-12-31 DIAGNOSIS — E440 Moderate protein-calorie malnutrition: Secondary | ICD-10-CM | POA: Diagnosis not present

## 2022-12-31 DIAGNOSIS — N319 Neuromuscular dysfunction of bladder, unspecified: Secondary | ICD-10-CM

## 2022-12-31 DIAGNOSIS — I6990 Unspecified sequelae of unspecified cerebrovascular disease: Secondary | ICD-10-CM | POA: Diagnosis not present

## 2022-12-31 DIAGNOSIS — R339 Retention of urine, unspecified: Secondary | ICD-10-CM

## 2022-12-31 DIAGNOSIS — G8191 Hemiplegia, unspecified affecting right dominant side: Secondary | ICD-10-CM | POA: Diagnosis not present

## 2022-12-31 DIAGNOSIS — I69141 Monoplegia of lower limb following nontraumatic intracerebral hemorrhage affecting right dominant side: Secondary | ICD-10-CM | POA: Diagnosis not present

## 2022-12-31 DIAGNOSIS — Z8679 Personal history of other diseases of the circulatory system: Secondary | ICD-10-CM | POA: Diagnosis not present

## 2022-12-31 DIAGNOSIS — I4891 Unspecified atrial fibrillation: Secondary | ICD-10-CM | POA: Diagnosis not present

## 2022-12-31 DIAGNOSIS — N183 Chronic kidney disease, stage 3 unspecified: Secondary | ICD-10-CM | POA: Diagnosis not present

## 2022-12-31 NOTE — Progress Notes (Addendum)
Suprapubic Cath Change  Patient is present today for a suprapubic catheter change due to urinary retention.  10ml of water was drained from the balloon, a 20FR foley cath was removed from the tract with out difficulty.  Site was cleaned and prepped in a sterile fashion with betadine.  A 20FR foley cath was replaced into the tract no complications were noted. Urine return was noted, 10 ml of sterile water was inflated into the balloon and a bedside bag was attached for drainage.  Patient tolerated well.  Performed by: Linh Johannes LPN  Follow up: keep scheduled NV   Per wife upcoming NV cancelled and patient will have monthly sp catheter changes done at skilled facility. 1000 ml of urine return noted during catheter change.  Noted on patients progress note to facility.

## 2023-01-01 DIAGNOSIS — R278 Other lack of coordination: Secondary | ICD-10-CM | POA: Diagnosis not present

## 2023-01-01 DIAGNOSIS — M6289 Other specified disorders of muscle: Secondary | ICD-10-CM | POA: Diagnosis not present

## 2023-01-01 DIAGNOSIS — I69120 Aphasia following nontraumatic intracerebral hemorrhage: Secondary | ICD-10-CM | POA: Diagnosis not present

## 2023-01-01 DIAGNOSIS — I69391 Dysphagia following cerebral infarction: Secondary | ICD-10-CM | POA: Diagnosis not present

## 2023-01-01 DIAGNOSIS — M6281 Muscle weakness (generalized): Secondary | ICD-10-CM | POA: Diagnosis not present

## 2023-01-01 DIAGNOSIS — I69322 Dysarthria following cerebral infarction: Secondary | ICD-10-CM | POA: Diagnosis not present

## 2023-01-02 DIAGNOSIS — I69322 Dysarthria following cerebral infarction: Secondary | ICD-10-CM | POA: Diagnosis not present

## 2023-01-02 DIAGNOSIS — M6281 Muscle weakness (generalized): Secondary | ICD-10-CM | POA: Diagnosis not present

## 2023-01-02 DIAGNOSIS — I69120 Aphasia following nontraumatic intracerebral hemorrhage: Secondary | ICD-10-CM | POA: Diagnosis not present

## 2023-01-02 DIAGNOSIS — R278 Other lack of coordination: Secondary | ICD-10-CM | POA: Diagnosis not present

## 2023-01-02 DIAGNOSIS — I69391 Dysphagia following cerebral infarction: Secondary | ICD-10-CM | POA: Diagnosis not present

## 2023-01-02 DIAGNOSIS — M6289 Other specified disorders of muscle: Secondary | ICD-10-CM | POA: Diagnosis not present

## 2023-01-03 DIAGNOSIS — R278 Other lack of coordination: Secondary | ICD-10-CM | POA: Diagnosis not present

## 2023-01-03 DIAGNOSIS — I69391 Dysphagia following cerebral infarction: Secondary | ICD-10-CM | POA: Diagnosis not present

## 2023-01-03 DIAGNOSIS — I69120 Aphasia following nontraumatic intracerebral hemorrhage: Secondary | ICD-10-CM | POA: Diagnosis not present

## 2023-01-03 DIAGNOSIS — M6281 Muscle weakness (generalized): Secondary | ICD-10-CM | POA: Diagnosis not present

## 2023-01-03 DIAGNOSIS — I69322 Dysarthria following cerebral infarction: Secondary | ICD-10-CM | POA: Diagnosis not present

## 2023-01-03 DIAGNOSIS — M6289 Other specified disorders of muscle: Secondary | ICD-10-CM | POA: Diagnosis not present

## 2023-01-06 DIAGNOSIS — I69391 Dysphagia following cerebral infarction: Secondary | ICD-10-CM | POA: Diagnosis not present

## 2023-01-06 DIAGNOSIS — R278 Other lack of coordination: Secondary | ICD-10-CM | POA: Diagnosis not present

## 2023-01-06 DIAGNOSIS — I69322 Dysarthria following cerebral infarction: Secondary | ICD-10-CM | POA: Diagnosis not present

## 2023-01-06 DIAGNOSIS — M6289 Other specified disorders of muscle: Secondary | ICD-10-CM | POA: Diagnosis not present

## 2023-01-06 DIAGNOSIS — M6281 Muscle weakness (generalized): Secondary | ICD-10-CM | POA: Diagnosis not present

## 2023-01-06 DIAGNOSIS — I69120 Aphasia following nontraumatic intracerebral hemorrhage: Secondary | ICD-10-CM | POA: Diagnosis not present

## 2023-01-07 DIAGNOSIS — M6289 Other specified disorders of muscle: Secondary | ICD-10-CM | POA: Diagnosis not present

## 2023-01-07 DIAGNOSIS — M6281 Muscle weakness (generalized): Secondary | ICD-10-CM | POA: Diagnosis not present

## 2023-01-07 DIAGNOSIS — R278 Other lack of coordination: Secondary | ICD-10-CM | POA: Diagnosis not present

## 2023-01-07 DIAGNOSIS — G4733 Obstructive sleep apnea (adult) (pediatric): Secondary | ICD-10-CM | POA: Diagnosis not present

## 2023-01-07 DIAGNOSIS — D649 Anemia, unspecified: Secondary | ICD-10-CM | POA: Diagnosis not present

## 2023-01-07 DIAGNOSIS — I69391 Dysphagia following cerebral infarction: Secondary | ICD-10-CM | POA: Diagnosis not present

## 2023-01-07 DIAGNOSIS — I69322 Dysarthria following cerebral infarction: Secondary | ICD-10-CM | POA: Diagnosis not present

## 2023-01-07 DIAGNOSIS — I69120 Aphasia following nontraumatic intracerebral hemorrhage: Secondary | ICD-10-CM | POA: Diagnosis not present

## 2023-01-08 DIAGNOSIS — M6281 Muscle weakness (generalized): Secondary | ICD-10-CM | POA: Diagnosis not present

## 2023-01-08 DIAGNOSIS — M6289 Other specified disorders of muscle: Secondary | ICD-10-CM | POA: Diagnosis not present

## 2023-01-08 DIAGNOSIS — R278 Other lack of coordination: Secondary | ICD-10-CM | POA: Diagnosis not present

## 2023-01-08 DIAGNOSIS — I69391 Dysphagia following cerebral infarction: Secondary | ICD-10-CM | POA: Diagnosis not present

## 2023-01-08 DIAGNOSIS — I69120 Aphasia following nontraumatic intracerebral hemorrhage: Secondary | ICD-10-CM | POA: Diagnosis not present

## 2023-01-08 DIAGNOSIS — I69322 Dysarthria following cerebral infarction: Secondary | ICD-10-CM | POA: Diagnosis not present

## 2023-01-09 DIAGNOSIS — F339 Major depressive disorder, recurrent, unspecified: Secondary | ICD-10-CM | POA: Diagnosis not present

## 2023-01-09 DIAGNOSIS — I480 Paroxysmal atrial fibrillation: Secondary | ICD-10-CM | POA: Diagnosis not present

## 2023-01-09 DIAGNOSIS — I69322 Dysarthria following cerebral infarction: Secondary | ICD-10-CM | POA: Diagnosis not present

## 2023-01-09 DIAGNOSIS — R278 Other lack of coordination: Secondary | ICD-10-CM | POA: Diagnosis not present

## 2023-01-09 DIAGNOSIS — M24561 Contracture, right knee: Secondary | ICD-10-CM | POA: Diagnosis not present

## 2023-01-09 DIAGNOSIS — M6281 Muscle weakness (generalized): Secondary | ICD-10-CM | POA: Diagnosis not present

## 2023-01-09 DIAGNOSIS — I69141 Monoplegia of lower limb following nontraumatic intracerebral hemorrhage affecting right dominant side: Secondary | ICD-10-CM | POA: Diagnosis not present

## 2023-01-09 DIAGNOSIS — N2889 Other specified disorders of kidney and ureter: Secondary | ICD-10-CM | POA: Diagnosis not present

## 2023-01-09 DIAGNOSIS — M24562 Contracture, left knee: Secondary | ICD-10-CM | POA: Diagnosis not present

## 2023-01-09 DIAGNOSIS — I69391 Dysphagia following cerebral infarction: Secondary | ICD-10-CM | POA: Diagnosis not present

## 2023-01-09 DIAGNOSIS — M6289 Other specified disorders of muscle: Secondary | ICD-10-CM | POA: Diagnosis not present

## 2023-01-09 DIAGNOSIS — I69120 Aphasia following nontraumatic intracerebral hemorrhage: Secondary | ICD-10-CM | POA: Diagnosis not present

## 2023-01-09 DIAGNOSIS — J690 Pneumonitis due to inhalation of food and vomit: Secondary | ICD-10-CM | POA: Diagnosis not present

## 2023-01-10 DIAGNOSIS — M6281 Muscle weakness (generalized): Secondary | ICD-10-CM | POA: Diagnosis not present

## 2023-01-10 DIAGNOSIS — I69120 Aphasia following nontraumatic intracerebral hemorrhage: Secondary | ICD-10-CM | POA: Diagnosis not present

## 2023-01-10 DIAGNOSIS — I69322 Dysarthria following cerebral infarction: Secondary | ICD-10-CM | POA: Diagnosis not present

## 2023-01-10 DIAGNOSIS — I69391 Dysphagia following cerebral infarction: Secondary | ICD-10-CM | POA: Diagnosis not present

## 2023-01-10 DIAGNOSIS — M6289 Other specified disorders of muscle: Secondary | ICD-10-CM | POA: Diagnosis not present

## 2023-01-10 DIAGNOSIS — R278 Other lack of coordination: Secondary | ICD-10-CM | POA: Diagnosis not present

## 2023-01-30 DIAGNOSIS — N319 Neuromuscular dysfunction of bladder, unspecified: Secondary | ICD-10-CM | POA: Diagnosis not present

## 2023-01-31 ENCOUNTER — Ambulatory Visit: Payer: Medicare PPO

## 2023-02-05 DIAGNOSIS — R5381 Other malaise: Secondary | ICD-10-CM | POA: Diagnosis not present

## 2023-02-05 DIAGNOSIS — M24561 Contracture, right knee: Secondary | ICD-10-CM | POA: Diagnosis not present

## 2023-02-05 DIAGNOSIS — M24562 Contracture, left knee: Secondary | ICD-10-CM | POA: Diagnosis not present

## 2023-02-05 DIAGNOSIS — R21 Rash and other nonspecific skin eruption: Secondary | ICD-10-CM | POA: Diagnosis not present

## 2023-02-05 DIAGNOSIS — M6281 Muscle weakness (generalized): Secondary | ICD-10-CM | POA: Diagnosis not present

## 2023-02-06 ENCOUNTER — Ambulatory Visit (INDEPENDENT_AMBULATORY_CARE_PROVIDER_SITE_OTHER): Payer: Medicare PPO

## 2023-02-06 DIAGNOSIS — I495 Sick sinus syndrome: Secondary | ICD-10-CM | POA: Diagnosis not present

## 2023-02-06 LAB — CUP PACEART REMOTE DEVICE CHECK
Battery Remaining Longevity: 45 mo
Battery Voltage: 2.94 V
Brady Statistic AP VP Percent: 58.95 %
Brady Statistic AP VS Percent: 0.01 %
Brady Statistic AS VP Percent: 40.58 %
Brady Statistic AS VS Percent: 0.46 %
Brady Statistic RA Percent Paced: 59.11 %
Brady Statistic RV Percent Paced: 99.54 %
Date Time Interrogation Session: 20250116055548
Implantable Lead Connection Status: 753985
Implantable Lead Connection Status: 753985
Implantable Lead Implant Date: 20180327
Implantable Lead Implant Date: 20180327
Implantable Lead Location: 753859
Implantable Lead Location: 753860
Implantable Lead Model: 5076
Implantable Lead Model: 5076
Implantable Pulse Generator Implant Date: 20180327
Lead Channel Impedance Value: 285 Ohm
Lead Channel Impedance Value: 342 Ohm
Lead Channel Impedance Value: 399 Ohm
Lead Channel Impedance Value: 437 Ohm
Lead Channel Pacing Threshold Amplitude: 0.5 V
Lead Channel Pacing Threshold Amplitude: 0.625 V
Lead Channel Pacing Threshold Pulse Width: 0.4 ms
Lead Channel Pacing Threshold Pulse Width: 0.4 ms
Lead Channel Sensing Intrinsic Amplitude: 2 mV
Lead Channel Sensing Intrinsic Amplitude: 2 mV
Lead Channel Sensing Intrinsic Amplitude: 23.5 mV
Lead Channel Sensing Intrinsic Amplitude: 23.5 mV
Lead Channel Setting Pacing Amplitude: 1.5 V
Lead Channel Setting Pacing Amplitude: 2 V
Lead Channel Setting Pacing Pulse Width: 0.4 ms
Lead Channel Setting Sensing Sensitivity: 1.2 mV
Zone Setting Status: 755011

## 2023-02-10 DIAGNOSIS — I2510 Atherosclerotic heart disease of native coronary artery without angina pectoris: Secondary | ICD-10-CM | POA: Diagnosis not present

## 2023-02-10 DIAGNOSIS — F32A Depression, unspecified: Secondary | ICD-10-CM | POA: Diagnosis not present

## 2023-02-10 DIAGNOSIS — R1013 Epigastric pain: Secondary | ICD-10-CM | POA: Diagnosis not present

## 2023-02-21 DIAGNOSIS — Z960 Presence of urogenital implants: Secondary | ICD-10-CM | POA: Diagnosis not present

## 2023-02-21 DIAGNOSIS — I4891 Unspecified atrial fibrillation: Secondary | ICD-10-CM | POA: Diagnosis not present

## 2023-02-21 DIAGNOSIS — I69141 Monoplegia of lower limb following nontraumatic intracerebral hemorrhage affecting right dominant side: Secondary | ICD-10-CM | POA: Diagnosis not present

## 2023-02-21 DIAGNOSIS — Z8679 Personal history of other diseases of the circulatory system: Secondary | ICD-10-CM | POA: Diagnosis not present

## 2023-02-21 DIAGNOSIS — G8191 Hemiplegia, unspecified affecting right dominant side: Secondary | ICD-10-CM | POA: Diagnosis not present

## 2023-02-21 DIAGNOSIS — I6990 Unspecified sequelae of unspecified cerebrovascular disease: Secondary | ICD-10-CM | POA: Diagnosis not present

## 2023-02-21 DIAGNOSIS — Z515 Encounter for palliative care: Secondary | ICD-10-CM | POA: Diagnosis not present

## 2023-02-21 DIAGNOSIS — I69120 Aphasia following nontraumatic intracerebral hemorrhage: Secondary | ICD-10-CM | POA: Diagnosis not present

## 2023-02-27 ENCOUNTER — Telehealth: Payer: Self-pay | Admitting: Pulmonary Disease

## 2023-02-27 DIAGNOSIS — N319 Neuromuscular dysfunction of bladder, unspecified: Secondary | ICD-10-CM | POA: Diagnosis not present

## 2023-02-27 NOTE — Telephone Encounter (Signed)
 Patient is in a nursing home and the wife is having issues with the night nurse putting on his bipap mask at night. She would like to speak with Dr.Alva about this issue.She can be reached at  [phone]

## 2023-02-27 NOTE — Telephone Encounter (Signed)
 Spoke with patients wife and advised her to speak with the supervising Dr from the facility and ask him to order the mask to be placed after he goes to sleep at night, as she has spoken to the [facility] and she is not making sure this is done. She verbalizes understanding

## 2023-03-11 DIAGNOSIS — Z79899 Other long term (current) drug therapy: Secondary | ICD-10-CM | POA: Diagnosis not present

## 2023-03-11 DIAGNOSIS — M6281 Muscle weakness (generalized): Secondary | ICD-10-CM | POA: Diagnosis not present

## 2023-03-14 DIAGNOSIS — M6281 Muscle weakness (generalized): Secondary | ICD-10-CM | POA: Diagnosis not present

## 2023-03-17 DIAGNOSIS — M6281 Muscle weakness (generalized): Secondary | ICD-10-CM | POA: Diagnosis not present

## 2023-03-17 NOTE — Progress Notes (Signed)
 Remote pacemaker transmission.

## 2023-03-21 DIAGNOSIS — M6281 Muscle weakness (generalized): Secondary | ICD-10-CM | POA: Diagnosis not present

## 2023-03-24 DIAGNOSIS — M6281 Muscle weakness (generalized): Secondary | ICD-10-CM | POA: Diagnosis not present

## 2023-03-26 DIAGNOSIS — L120 Bullous pemphigoid: Secondary | ICD-10-CM | POA: Diagnosis not present

## 2023-03-26 DIAGNOSIS — M6281 Muscle weakness (generalized): Secondary | ICD-10-CM | POA: Diagnosis not present

## 2023-03-26 DIAGNOSIS — R5381 Other malaise: Secondary | ICD-10-CM | POA: Diagnosis not present

## 2023-03-26 DIAGNOSIS — R21 Rash and other nonspecific skin eruption: Secondary | ICD-10-CM | POA: Diagnosis not present

## 2023-03-27 DIAGNOSIS — N319 Neuromuscular dysfunction of bladder, unspecified: Secondary | ICD-10-CM | POA: Diagnosis not present

## 2023-03-27 DIAGNOSIS — M6281 Muscle weakness (generalized): Secondary | ICD-10-CM | POA: Diagnosis not present

## 2023-03-28 DIAGNOSIS — M6281 Muscle weakness (generalized): Secondary | ICD-10-CM | POA: Diagnosis not present

## 2023-03-31 DIAGNOSIS — M6281 Muscle weakness (generalized): Secondary | ICD-10-CM | POA: Diagnosis not present

## 2023-04-01 DIAGNOSIS — L120 Bullous pemphigoid: Secondary | ICD-10-CM | POA: Diagnosis not present

## 2023-04-01 DIAGNOSIS — R5381 Other malaise: Secondary | ICD-10-CM | POA: Diagnosis not present

## 2023-04-01 DIAGNOSIS — M6281 Muscle weakness (generalized): Secondary | ICD-10-CM | POA: Diagnosis not present

## 2023-04-01 DIAGNOSIS — R21 Rash and other nonspecific skin eruption: Secondary | ICD-10-CM | POA: Diagnosis not present

## 2023-04-02 DIAGNOSIS — M6281 Muscle weakness (generalized): Secondary | ICD-10-CM | POA: Diagnosis not present

## 2023-04-03 DIAGNOSIS — M6281 Muscle weakness (generalized): Secondary | ICD-10-CM | POA: Diagnosis not present

## 2023-04-04 DIAGNOSIS — M6281 Muscle weakness (generalized): Secondary | ICD-10-CM | POA: Diagnosis not present

## 2023-04-05 DIAGNOSIS — M6281 Muscle weakness (generalized): Secondary | ICD-10-CM | POA: Diagnosis not present

## 2023-04-07 DIAGNOSIS — M6281 Muscle weakness (generalized): Secondary | ICD-10-CM | POA: Diagnosis not present

## 2023-04-08 DIAGNOSIS — E119 Type 2 diabetes mellitus without complications: Secondary | ICD-10-CM | POA: Diagnosis not present

## 2023-04-08 DIAGNOSIS — N1832 Chronic kidney disease, stage 3b: Secondary | ICD-10-CM | POA: Diagnosis not present

## 2023-04-08 DIAGNOSIS — G629 Polyneuropathy, unspecified: Secondary | ICD-10-CM | POA: Diagnosis not present

## 2023-04-08 DIAGNOSIS — M6281 Muscle weakness (generalized): Secondary | ICD-10-CM | POA: Diagnosis not present

## 2023-04-09 DIAGNOSIS — M6281 Muscle weakness (generalized): Secondary | ICD-10-CM | POA: Diagnosis not present

## 2023-04-10 DIAGNOSIS — G4700 Insomnia, unspecified: Secondary | ICD-10-CM | POA: Diagnosis not present

## 2023-04-10 DIAGNOSIS — M6281 Muscle weakness (generalized): Secondary | ICD-10-CM | POA: Diagnosis not present

## 2023-04-11 DIAGNOSIS — M6281 Muscle weakness (generalized): Secondary | ICD-10-CM | POA: Diagnosis not present

## 2023-04-14 DIAGNOSIS — E441 Mild protein-calorie malnutrition: Secondary | ICD-10-CM | POA: Diagnosis not present

## 2023-04-14 DIAGNOSIS — I69120 Aphasia following nontraumatic intracerebral hemorrhage: Secondary | ICD-10-CM | POA: Diagnosis not present

## 2023-04-14 DIAGNOSIS — I4891 Unspecified atrial fibrillation: Secondary | ICD-10-CM | POA: Diagnosis not present

## 2023-04-14 DIAGNOSIS — M6281 Muscle weakness (generalized): Secondary | ICD-10-CM | POA: Diagnosis not present

## 2023-04-14 DIAGNOSIS — G8191 Hemiplegia, unspecified affecting right dominant side: Secondary | ICD-10-CM | POA: Diagnosis not present

## 2023-04-14 DIAGNOSIS — Z960 Presence of urogenital implants: Secondary | ICD-10-CM | POA: Diagnosis not present

## 2023-04-14 DIAGNOSIS — Z515 Encounter for palliative care: Secondary | ICD-10-CM | POA: Diagnosis not present

## 2023-04-14 DIAGNOSIS — Z8679 Personal history of other diseases of the circulatory system: Secondary | ICD-10-CM | POA: Diagnosis not present

## 2023-04-14 DIAGNOSIS — I619 Nontraumatic intracerebral hemorrhage, unspecified: Secondary | ICD-10-CM | POA: Diagnosis not present

## 2023-04-15 DIAGNOSIS — M6281 Muscle weakness (generalized): Secondary | ICD-10-CM | POA: Diagnosis not present

## 2023-04-16 DIAGNOSIS — M6281 Muscle weakness (generalized): Secondary | ICD-10-CM | POA: Diagnosis not present

## 2023-04-17 DIAGNOSIS — M6281 Muscle weakness (generalized): Secondary | ICD-10-CM | POA: Diagnosis not present

## 2023-04-18 DIAGNOSIS — M6281 Muscle weakness (generalized): Secondary | ICD-10-CM | POA: Diagnosis not present

## 2023-04-21 DIAGNOSIS — M6281 Muscle weakness (generalized): Secondary | ICD-10-CM | POA: Diagnosis not present

## 2023-04-22 DIAGNOSIS — R5381 Other malaise: Secondary | ICD-10-CM | POA: Diagnosis not present

## 2023-04-22 DIAGNOSIS — I69391 Dysphagia following cerebral infarction: Secondary | ICD-10-CM | POA: Diagnosis not present

## 2023-04-22 DIAGNOSIS — E119 Type 2 diabetes mellitus without complications: Secondary | ICD-10-CM | POA: Diagnosis not present

## 2023-04-22 DIAGNOSIS — G4700 Insomnia, unspecified: Secondary | ICD-10-CM | POA: Diagnosis not present

## 2023-04-22 DIAGNOSIS — M6281 Muscle weakness (generalized): Secondary | ICD-10-CM | POA: Diagnosis not present

## 2023-04-23 DIAGNOSIS — I69391 Dysphagia following cerebral infarction: Secondary | ICD-10-CM | POA: Diagnosis not present

## 2023-04-23 DIAGNOSIS — M6281 Muscle weakness (generalized): Secondary | ICD-10-CM | POA: Diagnosis not present

## 2023-04-24 DIAGNOSIS — M6281 Muscle weakness (generalized): Secondary | ICD-10-CM | POA: Diagnosis not present

## 2023-04-24 DIAGNOSIS — I69391 Dysphagia following cerebral infarction: Secondary | ICD-10-CM | POA: Diagnosis not present

## 2023-04-24 DIAGNOSIS — N319 Neuromuscular dysfunction of bladder, unspecified: Secondary | ICD-10-CM | POA: Diagnosis not present

## 2023-04-25 DIAGNOSIS — M6281 Muscle weakness (generalized): Secondary | ICD-10-CM | POA: Diagnosis not present

## 2023-04-25 DIAGNOSIS — I69391 Dysphagia following cerebral infarction: Secondary | ICD-10-CM | POA: Diagnosis not present

## 2023-04-28 DIAGNOSIS — M6281 Muscle weakness (generalized): Secondary | ICD-10-CM | POA: Diagnosis not present

## 2023-04-28 DIAGNOSIS — I69391 Dysphagia following cerebral infarction: Secondary | ICD-10-CM | POA: Diagnosis not present

## 2023-04-29 DIAGNOSIS — I69391 Dysphagia following cerebral infarction: Secondary | ICD-10-CM | POA: Diagnosis not present

## 2023-04-29 DIAGNOSIS — G629 Polyneuropathy, unspecified: Secondary | ICD-10-CM | POA: Diagnosis not present

## 2023-04-29 DIAGNOSIS — E119 Type 2 diabetes mellitus without complications: Secondary | ICD-10-CM | POA: Diagnosis not present

## 2023-04-29 DIAGNOSIS — M6281 Muscle weakness (generalized): Secondary | ICD-10-CM | POA: Diagnosis not present

## 2023-04-29 DIAGNOSIS — R5381 Other malaise: Secondary | ICD-10-CM | POA: Diagnosis not present

## 2023-04-29 DIAGNOSIS — N1832 Chronic kidney disease, stage 3b: Secondary | ICD-10-CM | POA: Diagnosis not present

## 2023-04-30 DIAGNOSIS — M6281 Muscle weakness (generalized): Secondary | ICD-10-CM | POA: Diagnosis not present

## 2023-04-30 DIAGNOSIS — I69391 Dysphagia following cerebral infarction: Secondary | ICD-10-CM | POA: Diagnosis not present

## 2023-05-01 DIAGNOSIS — I69391 Dysphagia following cerebral infarction: Secondary | ICD-10-CM | POA: Diagnosis not present

## 2023-05-01 DIAGNOSIS — M6281 Muscle weakness (generalized): Secondary | ICD-10-CM | POA: Diagnosis not present

## 2023-05-02 DIAGNOSIS — I69391 Dysphagia following cerebral infarction: Secondary | ICD-10-CM | POA: Diagnosis not present

## 2023-05-02 DIAGNOSIS — M6281 Muscle weakness (generalized): Secondary | ICD-10-CM | POA: Diagnosis not present

## 2023-05-04 IMAGING — CT CT HEAD W/O CM
4 series · 15 of 47 positions shown, 17 images · non-contrast
Comparison: None.

CLINICAL DATA: Facial trauma, blunt; Neck trauma (Age >= 65y); Head
trauma, minor (Age >= 65y). Fall unwitnessed



[Series 2: head w o · axial · 0.45mm/px · z∈[-618,-493]mm · 7 of 35 slices shown, 9 images]
[im 5/35  brain]
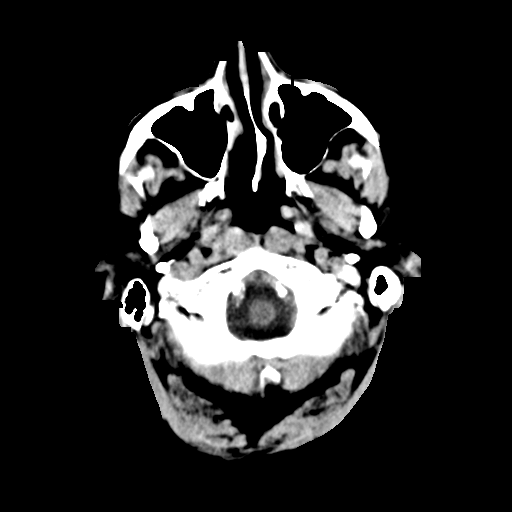
[im 5/35  bone]
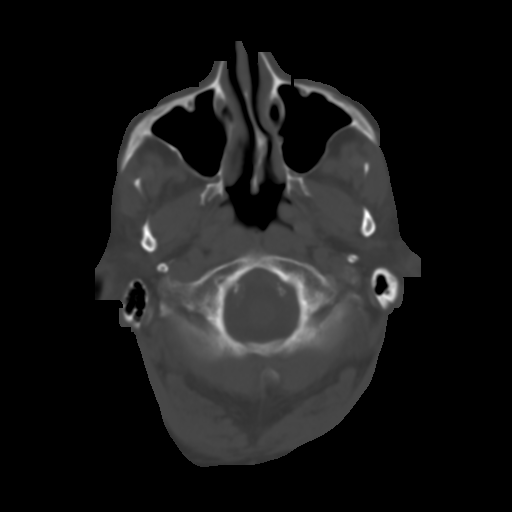
[im 9/35  brain]
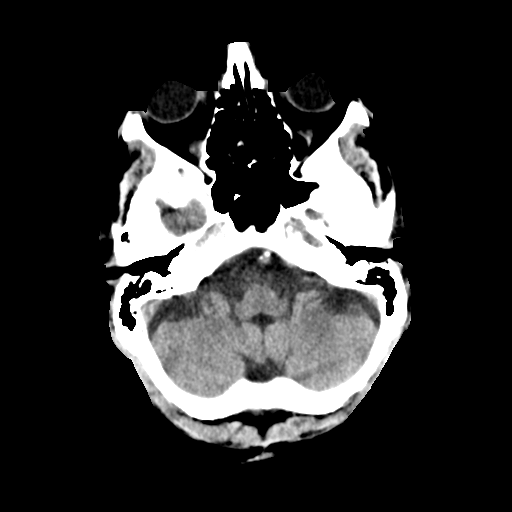
[im 13/35  brain]
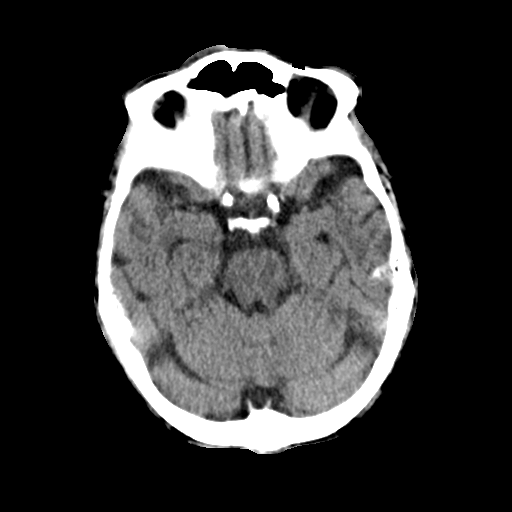
[im 18/35  brain]
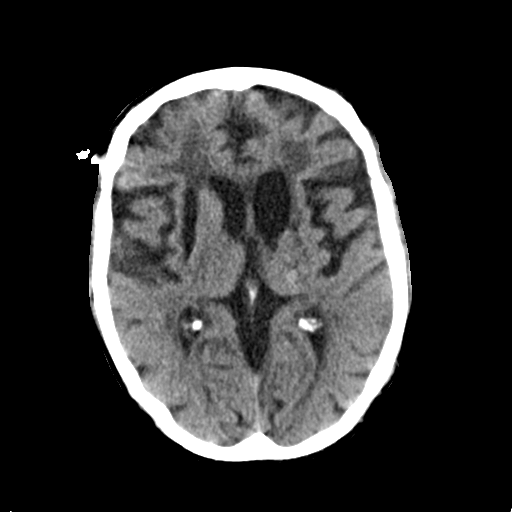
[im 22/35  brain]
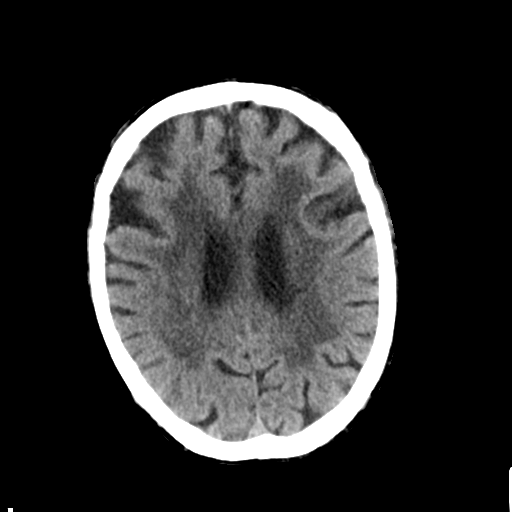
[im 22/35  bone]
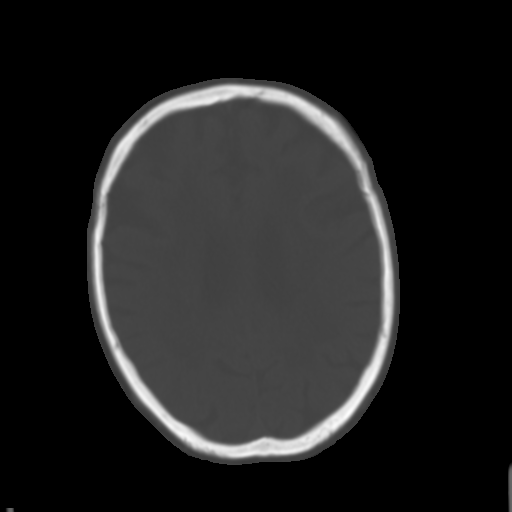
[im 26/35  brain]
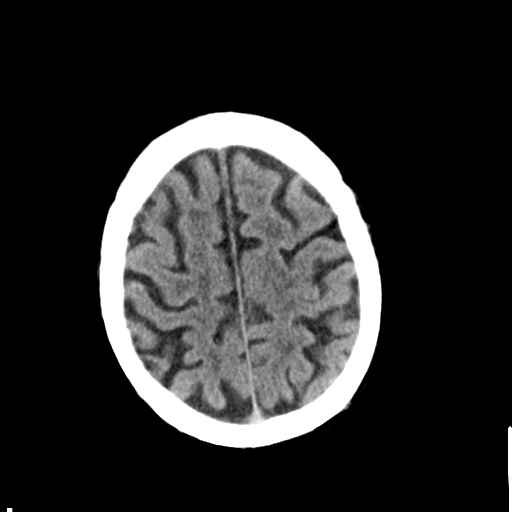
[im 30/35  brain]
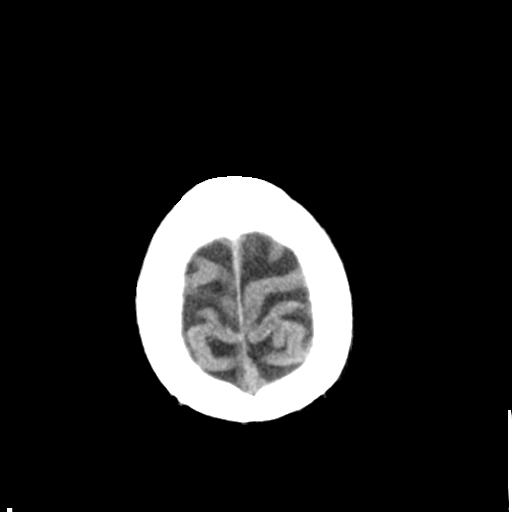

[Series 3: head bone · axial · 0.45mm/px · z∈[-622,-604]mm · 2 of 87 slices shown]
[im 9/87  bone]
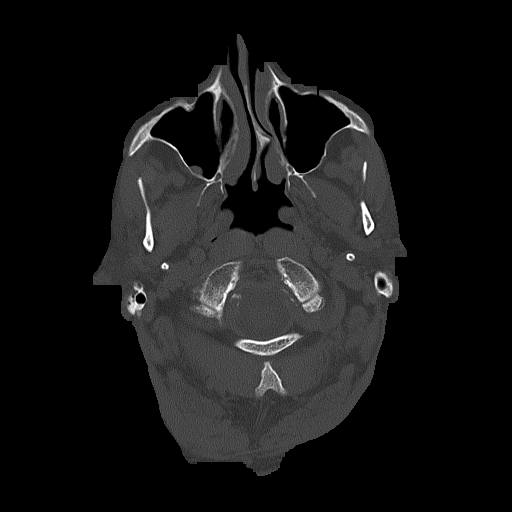
[im 18/87  bone]
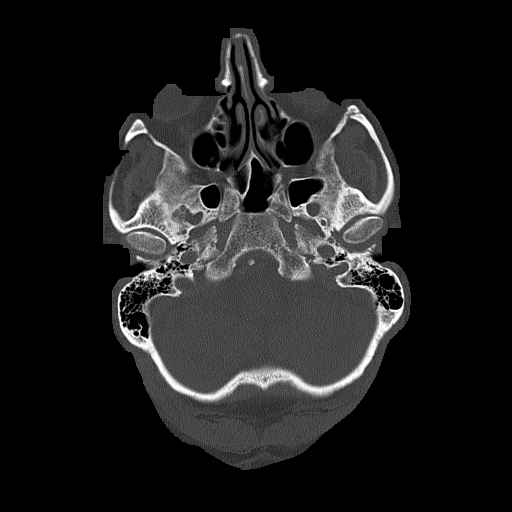

[Series 4: coronal soft · coronal · 0.35mm/px · 3 of 70 slices shown]
[im 24/70  brain]
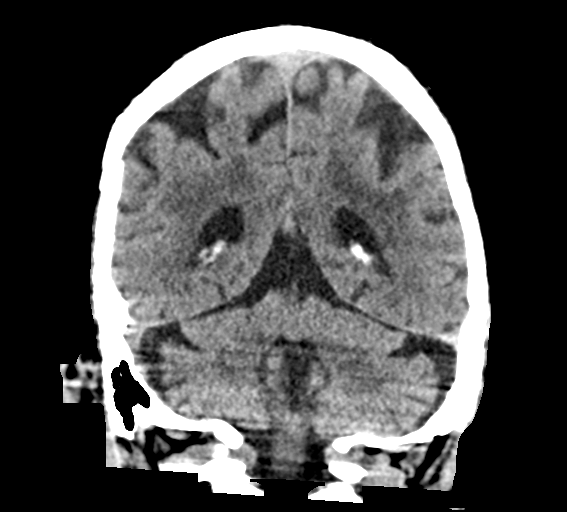
[im 31/70  brain]
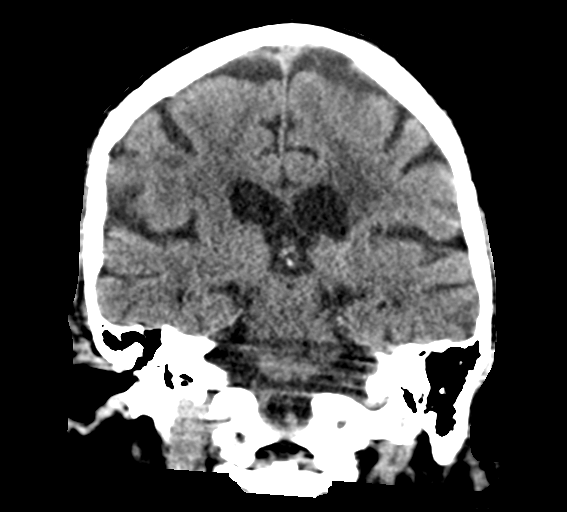
[im 39/70  brain]
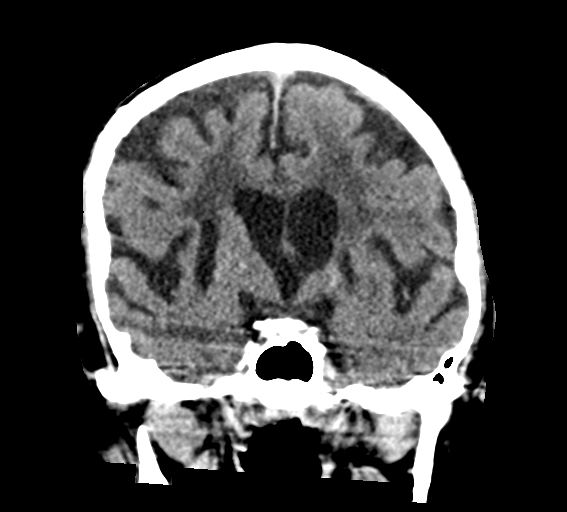

[Series 5: sagittal soft · sagittal · 0.37mm/px · 3 of 60 slices shown]
[im 20/60  brain]
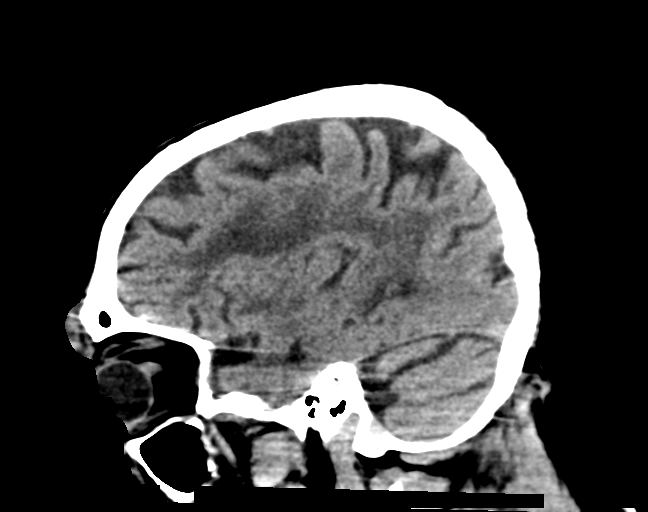
[im 30/60  brain]
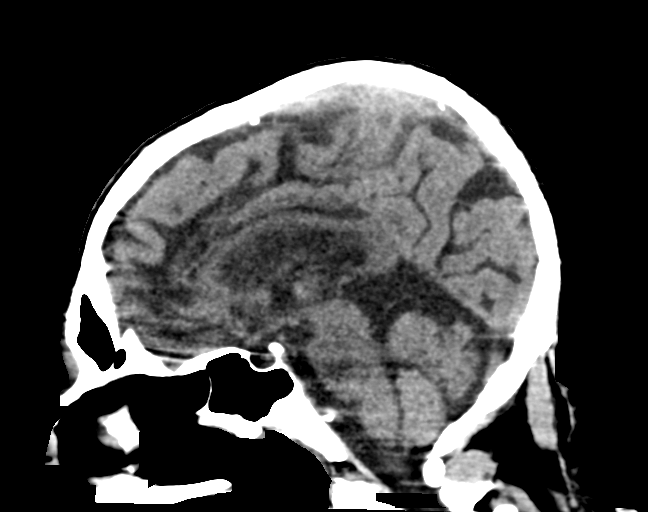
[im 40/60  brain]
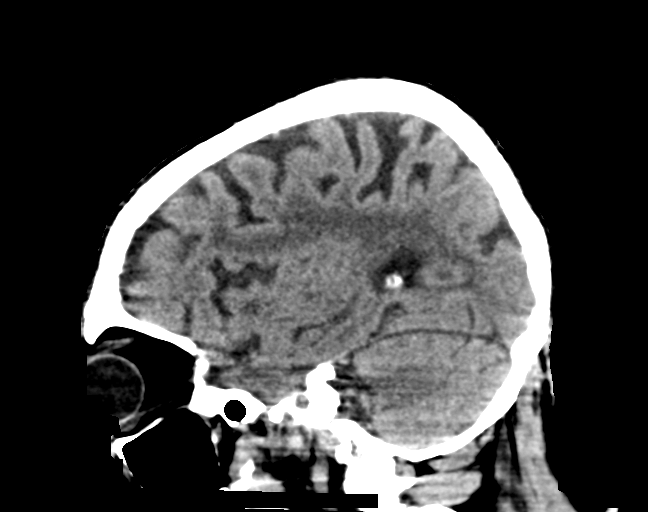

[15 of 47 positions shown; findings below may reference images not displayed]

FINDINGS: CT HEAD FINDINGS

BRAIN:
BRAIN
Cerebral ventricle sizes are concordant with the degree of cerebral
volume loss. Patchy and confluent areas of decreased attenuation are
noted throughout the deep and periventricular white matter of the
cerebral hemispheres bilaterally, compatible with chronic
microvascular ischemic disease.

No evidence of large-territorial acute infarction. No parenchymal
hemorrhage. No mass lesion. No extra-axial collection.

No mass effect or midline shift. No hydrocephalus. Basilar cisterns
are patent.

Vascular: No hyperdense vessel. Atherosclerotic calcifications are
present within the cavernous internal carotid and vertebral
arteries.

Skull: No acute fracture or focal lesion.

Other: Midline slightly rightward lower frontal scalp hematoma
formation measuring up to 9 mm.

CT MAXILLOFACIAL FINDINGS

Osseous: No fracture or mandibular dislocation. No destructive
process.

Sinuses/Orbits: Paranasal sinuses and mastoid air cells are clear.
The orbits are unremarkable.

Soft tissues: Negative.

CT CERVICAL SPINE FINDINGS

Alignment: Normal.

Skull base and vertebrae: Multilevel degenerative changes of the
spine with associated severe osseous neural foraminal stenosis at
the bilateral C3-C4, C4-C5, C5-C6 levels. No acute fracture. No
aggressive appearing focal osseous lesion or focal pathologic
process.

Soft tissues and spinal canal: No prevertebral fluid or swelling. No
visible canal hematoma.

Upper chest: Unremarkable.

Other: Atherosclerotic plaque of the aortic arch and its branches.
IMPRESSION: 1. No acute intracranial abnormality.
2. No acute displaced facial fracture.
3. No acute displaced fracture or traumatic listhesis of the
cervical spine.
4. Multilevel degenerative changes of the spine with associated
severe osseous neural foraminal stenosis at the bilateral C3-C4,
C4-C5, C5-C6 levels.
5.  Aortic Atherosclerosis (JA6LD-E3E.E).

## 2023-05-04 IMAGING — CT CT CERVICAL SPINE W/O CM
3 of 4 series · 10 of 33 positions shown, 12 images · non-contrast
Comparison: None.

CLINICAL DATA: Facial trauma, blunt; Neck trauma (Age >= 65y); Head
trauma, minor (Age >= 65y). Fall unwitnessed



[Series 5: sag bone · sagittal · 0.29mm/px · 5 of 61 slices shown, 6 images]
[im 21/61  bone]
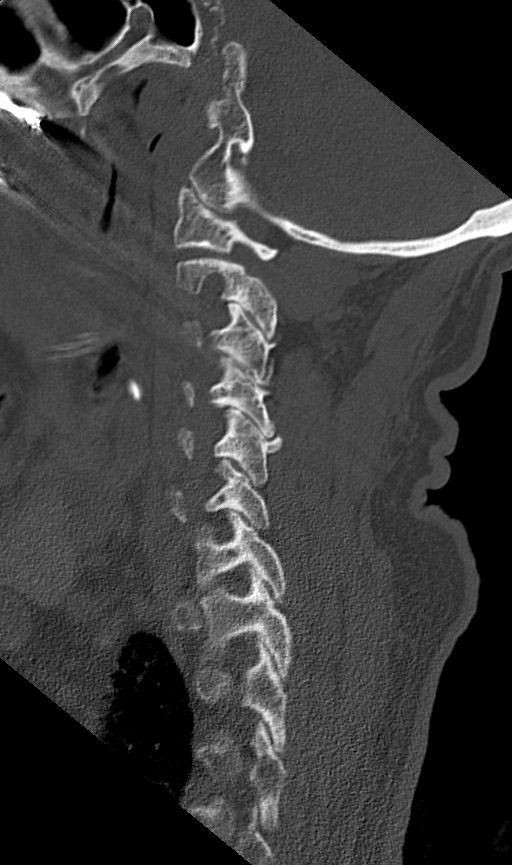
[im 26/61  bone]
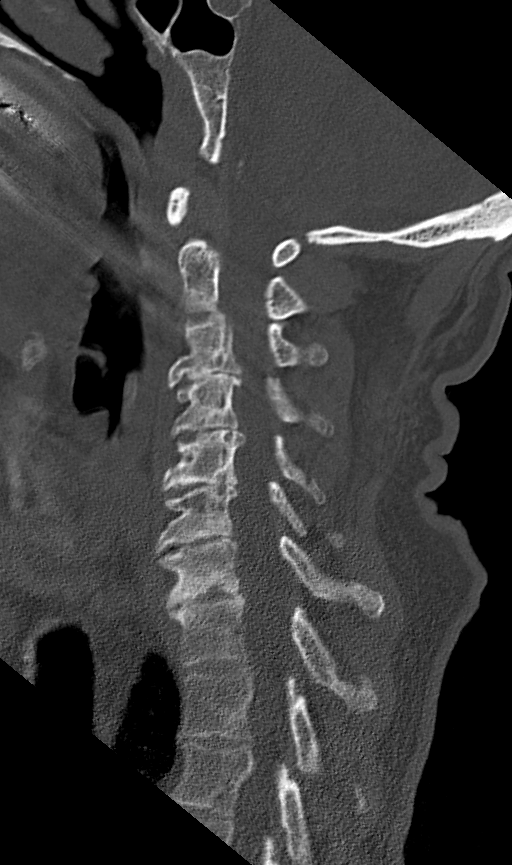
[im 31/61  soft-tissue]
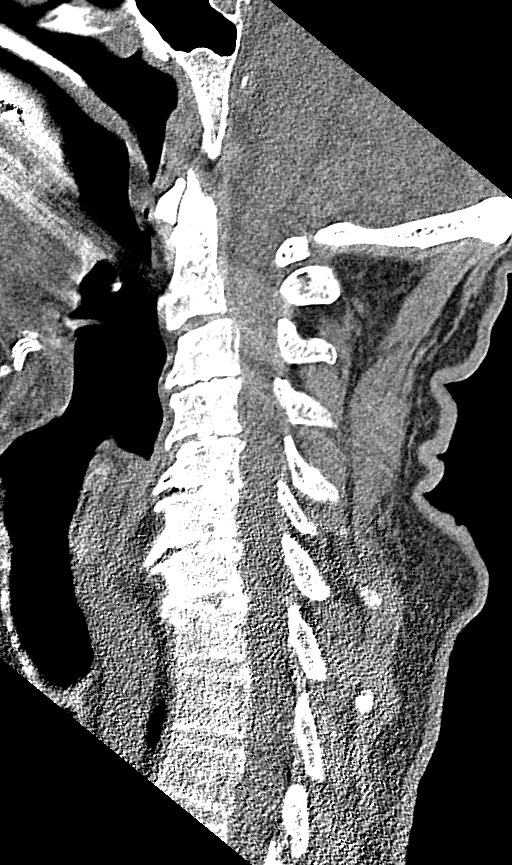
[im 31/61  bone]
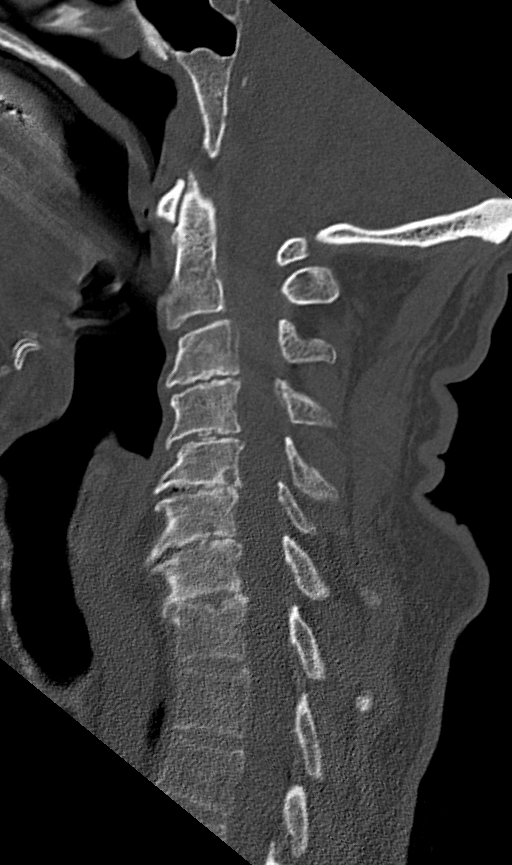
[im 36/61  bone]
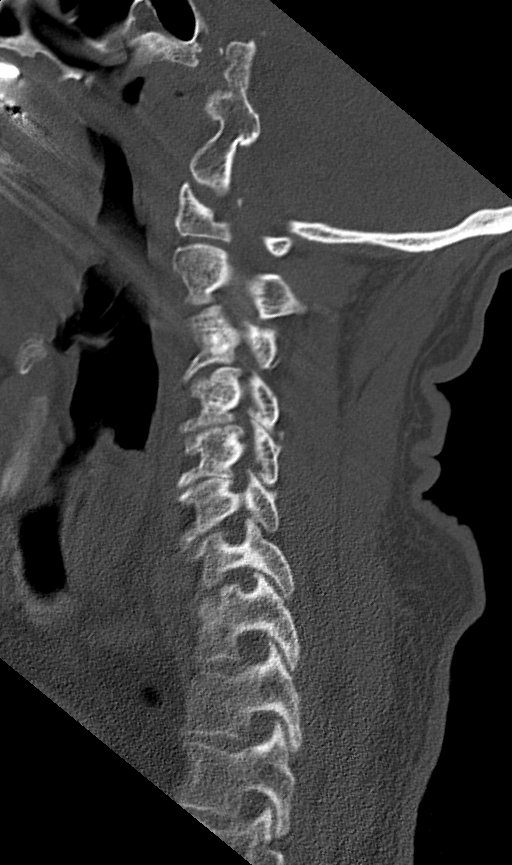
[im 41/61  bone]
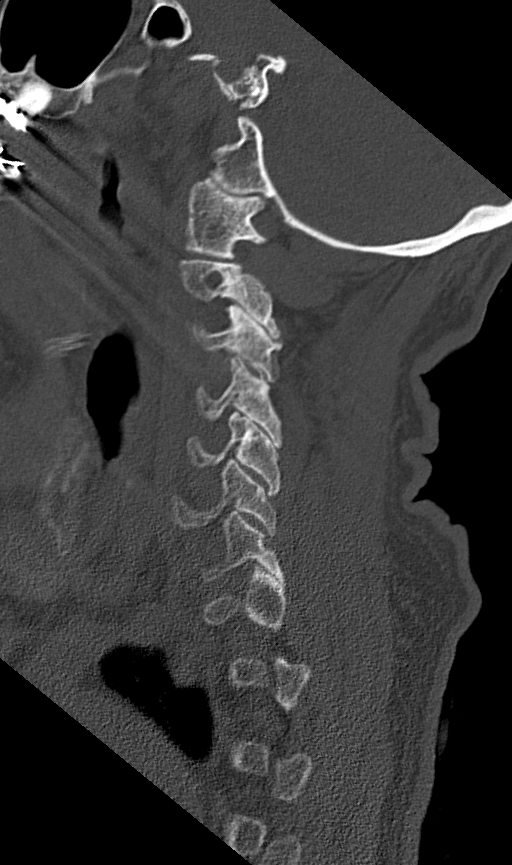

[Series 6: cor bone · coronal · 0.29mm/px · 3 of 71 slices shown]
[im 24/71  bone]
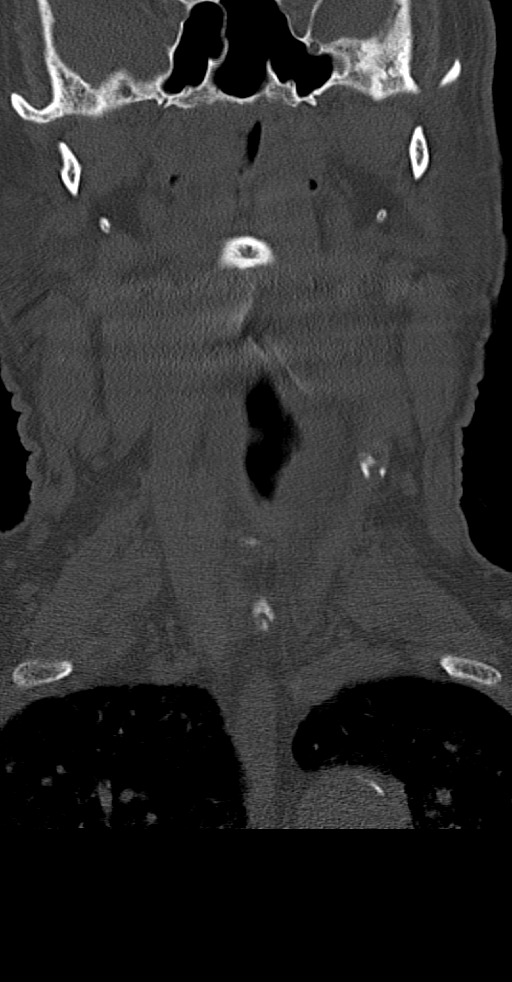
[im 32/71  bone]
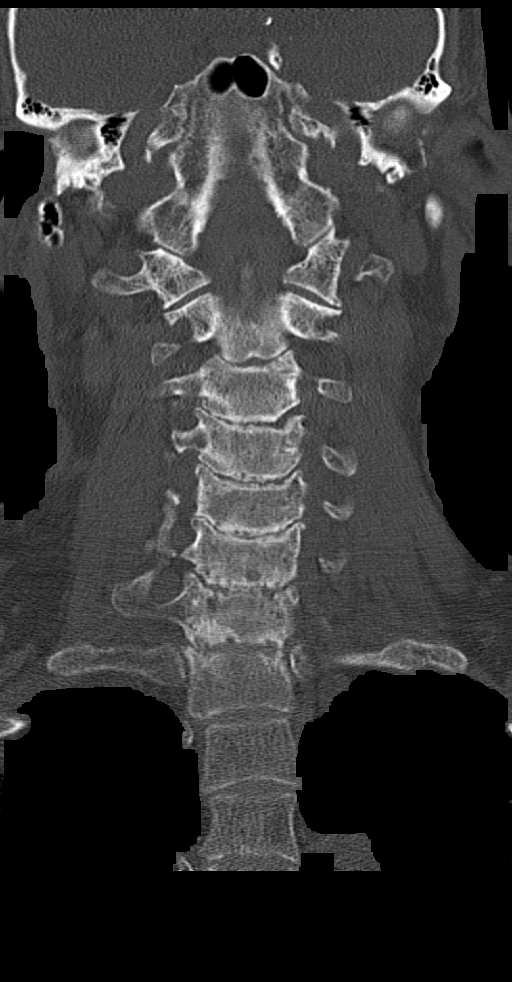
[im 40/71  bone]
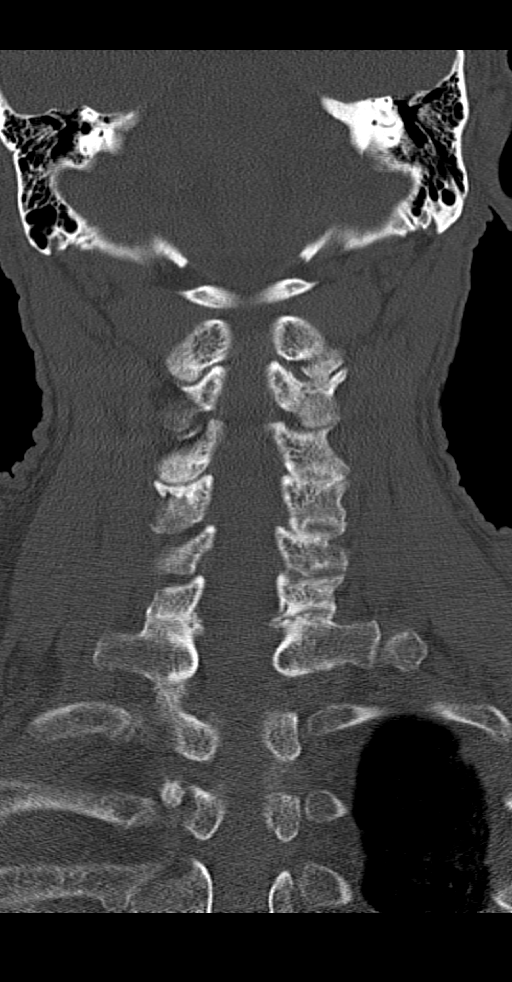

[Series 7: orthogonal axials · oblique · 0.21mm/px · 2 of 79 slices shown, 3 images]
[im 32/79  soft-tissue]
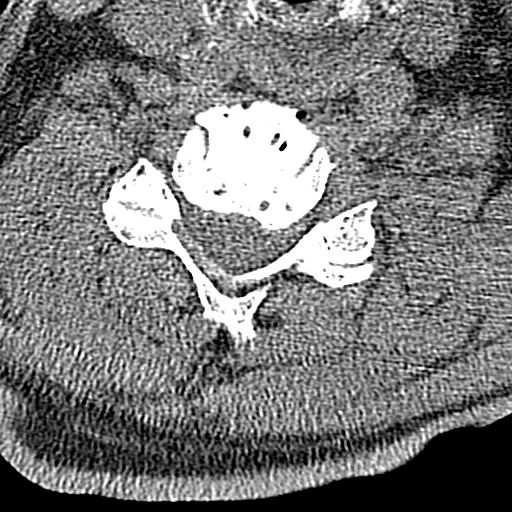
[im 32/79  bone]
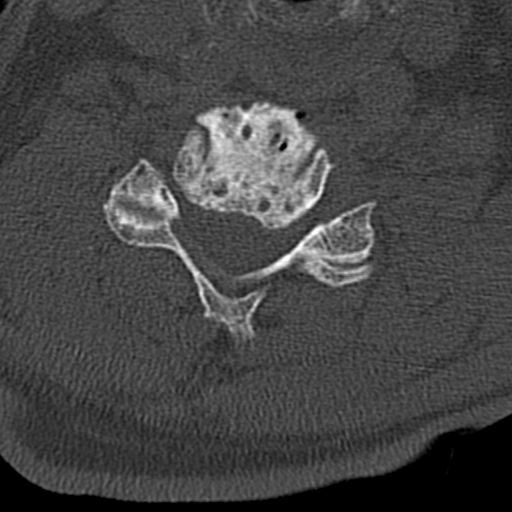
[im 63/79  bone]
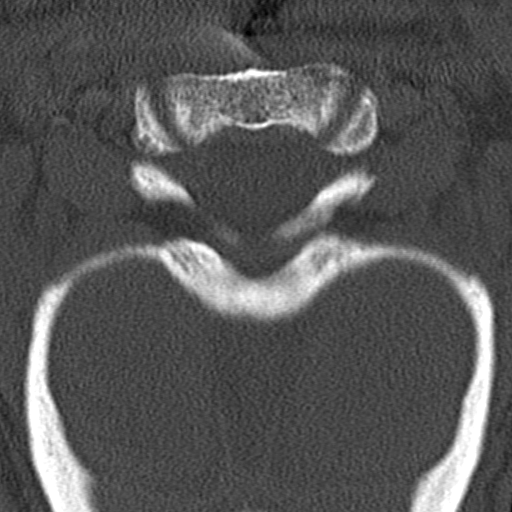

[10 of 33 positions shown; findings below may reference images not displayed]

FINDINGS: CT HEAD FINDINGS

BRAIN:
BRAIN
Cerebral ventricle sizes are concordant with the degree of cerebral
volume loss. Patchy and confluent areas of decreased attenuation are
noted throughout the deep and periventricular white matter of the
cerebral hemispheres bilaterally, compatible with chronic
microvascular ischemic disease.

No evidence of large-territorial acute infarction. No parenchymal
hemorrhage. No mass lesion. No extra-axial collection.

No mass effect or midline shift. No hydrocephalus. Basilar cisterns
are patent.

Vascular: No hyperdense vessel. Atherosclerotic calcifications are
present within the cavernous internal carotid and vertebral
arteries.

Skull: No acute fracture or focal lesion.

Other: Midline slightly rightward lower frontal scalp hematoma
formation measuring up to 9 mm.

CT MAXILLOFACIAL FINDINGS

Osseous: No fracture or mandibular dislocation. No destructive
process.

Sinuses/Orbits: Paranasal sinuses and mastoid air cells are clear.
The orbits are unremarkable.

Soft tissues: Negative.

CT CERVICAL SPINE FINDINGS

Alignment: Normal.

Skull base and vertebrae: Multilevel degenerative changes of the
spine with associated severe osseous neural foraminal stenosis at
the bilateral C3-C4, C4-C5, C5-C6 levels. No acute fracture. No
aggressive appearing focal osseous lesion or focal pathologic
process.

Soft tissues and spinal canal: No prevertebral fluid or swelling. No
visible canal hematoma.

Upper chest: Unremarkable.

Other: Atherosclerotic plaque of the aortic arch and its branches.
IMPRESSION: 1. No acute intracranial abnormality.
2. No acute displaced facial fracture.
3. No acute displaced fracture or traumatic listhesis of the
cervical spine.
4. Multilevel degenerative changes of the spine with associated
severe osseous neural foraminal stenosis at the bilateral C3-C4,
C4-C5, C5-C6 levels.
5.  Aortic Atherosclerosis (JA6LD-E3E.E).

## 2023-05-05 DIAGNOSIS — R21 Rash and other nonspecific skin eruption: Secondary | ICD-10-CM | POA: Diagnosis not present

## 2023-05-05 DIAGNOSIS — M6281 Muscle weakness (generalized): Secondary | ICD-10-CM | POA: Diagnosis not present

## 2023-05-05 DIAGNOSIS — E119 Type 2 diabetes mellitus without complications: Secondary | ICD-10-CM | POA: Diagnosis not present

## 2023-05-05 DIAGNOSIS — I69391 Dysphagia following cerebral infarction: Secondary | ICD-10-CM | POA: Diagnosis not present

## 2023-05-05 DIAGNOSIS — L120 Bullous pemphigoid: Secondary | ICD-10-CM | POA: Diagnosis not present

## 2023-05-06 DIAGNOSIS — I2510 Atherosclerotic heart disease of native coronary artery without angina pectoris: Secondary | ICD-10-CM | POA: Diagnosis not present

## 2023-05-06 DIAGNOSIS — M6281 Muscle weakness (generalized): Secondary | ICD-10-CM | POA: Diagnosis not present

## 2023-05-06 DIAGNOSIS — G629 Polyneuropathy, unspecified: Secondary | ICD-10-CM | POA: Diagnosis not present

## 2023-05-06 DIAGNOSIS — I69391 Dysphagia following cerebral infarction: Secondary | ICD-10-CM | POA: Diagnosis not present

## 2023-05-06 DIAGNOSIS — E785 Hyperlipidemia, unspecified: Secondary | ICD-10-CM | POA: Diagnosis not present

## 2023-05-07 DIAGNOSIS — M6281 Muscle weakness (generalized): Secondary | ICD-10-CM | POA: Diagnosis not present

## 2023-05-07 DIAGNOSIS — I69391 Dysphagia following cerebral infarction: Secondary | ICD-10-CM | POA: Diagnosis not present

## 2023-05-08 ENCOUNTER — Ambulatory Visit (INDEPENDENT_AMBULATORY_CARE_PROVIDER_SITE_OTHER): Payer: Medicare PPO

## 2023-05-08 DIAGNOSIS — I495 Sick sinus syndrome: Secondary | ICD-10-CM

## 2023-05-08 DIAGNOSIS — M6281 Muscle weakness (generalized): Secondary | ICD-10-CM | POA: Diagnosis not present

## 2023-05-08 DIAGNOSIS — I69391 Dysphagia following cerebral infarction: Secondary | ICD-10-CM | POA: Diagnosis not present

## 2023-05-09 DIAGNOSIS — I69391 Dysphagia following cerebral infarction: Secondary | ICD-10-CM | POA: Diagnosis not present

## 2023-05-09 DIAGNOSIS — M6281 Muscle weakness (generalized): Secondary | ICD-10-CM | POA: Diagnosis not present

## 2023-05-09 LAB — CUP PACEART REMOTE DEVICE CHECK
Battery Remaining Longevity: 43 mo
Battery Voltage: 2.94 V
Brady Statistic AP VP Percent: 90.42 %
Brady Statistic AP VS Percent: 0.01 %
Brady Statistic AS VP Percent: 8.52 %
Brady Statistic AS VS Percent: 1.05 %
Brady Statistic RA Percent Paced: 90.7 %
Brady Statistic RV Percent Paced: 98.94 %
Date Time Interrogation Session: 20250417011501
Implantable Lead Connection Status: 753985
Implantable Lead Connection Status: 753985
Implantable Lead Implant Date: 20180327
Implantable Lead Implant Date: 20180327
Implantable Lead Location: 753859
Implantable Lead Location: 753860
Implantable Lead Model: 5076
Implantable Lead Model: 5076
Implantable Pulse Generator Implant Date: 20180327
Lead Channel Impedance Value: 285 Ohm
Lead Channel Impedance Value: 304 Ohm
Lead Channel Impedance Value: 361 Ohm
Lead Channel Impedance Value: 456 Ohm
Lead Channel Pacing Threshold Amplitude: 0.625 V
Lead Channel Pacing Threshold Amplitude: 0.625 V
Lead Channel Pacing Threshold Pulse Width: 0.4 ms
Lead Channel Pacing Threshold Pulse Width: 0.4 ms
Lead Channel Sensing Intrinsic Amplitude: 1.875 mV
Lead Channel Sensing Intrinsic Amplitude: 1.875 mV
Lead Channel Sensing Intrinsic Amplitude: 23.5 mV
Lead Channel Sensing Intrinsic Amplitude: 23.5 mV
Lead Channel Setting Pacing Amplitude: 1.5 V
Lead Channel Setting Pacing Amplitude: 2 V
Lead Channel Setting Pacing Pulse Width: 0.4 ms
Lead Channel Setting Sensing Sensitivity: 1.2 mV
Zone Setting Status: 755011

## 2023-05-12 DIAGNOSIS — I69391 Dysphagia following cerebral infarction: Secondary | ICD-10-CM | POA: Diagnosis not present

## 2023-05-12 DIAGNOSIS — M6281 Muscle weakness (generalized): Secondary | ICD-10-CM | POA: Diagnosis not present

## 2023-05-13 DIAGNOSIS — I69391 Dysphagia following cerebral infarction: Secondary | ICD-10-CM | POA: Diagnosis not present

## 2023-05-13 DIAGNOSIS — M6281 Muscle weakness (generalized): Secondary | ICD-10-CM | POA: Diagnosis not present

## 2023-05-14 DIAGNOSIS — M6281 Muscle weakness (generalized): Secondary | ICD-10-CM | POA: Diagnosis not present

## 2023-05-14 DIAGNOSIS — I69391 Dysphagia following cerebral infarction: Secondary | ICD-10-CM | POA: Diagnosis not present

## 2023-05-19 DIAGNOSIS — I69391 Dysphagia following cerebral infarction: Secondary | ICD-10-CM | POA: Diagnosis not present

## 2023-05-19 DIAGNOSIS — M6281 Muscle weakness (generalized): Secondary | ICD-10-CM | POA: Diagnosis not present

## 2023-05-22 DIAGNOSIS — I69391 Dysphagia following cerebral infarction: Secondary | ICD-10-CM | POA: Diagnosis not present

## 2023-05-27 DIAGNOSIS — I69120 Aphasia following nontraumatic intracerebral hemorrhage: Secondary | ICD-10-CM | POA: Diagnosis not present

## 2023-05-27 DIAGNOSIS — N183 Chronic kidney disease, stage 3 unspecified: Secondary | ICD-10-CM | POA: Diagnosis not present

## 2023-05-27 DIAGNOSIS — I619 Nontraumatic intracerebral hemorrhage, unspecified: Secondary | ICD-10-CM | POA: Diagnosis not present

## 2023-05-27 DIAGNOSIS — Z515 Encounter for palliative care: Secondary | ICD-10-CM | POA: Diagnosis not present

## 2023-05-27 DIAGNOSIS — I6990 Unspecified sequelae of unspecified cerebrovascular disease: Secondary | ICD-10-CM | POA: Diagnosis not present

## 2023-05-27 DIAGNOSIS — G8191 Hemiplegia, unspecified affecting right dominant side: Secondary | ICD-10-CM | POA: Diagnosis not present

## 2023-05-27 DIAGNOSIS — Z960 Presence of urogenital implants: Secondary | ICD-10-CM | POA: Diagnosis not present

## 2023-06-07 DIAGNOSIS — Z20822 Contact with and (suspected) exposure to covid-19: Secondary | ICD-10-CM | POA: Diagnosis not present

## 2023-06-07 DIAGNOSIS — I69322 Dysarthria following cerebral infarction: Secondary | ICD-10-CM | POA: Diagnosis not present

## 2023-06-07 DIAGNOSIS — Z960 Presence of urogenital implants: Secondary | ICD-10-CM | POA: Diagnosis not present

## 2023-06-07 DIAGNOSIS — Z79899 Other long term (current) drug therapy: Secondary | ICD-10-CM | POA: Diagnosis not present

## 2023-06-07 DIAGNOSIS — T83011A Breakdown (mechanical) of indwelling urethral catheter, initial encounter: Secondary | ICD-10-CM | POA: Diagnosis not present

## 2023-06-07 DIAGNOSIS — N390 Urinary tract infection, site not specified: Secondary | ICD-10-CM | POA: Diagnosis not present

## 2023-06-07 DIAGNOSIS — N179 Acute kidney failure, unspecified: Secondary | ICD-10-CM | POA: Diagnosis not present

## 2023-06-07 DIAGNOSIS — A419 Sepsis, unspecified organism: Secondary | ICD-10-CM | POA: Diagnosis not present

## 2023-06-07 DIAGNOSIS — T83091A Other mechanical complication of indwelling urethral catheter, initial encounter: Secondary | ICD-10-CM | POA: Diagnosis not present

## 2023-06-07 DIAGNOSIS — K5730 Diverticulosis of large intestine without perforation or abscess without bleeding: Secondary | ICD-10-CM | POA: Diagnosis not present

## 2023-06-07 DIAGNOSIS — E1122 Type 2 diabetes mellitus with diabetic chronic kidney disease: Secondary | ICD-10-CM | POA: Diagnosis not present

## 2023-06-07 DIAGNOSIS — R339 Retention of urine, unspecified: Secondary | ICD-10-CM | POA: Diagnosis not present

## 2023-06-07 DIAGNOSIS — N189 Chronic kidney disease, unspecified: Secondary | ICD-10-CM | POA: Diagnosis not present

## 2023-06-07 DIAGNOSIS — M109 Gout, unspecified: Secondary | ICD-10-CM | POA: Diagnosis not present

## 2023-06-07 DIAGNOSIS — T83511A Infection and inflammatory reaction due to indwelling urethral catheter, initial encounter: Secondary | ICD-10-CM | POA: Diagnosis not present

## 2023-06-07 DIAGNOSIS — E1165 Type 2 diabetes mellitus with hyperglycemia: Secondary | ICD-10-CM | POA: Diagnosis not present

## 2023-06-07 DIAGNOSIS — E119 Type 2 diabetes mellitus without complications: Secondary | ICD-10-CM | POA: Diagnosis not present

## 2023-06-07 DIAGNOSIS — Z8673 Personal history of transient ischemic attack (TIA), and cerebral infarction without residual deficits: Secondary | ICD-10-CM | POA: Diagnosis not present

## 2023-06-07 DIAGNOSIS — I129 Hypertensive chronic kidney disease with stage 1 through stage 4 chronic kidney disease, or unspecified chronic kidney disease: Secondary | ICD-10-CM | POA: Diagnosis not present

## 2023-06-07 DIAGNOSIS — I639 Cerebral infarction, unspecified: Secondary | ICD-10-CM | POA: Diagnosis not present

## 2023-06-13 ENCOUNTER — Telehealth: Payer: Self-pay

## 2023-06-13 DIAGNOSIS — N179 Acute kidney failure, unspecified: Secondary | ICD-10-CM | POA: Diagnosis not present

## 2023-06-13 DIAGNOSIS — I509 Heart failure, unspecified: Secondary | ICD-10-CM | POA: Diagnosis not present

## 2023-06-13 DIAGNOSIS — I110 Hypertensive heart disease with heart failure: Secondary | ICD-10-CM | POA: Diagnosis not present

## 2023-06-13 DIAGNOSIS — L89212 Pressure ulcer of right hip, stage 2: Secondary | ICD-10-CM | POA: Diagnosis not present

## 2023-06-13 DIAGNOSIS — E119 Type 2 diabetes mellitus without complications: Secondary | ICD-10-CM | POA: Diagnosis not present

## 2023-06-13 DIAGNOSIS — Z466 Encounter for fitting and adjustment of urinary device: Secondary | ICD-10-CM | POA: Diagnosis not present

## 2023-06-13 DIAGNOSIS — I480 Paroxysmal atrial fibrillation: Secondary | ICD-10-CM | POA: Diagnosis not present

## 2023-06-13 DIAGNOSIS — Z435 Encounter for attention to cystostomy: Secondary | ICD-10-CM | POA: Diagnosis not present

## 2023-06-13 DIAGNOSIS — A419 Sepsis, unspecified organism: Secondary | ICD-10-CM | POA: Diagnosis not present

## 2023-06-13 NOTE — Transitions of Care (Post Inpatient/ED Visit) (Signed)
   06/13/2023  Name: Jakari Sada MRN: [id number] DOB: [birth year]  Today's TOC FU Call Status: Today's TOC FU Call Status:: Unsuccessful Call (1st Attempt) Unsuccessful Call (1st Attempt) Date: 06/13/23  Attempted to reach the patient regarding the most recent Inpatient/ED visit. Spoke with Merna Aase, who asked for a call back on Tuesday as Home Health in the home completing evaluation.    Follow Up Plan: Additional outreach attempts will be made to reach the patient to complete the Transitions of Care (Post Inpatient/ED visit) call.   Cellie Dardis J. Any Mcneice RN, MSN [facility], [facility] RN Care Manager Direct Dial: [phone]  Fax: [phone] Website: [email or web address]

## 2023-06-17 ENCOUNTER — Telehealth: Payer: Self-pay

## 2023-06-17 DIAGNOSIS — I110 Hypertensive heart disease with heart failure: Secondary | ICD-10-CM | POA: Diagnosis not present

## 2023-06-17 DIAGNOSIS — E119 Type 2 diabetes mellitus without complications: Secondary | ICD-10-CM | POA: Diagnosis not present

## 2023-06-17 DIAGNOSIS — Z435 Encounter for attention to cystostomy: Secondary | ICD-10-CM | POA: Diagnosis not present

## 2023-06-17 DIAGNOSIS — N179 Acute kidney failure, unspecified: Secondary | ICD-10-CM | POA: Diagnosis not present

## 2023-06-17 DIAGNOSIS — I509 Heart failure, unspecified: Secondary | ICD-10-CM | POA: Diagnosis not present

## 2023-06-17 DIAGNOSIS — Z466 Encounter for fitting and adjustment of urinary device: Secondary | ICD-10-CM | POA: Diagnosis not present

## 2023-06-17 DIAGNOSIS — A419 Sepsis, unspecified organism: Secondary | ICD-10-CM | POA: Diagnosis not present

## 2023-06-17 DIAGNOSIS — L89212 Pressure ulcer of right hip, stage 2: Secondary | ICD-10-CM | POA: Diagnosis not present

## 2023-06-17 DIAGNOSIS — I480 Paroxysmal atrial fibrillation: Secondary | ICD-10-CM | POA: Diagnosis not present

## 2023-06-17 NOTE — Telephone Encounter (Signed)
 Copied from CRM [id number]. Topic: Clinical - Home Health Verbal Orders >> Jun 17, 2023 11:08 AM Carlatta H wrote: Caller/Agency: Mahlon Schwab Number: [phone] Service Requested: Skilled Nursing Frequency: twice a week for 2 weeks/1 a week for 2 weeks//everyother week for 2 weeks Any new concerns about the patient? No

## 2023-06-17 NOTE — Telephone Encounter (Signed)
 Verbal orders provided.

## 2023-06-18 ENCOUNTER — Telehealth: Payer: Self-pay

## 2023-06-18 DIAGNOSIS — Z466 Encounter for fitting and adjustment of urinary device: Secondary | ICD-10-CM | POA: Diagnosis not present

## 2023-06-18 DIAGNOSIS — Z435 Encounter for attention to cystostomy: Secondary | ICD-10-CM | POA: Diagnosis not present

## 2023-06-18 DIAGNOSIS — I110 Hypertensive heart disease with heart failure: Secondary | ICD-10-CM | POA: Diagnosis not present

## 2023-06-18 DIAGNOSIS — E119 Type 2 diabetes mellitus without complications: Secondary | ICD-10-CM | POA: Diagnosis not present

## 2023-06-18 DIAGNOSIS — L89212 Pressure ulcer of right hip, stage 2: Secondary | ICD-10-CM | POA: Diagnosis not present

## 2023-06-18 DIAGNOSIS — I509 Heart failure, unspecified: Secondary | ICD-10-CM | POA: Diagnosis not present

## 2023-06-18 DIAGNOSIS — A419 Sepsis, unspecified organism: Secondary | ICD-10-CM | POA: Diagnosis not present

## 2023-06-18 DIAGNOSIS — I480 Paroxysmal atrial fibrillation: Secondary | ICD-10-CM | POA: Diagnosis not present

## 2023-06-18 DIAGNOSIS — N179 Acute kidney failure, unspecified: Secondary | ICD-10-CM | POA: Diagnosis not present

## 2023-06-18 NOTE — Addendum Note (Signed)
 Addended by: Lott Rouleau A on: 06/18/2023 01:11 PM   Modules accepted: Orders

## 2023-06-18 NOTE — Transitions of Care (Post Inpatient/ED Visit) (Signed)
   06/18/2023  Name: Joshue Badal MRN: [id number] DOB: 10/19/1945  Today's TOC FU Call Status: Today's TOC FU Call Status::  (Spoke with wife who states a wound nurse, SW, and another nurse from [place] have already come and has custodial with Medicaid and declined TOC program calls) TOC FU Call Complete Date: 06/18/23 Patient's Name and Date of Birth confirmed.  Tonia Frankel RN, CCM   [facility] RN Care Manager [phone]

## 2023-06-18 NOTE — Progress Notes (Signed)
 Remote pacemaker transmission.

## 2023-06-19 ENCOUNTER — Telehealth: Payer: Self-pay | Admitting: Family Medicine

## 2023-06-19 DIAGNOSIS — Z435 Encounter for attention to cystostomy: Secondary | ICD-10-CM | POA: Diagnosis not present

## 2023-06-19 DIAGNOSIS — I509 Heart failure, unspecified: Secondary | ICD-10-CM | POA: Diagnosis not present

## 2023-06-19 DIAGNOSIS — Z466 Encounter for fitting and adjustment of urinary device: Secondary | ICD-10-CM | POA: Diagnosis not present

## 2023-06-19 DIAGNOSIS — L89212 Pressure ulcer of right hip, stage 2: Secondary | ICD-10-CM | POA: Diagnosis not present

## 2023-06-19 DIAGNOSIS — I110 Hypertensive heart disease with heart failure: Secondary | ICD-10-CM | POA: Diagnosis not present

## 2023-06-19 DIAGNOSIS — A419 Sepsis, unspecified organism: Secondary | ICD-10-CM | POA: Diagnosis not present

## 2023-06-19 DIAGNOSIS — E119 Type 2 diabetes mellitus without complications: Secondary | ICD-10-CM | POA: Diagnosis not present

## 2023-06-19 DIAGNOSIS — N179 Acute kidney failure, unspecified: Secondary | ICD-10-CM | POA: Diagnosis not present

## 2023-06-19 DIAGNOSIS — I480 Paroxysmal atrial fibrillation: Secondary | ICD-10-CM | POA: Diagnosis not present

## 2023-06-19 NOTE — Telephone Encounter (Signed)
 Copied from CRM [id number]. Topic: General - Transportation >> Jun 19, 2023 10:04 AM Crispin Dolphin wrote: Reason for CRM: Patient wife called. Patient has appt tomorrow. She stated [facility] wants to reschedule or June 6th but caller needs to keep appt because patient needs to be seen for home health orders. [facility] needs fax from provider stating patient needs to keep appt. Spoke with Noreen Beard at Fax #: [phone]. Thank You

## 2023-06-19 NOTE — Telephone Encounter (Signed)
 Letter sent to [facility] that he must keep appt on 06/20/23

## 2023-06-19 NOTE — Telephone Encounter (Signed)
 [facility] forms  Noted Copied Sleeved  Original copy placed in provider box  Copy at front desk folder  Fax to [phone]

## 2023-06-20 ENCOUNTER — Ambulatory Visit: Payer: Self-pay | Admitting: Family Medicine

## 2023-06-20 ENCOUNTER — Telehealth: Payer: Self-pay

## 2023-06-20 ENCOUNTER — Telehealth: Payer: Self-pay | Admitting: Family Medicine

## 2023-06-20 VITALS — BP 140/84

## 2023-06-20 DIAGNOSIS — N179 Acute kidney failure, unspecified: Secondary | ICD-10-CM | POA: Diagnosis not present

## 2023-06-20 DIAGNOSIS — A419 Sepsis, unspecified organism: Secondary | ICD-10-CM | POA: Diagnosis not present

## 2023-06-20 DIAGNOSIS — Z466 Encounter for fitting and adjustment of urinary device: Secondary | ICD-10-CM | POA: Diagnosis not present

## 2023-06-20 DIAGNOSIS — I110 Hypertensive heart disease with heart failure: Secondary | ICD-10-CM | POA: Diagnosis not present

## 2023-06-20 DIAGNOSIS — L89212 Pressure ulcer of right hip, stage 2: Secondary | ICD-10-CM | POA: Diagnosis not present

## 2023-06-20 DIAGNOSIS — Z09 Encounter for follow-up examination after completed treatment for conditions other than malignant neoplasm: Secondary | ICD-10-CM | POA: Insufficient documentation

## 2023-06-20 DIAGNOSIS — R399 Unspecified symptoms and signs involving the genitourinary system: Secondary | ICD-10-CM

## 2023-06-20 DIAGNOSIS — Z435 Encounter for attention to cystostomy: Secondary | ICD-10-CM | POA: Diagnosis not present

## 2023-06-20 DIAGNOSIS — E878 Other disorders of electrolyte and fluid balance, not elsewhere classified: Secondary | ICD-10-CM

## 2023-06-20 DIAGNOSIS — I509 Heart failure, unspecified: Secondary | ICD-10-CM | POA: Diagnosis not present

## 2023-06-20 DIAGNOSIS — I480 Paroxysmal atrial fibrillation: Secondary | ICD-10-CM | POA: Diagnosis not present

## 2023-06-20 DIAGNOSIS — E119 Type 2 diabetes mellitus without complications: Secondary | ICD-10-CM | POA: Diagnosis not present

## 2023-06-20 MED ORDER — ONDANSETRON HCL 4 MG PO TABS: 4.0000 mg | ORAL_TABLET | Freq: Three times a day (TID) | ORAL | 0 refills | Status: DC | PRN

## 2023-06-20 MED ORDER — ALLOPURINOL 100 MG PO TABS: 100.0000 mg | ORAL_TABLET | Freq: Every day | ORAL | 1 refills | Status: DC

## 2023-06-20 MED ORDER — FAMOTIDINE 20 MG PO TABS: 20.0000 mg | ORAL_TABLET | Freq: Every day | ORAL | 1 refills | Status: DC

## 2023-06-20 MED ORDER — METOPROLOL SUCCINATE ER 25 MG PO TB24: 12.5000 mg | ORAL_TABLET | Freq: Two times a day (BID) | ORAL | 1 refills | Status: DC

## 2023-06-20 MED ORDER — GABAPENTIN 100 MG PO CAPS: 100.0000 mg | ORAL_CAPSULE | Freq: Two times a day (BID) | ORAL | 1 refills | Status: DC

## 2023-06-20 MED ORDER — SIMVASTATIN 40 MG PO TABS: 40.0000 mg | ORAL_TABLET | Freq: Every day | ORAL | 1 refills | Status: DC

## 2023-06-20 MED ORDER — BACLOFEN 5 MG PO TABS: 5.0000 mg | ORAL_TABLET | Freq: Three times a day (TID) | ORAL | 1 refills | Status: DC

## 2023-06-20 MED ORDER — TRAZODONE HCL 50 MG PO TABS: 50.0000 mg | ORAL_TABLET | Freq: Every evening | ORAL | 1 refills | Status: DC | PRN

## 2023-06-20 MED ORDER — ONDANSETRON HCL 4 MG PO TABS: 4.0000 mg | ORAL_TABLET | Freq: Two times a day (BID) | ORAL | 1 refills | Status: AC | PRN

## 2023-06-20 MED ORDER — FLUOXETINE HCL 20 MG PO CAPS: 20.0000 mg | ORAL_CAPSULE | Freq: Every day | ORAL | 1 refills | Status: DC

## 2023-06-20 NOTE — Telephone Encounter (Signed)
 Copied from CRM [id number]. Topic: Clinical - Home Health Verbal Orders >> Jun 20, 2023  2:33 PM Tiffany S wrote: Caller/Agency: Wilnette Haste Number: [phone] confidential line  Service Requested: Physical Therapy Frequency: 1 week 4  Any new concerns about the patient? No

## 2023-06-20 NOTE — Assessment & Plan Note (Addendum)
 UA, BMP and CBC labs ordered. The hospital chart, including the discharge summary, was thoroughly reviewed Medications were thoroughly reviewed and reconciled with the patient family member. Patient needs PCP follow for management of chronic conditions. Medications refill provided

## 2023-06-20 NOTE — Telephone Encounter (Signed)
Verbal orders given to Stacy 

## 2023-06-20 NOTE — Telephone Encounter (Signed)
 Patient was seen in office today. Patients wife expressed she needs thickener sent in for him. Either powder or liquid   [facility] (On File)

## 2023-06-20 NOTE — Telephone Encounter (Signed)
 Completed and waiting for Art Bigness to sign off

## 2023-06-20 NOTE — Patient Instructions (Signed)

## 2023-06-20 NOTE — Progress Notes (Signed)
 Established Patient Office Visit   Subjective  Patient ID: Dylan Martin, male    DOB: October 04, 1945  Age: 78 y.o. MRN: [id number]  Chief Complaint  Patient presents with   Hospitalization Follow-up    He  has a past medical history of Aneurysm of other specified arteries (HCC), Aphasia following cerebral infarction, Atherosclerotic heart disease of native coronary artery without angina pectoris, Benign prostatic hyperplasia with lower urinary tract symptoms, Benign prostatic hyperplasia without lower urinary tract symptoms, Bradycardia, unspecified, Cerebral infarction (HCC), Chronic kidney disease, stage 3 unspecified (HCC), Cognitive communication deficit, Dysarthria, Dysphagia, Encephalopathy, Essential (primary) hypertension, Gastro-esophageal reflux disease without esophagitis, 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HCC), Hemiplegia (HCC), 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), Hyperlipidemia, Hyperosmolality and hypernatremia, Muscle weakness (generalized), Myocardial infarct, old, Neuromuscular dysfunction of bladder, unspecified, Nontraumatic intracerebral hemorrhage (HCC), Nontraumatic intracerebral hemorrhage (HCC), Obstructive sleep apnea, Paroxysmal atrial fibrillation (HCC), Presence of cardiac pacemaker, Presence of coronary angioplasty implant and graft, Pressure ulcer, Renovascular hypertension, Retention of urine, Seizures (HCC), TIA (transient ischemic attack), Type 2 diabetes mellitus without complication (HCC), Unspecified atrial fibrillation (HCC), Unspecified disorder of synovium and tendon, right shoulder, Unspecified lack of coordination, and Weakness.  HPI Patient presents to the clinic for hospital follow up.Patient hx neurogenic bladder with chronic suprapubic catheter, and bilateral renal neoplasms, presented from [facility] with decreased urine output. In the ED, catheter malfunction was identified and replaced. Labs revealed  AKI (Cr 3.86) and sepsis likely secondary to CAUTI (WBC 18K, lactate 4.6). Urine culture showed Proteus and mixed organisms; patient treated with IV cefepime  and vancomycin , transitioned to oral cephalosporin on discharge. Renal function returned to baseline (Cr 1.63). No home oxygen or anticoagulation. 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 Chronic Foley use noted; follow-up with urology and oncology recommended for known renal mass. Disposition: Discharged home per family's wishes. Continue hydration, aspiration precautions, carb-controlled diet, and home PT. No acute pulmonary issues on exam. Catheter draining well; no abdominal tenderness. Review of Systems  Constitutional:  Negative for chills and fever.  Respiratory:  Negative for shortness of breath.   Cardiovascular:  Negative for chest pain.  Genitourinary:  Negative for hematuria.      Objective:     BP (!) 140/84  BP Readings from Last 3 Encounters:  06/20/23 (!) 140/84  12/06/22 (!) 150/91  09/17/22 124/85      Physical Exam Vitals reviewed.  Constitutional:      General: He is not in acute distress.    Appearance: Normal appearance. He is not ill-appearing, toxic-appearing or diaphoretic.  HENT:     Head: Normocephalic.  Eyes:     General:        Right eye: No discharge.        Left eye: No discharge.     Conjunctiva/sclera: Conjunctivae normal.  Cardiovascular:     Rate and Rhythm: Normal rate.     Pulses: Normal pulses.     Heart sounds: Normal heart sounds.  Pulmonary:     Effort: Pulmonary effort is normal. No respiratory distress.     Breath sounds: Normal breath sounds.  Skin:    General: Skin is warm and dry.  Neurological:     Coordination: Coordination normal.     Gait: Gait normal.  Psychiatric:        Mood and Affect: Mood normal.        Behavior: Behavior normal.      No results found for any visits on 06/20/23.  The ASCVD Risk  score (Arnett DK, et al., 2019) failed to calculate for the  following reasons:   The 2019 ASCVD risk score is only valid for ages 41 to 18   Risk score cannot be calculated because patient has a medical history suggesting prior/existing ASCVD    Assessment & Plan:  Hospital discharge follow-up Assessment & Plan: UA, BMP and CBC labs ordered. The hospital chart, including the discharge summary, was thoroughly reviewed Medications were thoroughly reviewed and reconciled with the patient family member. Patient needs PCP follow for management of chronic conditions. Medications refill provided    Electrolyte imbalance -     BMP8+eGFR -     CBC with Differential/Platelet  Urinary symptom or sign -     UA/M w/rflx Culture, Routine  Other orders -     Famotidine ; Take 1 tablet (20 mg total) by mouth daily.  Dispense: 30 tablet; Refill: 1 -     Allopurinol; Take 1 tablet (100 mg total) by mouth daily.  Dispense: 30 tablet; Refill: 1 -     Baclofen; Take 1 tablet (5 mg total) by mouth 3 (three) times daily.  Dispense: 90 tablet; Refill: 1 -     Famotidine ; Take 1 tablet (20 mg total) by mouth daily.  Dispense: 30 tablet; Refill: 1 -     FLUoxetine  HCl; Take 1 capsule (20 mg total) by mouth daily.  Dispense: 30 capsule; Refill: 1 -     Gabapentin; Take 1 capsule (100 mg total) by mouth 2 (two) times daily.  Dispense: 60 capsule; Refill: 1 -     Metoprolol  Succinate ER; Take 0.5 tablets (12.5 mg total) by mouth 2 (two) times daily.  Dispense: 60 tablet; Refill: 1 -     Simvastatin ; Take 1 tablet (40 mg total) by mouth daily at 6 PM.  Dispense: 30 tablet; Refill: 1 -     Ondansetron  HCl; Take 1 tablet (4 mg total) by mouth every 8 (eight) hours as needed for nausea or vomiting.  Dispense: 20 tablet; Refill: 0 -     Ondansetron  HCl; Take 1 tablet (4 mg total) by mouth every 12 (twelve) hours as needed for vomiting or nausea.  Dispense: 30 tablet; Refill: 1 -     traZODone HCl; Take 1 tablet (50 mg total) by mouth at bedtime as needed.  Dispense: 30 tablet;  Refill: 1    Return if symptoms worsen or fail to improve, for Needs PCP Follow up for chronic condtions next avaible appointment .   Avelino Lek Amber Bail, FNP

## 2023-06-23 ENCOUNTER — Ambulatory Visit: Payer: Self-pay

## 2023-06-23 ENCOUNTER — Encounter (HOSPITAL_COMMUNITY): Payer: Self-pay | Admitting: Emergency Medicine

## 2023-06-23 ENCOUNTER — Emergency Department (HOSPITAL_COMMUNITY)
Admission: EM | Admit: 2023-06-23 | Discharge: 2023-06-23 | Disposition: A | Discharge status: Home / Self Care | Attending: Emergency Medicine | Admitting: Emergency Medicine

## 2023-06-23 ENCOUNTER — Telehealth: Payer: Self-pay | Admitting: Family Medicine

## 2023-06-23 ENCOUNTER — Other Ambulatory Visit: Payer: Self-pay

## 2023-06-23 DIAGNOSIS — Z466 Encounter for fitting and adjustment of urinary device: Secondary | ICD-10-CM | POA: Diagnosis not present

## 2023-06-23 DIAGNOSIS — A419 Sepsis, unspecified organism: Secondary | ICD-10-CM | POA: Diagnosis not present

## 2023-06-23 DIAGNOSIS — Z435 Encounter for attention to cystostomy: Secondary | ICD-10-CM | POA: Diagnosis not present

## 2023-06-23 DIAGNOSIS — R6889 Other general symptoms and signs: Secondary | ICD-10-CM | POA: Diagnosis not present

## 2023-06-23 DIAGNOSIS — I480 Paroxysmal atrial fibrillation: Secondary | ICD-10-CM | POA: Diagnosis not present

## 2023-06-23 DIAGNOSIS — L89212 Pressure ulcer of right hip, stage 2: Secondary | ICD-10-CM | POA: Diagnosis not present

## 2023-06-23 DIAGNOSIS — I160 Hypertensive urgency: Secondary | ICD-10-CM | POA: Insufficient documentation

## 2023-06-23 DIAGNOSIS — N179 Acute kidney failure, unspecified: Secondary | ICD-10-CM | POA: Diagnosis not present

## 2023-06-23 DIAGNOSIS — E119 Type 2 diabetes mellitus without complications: Secondary | ICD-10-CM | POA: Diagnosis not present

## 2023-06-23 DIAGNOSIS — I509 Heart failure, unspecified: Secondary | ICD-10-CM | POA: Diagnosis not present

## 2023-06-23 DIAGNOSIS — I110 Hypertensive heart disease with heart failure: Secondary | ICD-10-CM | POA: Diagnosis not present

## 2023-06-23 DIAGNOSIS — I10 Essential (primary) hypertension: Secondary | ICD-10-CM | POA: Diagnosis not present

## 2023-06-23 LAB — CBC WITH DIFFERENTIAL/PLATELET
Abs Immature Granulocytes: 0.03 10*3/uL (ref 0.00–0.07)
Basophils Absolute: 0.1 10*3/uL (ref 0.0–0.2)
Basophils Absolute: 0.1 10*3/uL (ref 0.0–0.1)
Basophils Relative: 1 %
Basos: 1 %
EOS (ABSOLUTE): 0.2 10*3/uL (ref 0.0–0.4)
Eos: 2 %
Eosinophils Absolute: 0.2 10*3/uL (ref 0.0–0.5)
Eosinophils Relative: 2 %
HCT: 36.9 % — ABNORMAL LOW (ref 39.0–52.0)
Hematocrit: 41.8 % (ref 37.5–51.0)
Hemoglobin: 13.4 g/dL (ref 13.0–17.7)
Hemoglobin: 12.1 g/dL — ABNORMAL LOW (ref 13.0–17.0)
Immature Grans (Abs): 0.2 10*3/uL — ABNORMAL HIGH (ref 0.0–0.1)
Immature Granulocytes: 2 %
Immature Granulocytes: 0 %
Lymphocytes Absolute: 1.9 10*3/uL (ref 0.7–3.1)
Lymphocytes Relative: 26 %
Lymphs Abs: 1.9 10*3/uL (ref 0.7–4.0)
Lymphs: 20 %
MCH: 29.3 pg (ref 26.6–33.0)
MCH: 30.1 pg (ref 26.0–34.0)
MCHC: 32.1 g/dL (ref 31.5–35.7)
MCHC: 32.8 g/dL (ref 30.0–36.0)
MCV: 92 fL (ref 79–97)
MCV: 91.8 fL (ref 80.0–100.0)
Monocytes Absolute: 0.6 10*3/uL (ref 0.1–0.9)
Monocytes Absolute: 0.7 10*3/uL (ref 0.1–1.0)
Monocytes Relative: 9 %
Monocytes: 6 %
Neutro Abs: 4.5 10*3/uL (ref 1.7–7.7)
Neutrophils Absolute: 6.6 10*3/uL (ref 1.4–7.0)
Neutrophils Relative %: 62 %
Neutrophils: 69 %
Platelets: 293 10*3/uL (ref 150–450)
Platelets: 297 10*3/uL (ref 150–400)
RBC: 4.57 x10E6/uL (ref 4.14–5.80)
RBC: 4.02 MIL/uL — ABNORMAL LOW (ref 4.22–5.81)
RDW: 13 % (ref 11.6–15.4)
RDW: 14.2 % (ref 11.5–15.5)
WBC: 9.4 10*3/uL (ref 3.4–10.8)
WBC: 7.3 10*3/uL (ref 4.0–10.5)
nRBC: 0 % (ref 0.0–0.2)

## 2023-06-23 LAB — UA/M W/RFLX CULTURE, ROUTINE
Bilirubin, UA: NEGATIVE
Glucose, UA: NEGATIVE
Ketones, UA: NEGATIVE
Leukocytes,UA: NEGATIVE
Nitrite, UA: NEGATIVE
RBC, UA: NEGATIVE
Specific Gravity, UA: 1.018 (ref 1.005–1.030)
Urobilinogen, Ur: 0.2 mg/dL (ref 0.2–1.0)
pH, UA: 6 (ref 5.0–7.5)

## 2023-06-23 LAB — BMP8+EGFR
BUN/Creatinine Ratio: 18 (ref 10–24)
BUN: 26 mg/dL (ref 8–27)
CO2: 24 mmol/L (ref 20–29)
Calcium: 9.9 mg/dL (ref 8.6–10.2)
Chloride: 99 mmol/L (ref 96–106)
Creatinine, Ser: 1.41 mg/dL — ABNORMAL HIGH (ref 0.76–1.27)
Glucose: 88 mg/dL (ref 70–99)
Potassium: 4.1 mmol/L (ref 3.5–5.2)
Sodium: 138 mmol/L (ref 134–144)
eGFR: 50 mL/min/{1.73_m2} — ABNORMAL LOW (ref >59)

## 2023-06-23 LAB — URINALYSIS, ROUTINE W REFLEX MICROSCOPIC
Bacteria, UA: NONE SEEN
Bilirubin Urine: NEGATIVE
Glucose, UA: NEGATIVE mg/dL
Hgb urine dipstick: NEGATIVE
Ketones, ur: NEGATIVE mg/dL
Leukocytes,Ua: NEGATIVE
Nitrite: NEGATIVE
Protein, ur: 30 mg/dL — AB
Specific Gravity, Urine: 1.009 (ref 1.005–1.030)
pH: 7 (ref 5.0–8.0)

## 2023-06-23 LAB — COMPREHENSIVE METABOLIC PANEL WITH GFR
ALT: 10 U/L (ref 0–44)
AST: 11 U/L — ABNORMAL LOW (ref 15–41)
Albumin: 2.8 g/dL — ABNORMAL LOW (ref 3.5–5.0)
Alkaline Phosphatase: 98 U/L (ref 38–126)
Anion gap: 6 (ref 5–15)
BUN: 26 mg/dL — ABNORMAL HIGH (ref 8–23)
CO2: 28 mmol/L (ref 22–32)
Calcium: 9.1 mg/dL (ref 8.9–10.3)
Chloride: 102 mmol/L (ref 98–111)
Creatinine, Ser: 1.27 mg/dL — ABNORMAL HIGH (ref 0.61–1.24)
GFR, Estimated: 57 mL/min — ABNORMAL LOW (ref >60)
Glucose, Bld: 115 mg/dL — ABNORMAL HIGH (ref 70–99)
Potassium: 3.3 mmol/L — ABNORMAL LOW (ref 3.5–5.1)
Sodium: 136 mmol/L (ref 135–145)
Total Bilirubin: 0.3 mg/dL (ref 0.0–1.2)
Total Protein: 6.9 g/dL (ref 6.5–8.1)

## 2023-06-23 LAB — MICROSCOPIC EXAMINATION
Bacteria, UA: NONE SEEN
Casts: NONE SEEN /LPF
RBC, Urine: NONE SEEN /HPF (ref 0–2)

## 2023-06-23 LAB — URINE CULTURE, REFLEX: Organism ID, Bacteria: NO GROWTH

## 2023-06-23 MED ORDER — AMLODIPINE BESYLATE 5 MG PO TABS: 5.0000 mg | ORAL_TABLET | Freq: Every day | ORAL | 0 refills | Status: DC

## 2023-06-23 MED ORDER — AMLODIPINE BESYLATE 5 MG PO TABS
5.0000 mg | ORAL_TABLET | Freq: Once | ORAL | Status: AC
  Administered 2023-06-23: 5 mg via ORAL
  Filled 2023-06-23: qty 1

## 2023-06-23 NOTE — ED Notes (Signed)
 AC to bring apple sauce for pt to take PO meds

## 2023-06-23 NOTE — ED Triage Notes (Signed)
 Pt bib [facility] for hypertension. Per EMS pt was told to come by PCP for a bp. Reported to be 170s systolic at home.

## 2023-06-23 NOTE — ED Provider Notes (Signed)
 [place] [facility]ERGENCY DEPARTMENT AT [facility] Provider Note   CSN: [id number] Arrival date & time: 06/23/23  1345     History  Chief Complaint  Patient presents with   Hypertension    Dylan Martin is a 78 y.o. male.  HPI Presents the behest of his primary care physician.  Patient has a history of prior stroke, bedbound, with substantial contracture.  Reportedly patient was at home, found to be hypotensive and sent here for evaluation.  Patient has minimal communicative ability.  Notes are from OT who found the patient to be hypertensive, reportedly 180 systolic, subsequently 160 systolic, prompting transfer.  No report in change from baseline interactivity patient seemingly takes metoprolol  regularly.    Home Medications Prior to Admission medications   Medication Sig Start Date End Date Taking? Authorizing Provider  amLODipine (NORVASC) 5 MG tablet Take 1 tablet (5 mg total) by mouth daily. 06/23/23  Yes Dorenda Gandy, MD  acetaminophen  (TYLENOL ) 325 MG tablet Take 650 mg by mouth every 4 (four) hours as needed for mild pain or fever.    [provider]  allopurinol  (ZYLOPRIM ) 100 MG tablet Take 1 tablet (100 mg total) by mouth daily. 06/20/23   Del Amber Bail, Rogerio Clay, FNP  AMBULATORY NON FORMULARY MEDICATION 1 Device by Does not apply route at bedtime. BiPap    [provider]  ascorbic acid (VITAMIN C) 500 MG tablet Take 500 mg by mouth daily.    [provider]  Baclofen  5 MG TABS Take 1 tablet (5 mg total) by mouth 3 (three) times daily. 06/20/23   Del Abron Abt, FNP  bisacodyl  (DULCOLAX) 10 MG suppository Place 10 mg rectally daily as needed (constipation). 11/17/20   [provider]  bisacodyl  (DULCOLAX) 5 MG EC tablet Take 1 tablet (5 mg total) by mouth daily as needed for moderate constipation. 12/06/22   Demaris Fillers, MD  cefdinir  (OMNICEF ) 300 MG capsule Take 1 capsule (300 mg total) by mouth every 12 (twelve) hours.  12/07/22   Demaris Fillers, MD  famotidine  (PEPCID ) 20 MG tablet Take 1 tablet (20 mg total) by mouth daily. 06/20/23   Del Orbe Polanco, Iliana, FNP  famotidine  (PEPCID ) 20 MG tablet Take 1 tablet (20 mg total) by mouth daily. 06/20/23   Del Abron Abt, FNP  feeding supplement (ENSURE ENLIVE / ENSURE PLUS) LIQD Take 237 mLs by mouth 2 (two) times daily between meals. 12/06/22   Demaris Fillers, MD  fexofenadine (ALLEGRA) 180 MG tablet Take 180 mg by mouth daily as needed for allergies or rhinitis.    [provider]  FLUoxetine  (PROZAC ) 20 MG capsule Take 1 capsule (20 mg total) by mouth daily. 06/20/23   Del Abron Abt, FNP  gabapentin  (NEURONTIN ) 100 MG capsule Take 1 capsule (100 mg total) by mouth 2 (two) times daily. 06/20/23   Del Abron Abt, FNP  magic mouthwash w/lidocaine  SOLN Take 5 mLs by mouth 3 (three) times daily. Suspension contains equal amounts of Maalox Extra Strength, nystatin , diphenhydramine and lidocaine  x 3 days 12/06/22   Tat, Myrtie Atkinson, MD  melatonin 5 MG TABS Take 5 mg by mouth at bedtime.    [provider]  Menthol , Topical Analgesic, 4 % GEL Apply 1 application  topically 2 (two) times daily. To both legs    [provider]  metoprolol  succinate (TOPROL -XL) 25 MG 24 hr tablet Take 0.5 tablets (12.5 mg total) by mouth 2 (two) times daily. 06/20/23   Del  Orbe Polanco, Rogerio Clay, FNP  Multiple Vitamin (MULTIVITAMIN WITH MINERALS) TABS tablet Take 1 tablet by mouth daily. 12/06/22   Demaris Fillers, MD  nitroGLYCERIN  (NITROSTAT ) 0.4 MG SL tablet Place 0.4 mg under the tongue every 5 (five) minutes as needed for chest pain.    [provider]  NON FORMULARY Take 1 Container by mouth 2 (two) times daily. Doctor, hospital, Historical, MD  ondansetron  (ZOFRAN ) 4 MG tablet Take 1 tablet (4 mg total) by mouth every 8 (eight) hours as needed for nausea or vomiting. 06/20/23   Del Amber Bail, Rogerio Clay, FNP  ondansetron  (ZOFRAN ) 4 MG tablet  Take 1 tablet (4 mg total) by mouth every 12 (twelve) hours as needed for vomiting or nausea. 06/20/23   Del Abron Abt, FNP  phosphorus (K PHOS  NEUTRAL) 155-852-130 MG tablet Take 2 tablets (500 mg total) by mouth 2 (two) times daily. X 4 doses 12/06/22   Tat, Myrtie Atkinson, MD  simvastatin  (ZOCOR ) 40 MG tablet Take 1 tablet (40 mg total) by mouth daily at 6 PM. 06/20/23   Del Orbe Polanco, Rogerio Clay, FNP  thiamine 100 MG tablet Take 100 mg by mouth daily. 11/17/20   [provider]  traZODone  (DESYREL ) 50 MG tablet Take 1 tablet (50 mg total) by mouth at bedtime as needed. 06/20/23   Del Abron Abt, FNP      Allergies    Asa [aspirin], Other, and Shrimp (diagnostic)    Review of Systems   Review of Systems  Physical Exam Updated Vital Signs Temp 98 F (36.7 C) (Oral)   Ht 5\' 11"  (1.803 m)   Wt 70.8 kg   BMI 21.76 kg/m  Physical Exam Vitals and nursing note reviewed.  Constitutional:      General: He is not in acute distress.    Appearance: He is well-developed. He is ill-appearing.     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  Eyes:     Conjunctiva/sclera: Conjunctivae normal.  Cardiovascular:     Rate and Rhythm: Normal rate and regular rhythm.  Pulmonary:     Effort: Pulmonary effort is normal. No respiratory distress.     Breath sounds: No stridor.  Abdominal:     General: There is no distension.     Tenderness: There is no abdominal tenderness. There is no guarding.  Genitourinary:    Comments: Foley catheter with clear-ish yellow urine Skin:    General: Skin is warm and dry.     Comments: Stage I decubitus bilateral upper gluteal cleft  Neurological:     Mental Status: He is alert.     Motor: Weakness, atrophy and abnormal muscle tone present.     ED Results / Procedures / Treatments   Labs (all labs ordered are listed, but only abnormal results are displayed) Labs Reviewed  COMPREHENSIVE  METABOLIC PANEL WITH GFR - Abnormal; Notable for the following components:      Result Value   Potassium 3.3 (*)    Glucose, Bld 115 (*)    BUN 26 (*)    Creatinine, Ser 1.27 (*)    Albumin 2.8 (*)    AST 11 (*)    GFR, Estimated 57 (*)    All other components within normal limits  CBC WITH DIFFERENTIAL/PLATELET - Abnormal; Notable for the following components:   RBC 4.02 (*)    Hemoglobin 12.1 (*)    HCT 36.9 (*)    All  other components within normal limits  URINALYSIS, ROUTINE W REFLEX MICROSCOPIC - Abnormal; Notable for the following components:   Color, Urine STRAW (*)    Protein, ur 30 (*)    All other components within normal limits    EKG None  Radiology No results found.  Procedures Procedures    Medications Ordered in ED Medications  amLODipine (NORVASC) tablet 5 mg (has no administration in time range)    ED Course/ Medical Decision Making/ A&P                                 Medical Decision Making Elderly male with multiple medical issues including hypertension and prior stroke presents with hypertension.  Patient is limited communicative capacity is limiting, but he is not overtly distressed, has not complained of anything reportedly to home health care team.  Concern for hypertensive emergency versus urgency versus occult infection.  Labs sent urinalysis sent patient monitored. Patient with paced cardiac rhythm, 70 abnormal Percent room air normal  Amount and/or Complexity of Data Reviewed External Data Reviewed: notes. Labs: ordered. Decision-making details documented in ED Course. ECG/medicine tests: ordered and independent interpretation performed. Decision-making details documented in ED Course.  Risk Prescription drug management. Decision regarding hospitalization. Diagnosis or treatment significantly limited by social determinants of health.  Patient accompanied by his wife afterwards.  She and I had a lengthy conversation about the patient's  history, stroke, multiple prior hospitalizations, rehab stays, she notes that at some point he was on amlodipine, though he is not currently taking this per chart review. 6:54 PM Remaining labs unremarkable, no evidence for endorgan damage from hypertensive crisis, patient has mild hypertension here, otherwise is in his usual state of health, and as above, some suspicion for the patient's no longer taking amlodipine in addition to his beta-blocker contributing to his hypertension. With otherwise reassuring/consistent physical exam, absence of distress, absence of decompensated state, patient will restart amlodipine will follow-up with his physicians in the coming days.        Final Clinical Impression(s) / ED Diagnoses Final diagnoses:  Hypertensive urgency    Rx / DC Orders ED Discharge Orders          Ordered    amLODipine (NORVASC) 5 MG tablet  Daily        06/23/23 1854              Dorenda Gandy, MD 06/23/23 [id number]

## 2023-06-23 NOTE — Telephone Encounter (Unsigned)
 Copied from CRM [id number]. Topic: Clinical - Home Health Verbal Orders >> Jun 23, 2023  9:06 AM Donald Frost wrote: Caller/Agency: Tabitha with [facility] Callback Number: [phone] Service Requested: Skilled Nursing Any new concerns about the patient? No  The patient overall is doing pretty well but she needs approval from the primary care provider to get orders from the patients Urologist to change his catheter. Please assist further

## 2023-06-23 NOTE — Telephone Encounter (Signed)
 In Dylan Martin's folder to sign

## 2023-06-23 NOTE — Telephone Encounter (Signed)
 [facility] forms  Noted Copied Sleeved  Forms placed in provider box.  Copy in folder at front desk

## 2023-06-23 NOTE — Discharge Instructions (Signed)
 Please take all medication as prescribed and follow-up with your physician.  Return here for concerning changes in your condition.

## 2023-06-23 NOTE — Telephone Encounter (Signed)
  No triage. Pt was just triaged and wife elizabeth called back to ask if [facility] could prescribe medication for blood pressure. RN encouraged pt to listen to previous triage advice and to call back and make f/u appt after ED visit. Nellie Banas verbalized understanding.         Copied from CRM [id number]. Topic: Clinical - Red Word Triage >> Jun 23, 2023  1:05 PM Leory Rands wrote: Red Word that prompted transfer to Nurse Triage: Patient is calling to report elevated BP. And the patient is not taking BP medication. Reporting BP- today within the last hour 180/100, 160/100, 140/112 Reason for Disposition  Health Information question, no triage required and triager able to answer question  Answer Assessment - Initial Assessment Questions 1. REASON FOR CALL or QUESTION: "What is your reason for calling today?" or "How can I best help you?" or "What question do you have that I can help answer?"     No triage. Pt was already triage today.  Protocols used: Information Only Call - No Triage-A-AH

## 2023-06-23 NOTE — Telephone Encounter (Signed)
 Copied from CRM [id number]. Topic: Clinical - Red Word Triage >> Jun 23, 2023 12:30 PM Alpha Arts wrote: Red Word that prompted transfer to Nurse Triage: High Blood Pressure. Reading: 180/100.  Dylan Martin OT at [facility]  Chief Complaint: high BP Symptoms:  Frequency: continual Pertinent Negatives: Patient wife denies weakness more than usual, abnormal symptoms with pt Disposition: [] 911 / [x] ED /[] Urgent Care (no appt availability in office) / [] Appointment(In office/virtual)/ []  [place]/ [] Home Care/ [] Refused Recommended Disposition /[] [facility]/ [x]  Follow-up with PCP Additional Notes: Pt OT Dylan Martin at pt's home for home health OT, reporting that pt has high BP today with reading of 180/100, pt wife confirms no missed doses of BP med, wife confirms pt no weaker than normal, "not doing anything abnormal for himself" today. Pt OT reporting that pt "doesn't communicate well," unable to confirm or deny presence of headache, blurred vision, chest pain, numbness, or dizziness, when asked pt shook his head no for chest pain. Pt wife reporting that pt was examined by PCP on Friday. Advised pt be examined in ED with high BP and uncertainty of symptoms. Pt OT took BP again, 160/100. Advised ED to ensure pt doing okay, advised call back if worsening, sending HP message to PCP to alert them, advised follow up with PCP after for med management. Pt wife and OT verbalized understanding, wife verbalized intent to take pt to ED.   Reason for Disposition  [1] Systolic BP  >= 200 OR Diastolic >= 120 AND [2] having NO cardiac or neurologic symptoms  Answer Assessment - Initial Assessment Questions 1. BLOOD PRESSURE: "What is the blood pressure?" "Did you take at least two measurements 5 minutes apart?"     180/100 just took it twice 2. ONSET: "When did you take your blood pressure?"     Just took it 3. HOW: "How did you take your blood pressure?" (e.g.,  automatic home BP monitor, visiting nurse)     Home health OT 4. HISTORY: "Do you have a history of high blood pressure?"     metoprolol  5. MEDICINES: "Are you taking any medicines for blood pressure?" "Have you missed any doses recently?"     No missed doses 6. OTHER SYMPTOMS: "Do you have any symptoms?" (e.g., blurred vision, chest pain, difficulty breathing, headache, weakness)     don't think we can get an accurate answer on that, doesn't communicate well, breathing seems fine, no weaker than normal per wife  Protocols used: Blood Pressure - High-A-AH

## 2023-06-23 NOTE — Telephone Encounter (Signed)
 That's fine

## 2023-06-24 ENCOUNTER — Ambulatory Visit: Payer: Self-pay | Admitting: Family Medicine

## 2023-06-24 DIAGNOSIS — Z466 Encounter for fitting and adjustment of urinary device: Secondary | ICD-10-CM | POA: Diagnosis not present

## 2023-06-24 DIAGNOSIS — I110 Hypertensive heart disease with heart failure: Secondary | ICD-10-CM | POA: Diagnosis not present

## 2023-06-24 DIAGNOSIS — I509 Heart failure, unspecified: Secondary | ICD-10-CM | POA: Diagnosis not present

## 2023-06-24 DIAGNOSIS — A419 Sepsis, unspecified organism: Secondary | ICD-10-CM | POA: Diagnosis not present

## 2023-06-24 DIAGNOSIS — L89212 Pressure ulcer of right hip, stage 2: Secondary | ICD-10-CM | POA: Diagnosis not present

## 2023-06-24 DIAGNOSIS — N179 Acute kidney failure, unspecified: Secondary | ICD-10-CM | POA: Diagnosis not present

## 2023-06-24 DIAGNOSIS — E119 Type 2 diabetes mellitus without complications: Secondary | ICD-10-CM | POA: Diagnosis not present

## 2023-06-24 DIAGNOSIS — Z435 Encounter for attention to cystostomy: Secondary | ICD-10-CM | POA: Diagnosis not present

## 2023-06-24 DIAGNOSIS — I480 Paroxysmal atrial fibrillation: Secondary | ICD-10-CM | POA: Diagnosis not present

## 2023-06-24 NOTE — Telephone Encounter (Signed)
 Faxed to [facility] at [phone] and to [facility] at [phone]

## 2023-06-24 NOTE — Telephone Encounter (Signed)
 Spoke to patient wife she will pick up the form on 06.05.2025

## 2023-06-24 NOTE — Telephone Encounter (Signed)
 Faxed to [employer] at [phone]

## 2023-06-24 NOTE — Progress Notes (Signed)
 Please inform patient,  Urine culture negative.   Your eGFR levels show that your kidney function is lower. Here are some important steps to take:  Control Your Blood Pressure: Aim to keep your blood pressure below 130/80. Continue taking your blood pressure medications daily.   Keep Cholesterol in Check: This helps protect your blood vessels from further damage.  Pain Management: Avoid NSAIDs (like ibuprofen) and use Tylenol  instead for pain relief.  Kidney-Friendly Diet:  Eat plenty of vegetables like cauliflower, onions, eggplant, and turnips. Choose low-sodium options and limit protein to lean meats (like poultry and fish), eggs, and unsalted seafood. Avoid fatty foods and limit smoking and alcohol.

## 2023-06-25 ENCOUNTER — Telehealth: Payer: Self-pay

## 2023-06-25 NOTE — Telephone Encounter (Signed)
 Copied from CRM [id number]. Topic: Clinical - Home Health Verbal Orders >> Jun 24, 2023  3:54 PM El Gravely T wrote: Caller/[facility], [facility] Callback Number: [phone] Service Requested: Occupational Therapy Frequency: Once a week for 5 weeks Any new concerns about the patient? No

## 2023-06-25 NOTE — Telephone Encounter (Signed)
 Completed.

## 2023-06-26 ENCOUNTER — Ambulatory Visit (INDEPENDENT_AMBULATORY_CARE_PROVIDER_SITE_OTHER): Payer: Self-pay | Admitting: Family Medicine

## 2023-06-26 ENCOUNTER — Encounter: Payer: Self-pay | Admitting: Family Medicine

## 2023-06-26 VITALS — BP 170/100 | HR 60 | Resp 17 | Ht 71.0 in

## 2023-06-26 DIAGNOSIS — A419 Sepsis, unspecified organism: Secondary | ICD-10-CM | POA: Diagnosis not present

## 2023-06-26 DIAGNOSIS — N179 Acute kidney failure, unspecified: Secondary | ICD-10-CM | POA: Diagnosis not present

## 2023-06-26 DIAGNOSIS — L89311 Pressure ulcer of right buttock, stage 1: Secondary | ICD-10-CM | POA: Diagnosis not present

## 2023-06-26 DIAGNOSIS — L89301 Pressure ulcer of unspecified buttock, stage 1: Secondary | ICD-10-CM

## 2023-06-26 DIAGNOSIS — I509 Heart failure, unspecified: Secondary | ICD-10-CM | POA: Diagnosis not present

## 2023-06-26 DIAGNOSIS — L89212 Pressure ulcer of right hip, stage 2: Secondary | ICD-10-CM | POA: Diagnosis not present

## 2023-06-26 DIAGNOSIS — I480 Paroxysmal atrial fibrillation: Secondary | ICD-10-CM | POA: Diagnosis not present

## 2023-06-26 DIAGNOSIS — N3946 Mixed incontinence: Secondary | ICD-10-CM

## 2023-06-26 DIAGNOSIS — Z435 Encounter for attention to cystostomy: Secondary | ICD-10-CM | POA: Diagnosis not present

## 2023-06-26 DIAGNOSIS — I10 Essential (primary) hypertension: Secondary | ICD-10-CM | POA: Diagnosis not present

## 2023-06-26 DIAGNOSIS — I69391 Dysphagia following cerebral infarction: Secondary | ICD-10-CM

## 2023-06-26 DIAGNOSIS — I110 Hypertensive heart disease with heart failure: Secondary | ICD-10-CM | POA: Diagnosis not present

## 2023-06-26 DIAGNOSIS — Z466 Encounter for fitting and adjustment of urinary device: Secondary | ICD-10-CM | POA: Diagnosis not present

## 2023-06-26 DIAGNOSIS — E119 Type 2 diabetes mellitus without complications: Secondary | ICD-10-CM | POA: Diagnosis not present

## 2023-06-26 MED ORDER — METOPROLOL SUCCINATE ER 25 MG PO TB24: 25.0000 mg | ORAL_TABLET | Freq: Every day | ORAL | 3 refills | Status: DC

## 2023-06-26 MED ORDER — SILVER SULFADIAZINE 1 % EX CREA: 1.0000 | TOPICAL_CREAM | Freq: Every day | CUTANEOUS | 0 refills | Status: DC

## 2023-06-26 MED ORDER — AMLODIPINE BESYLATE 10 MG PO TABS: 10.0000 mg | ORAL_TABLET | Freq: Every day | ORAL | 1 refills | Status: DC

## 2023-06-26 NOTE — Progress Notes (Addendum)
 Established Patient Office Visit  Subjective:  Patient ID: Dylan Martin, male    DOB: 1945-05-16  Age: 78 y.o. MRN: [id number]  CC:  Chief Complaint  Patient presents with   Medication Refill    Needs amlodipine  and metoprolol  sent to [facility] for a 90 day supply. Also a rx for thick it. Also wants clobetasol 0.05% called in again for bedsores. Needs to know if he needs lisinopril again like he did at [facility]   Referral    Need to give verbal order to [facility] for catheter changes and speech therapy eval [phone]   Allergic Rhinitis     HPI Dylan Martin is a 78 y.o. male with past medical history of type 2 diabetes, CKD and esophageal reflux, hemiplegia presents for f/u of  chronic medical conditions with the above complaints.  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HCC)    Aphasia following cerebral infarction    Atherosclerotic heart disease of native coronary artery without angina pectoris    Benign prostatic hyperplasia with lower urinary tract symptoms    Benign prostatic hyperplasia without lower urinary tract symptoms    Bradycardia, unspecified    Cerebral infarction (HCC)    Chronic kidney disease, stage 3 unspecified (HCC)    Cognitive communication deficit    Dysarthria    Dysphagia    Encephalopathy    Essential (primary) hypertension    Gastro-esophageal reflux disease without esophagitis    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HCC)    Hemiplegia (HCC)    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)    Hyperlipidemia    Hyperosmolality and hypernatremia    Muscle weakness (generalized)    Myocardial infarct, old    Neuromuscular dysfunction of bladder, unspecified    Nontraumatic intracerebral hemorrhage (HCC)    Nontraumatic intracerebral hemorrhage (HCC)    Obstructive sleep apnea    Paroxysmal atrial fibrillation (HCC)    Presence of cardiac  pacemaker    Presence of coronary angioplasty implant and graft    Pressure ulcer    Renovascular hypertension    Retention of urine    Seizures (HCC)    TIA (transient ischemic attack)    Type 2 diabetes mellitus without complication (HCC)    Unspecified atrial fibrillation (HCC)    Unspecified disorder of synovium and tendon, right shoulder    Unspecified lack of coordination    Weakness     Past Surgical History:  Procedure Laterality Date   FEMUR SURGERY Left    HIP SURGERY Left    IR GASTROSTOMY TUBE REMOVAL  02/26/2022   SPINAL CORD STIMULATOR IMPLANT     per wife nerve stimulator in back    No family history on file.  Social History   Socioeconomic History   Marital status: Married    Spouse name: Not on file   Number of children: Not on file   Years of education: Not on file   Highest education level: Not on file  Occupational History   Not on file  Tobacco Use   Smoking status: Never   Smokeless tobacco: Never  Vaping Use   Vaping status: Never Used  Substance and Sexual Activity   Alcohol use: Not Currently   Drug use: Never   Sexual activity: Not on file  Other Topics Concern   Not on file  Social History Narrative   Not on file   Social Drivers of Health  Financial Resource Strain: Low Risk  (06/09/2023)   Received from [facility]   Overall Financial Resource Strain (CARDIA)    Difficulty of Paying Living Expenses: Not hard at all  Food Insecurity: No Food Insecurity (06/09/2023)   Received from [facility]   Hunger Vital Sign    Within the past 12 months, you worried that your food would run out before you got the money to buy more.: Never true    Within the past 12 months, the food you bought just didn't last and you didn't have money to get more.: Never true  Transportation Needs: No Transportation Needs (06/09/2023)   Received from [facility]   PRAPARE - Transportation    Lack of Transportation (Medical): No    Lack of  Transportation (Non-Medical): No  Physical Activity: Patient Unable To Answer (06/09/2023)   Received from [facility]   Exercise Vital Sign    On average, how many days per week do you engage in moderate to strenuous exercise (like a brisk walk)?: Patient unable to answer    On average, how many minutes do you engage in exercise at this level?: Patient unable to answer  Stress: Patient Unable To Answer (06/09/2023)   Received from [facility] - Occupational Stress Questionnaire    Feeling of Stress : Patient unable to answer  Social Connections: Patient Unable To Answer (06/09/2023)   Received from [facility]   Social Connection and Isolation Panel    In a typical week, how many times do you talk on the phone with family, friends, or neighbors?: Patient unable to answer    How often do you get together with friends or relatives?: Patient unable to answer    How often do you attend church or religious services?: Patient unable to answer    Do you belong to any clubs or organizations such as church groups, unions, fraternal or athletic groups, or school groups?: Patient unable to answer    How often do you attend meetings of the clubs or organizations you belong to?: Patient unable to answer    Are you married, widowed, divorced, separated, never married, or living with a partner?: Patient unable to answer  Intimate Partner Violence: Not At Risk (06/09/2023)   Received from [facility]   Humiliation, Afraid, Rape, and Kick questionnaire    Within the last year, have you been afraid of your partner or ex-partner?: No    Within the last year, have you been humiliated or emotionally abused in other ways by your partner or ex-partner?: No    Within the last year, have you been kicked, hit, slapped, or otherwise physically hurt by your partner or ex-partner?: No    Within the last year, have you been raped or forced to have any kind of sexual  activity by your partner or ex-partner?: No    Outpatient Medications Prior to Visit  Medication Sig Dispense Refill   acetaminophen  (TYLENOL ) 325 MG tablet Take 650 mg by mouth every 4 (four) hours as needed for mild pain or fever.     AMBULATORY NON FORMULARY MEDICATION 1 Device by Does not apply route at bedtime. BiPap     ascorbic acid (VITAMIN C) 500 MG tablet Take 500 mg by mouth daily.     Baclofen  5 MG TABS Take 1 tablet (5 mg total) by mouth 3 (three) times daily. 90 tablet 1   bisacodyl  (DULCOLAX)  10 MG suppository Place 10 mg rectally daily as needed (constipation).     bisacodyl  (DULCOLAX) 5 MG EC tablet Take 1 tablet (5 mg total) by mouth daily as needed for moderate constipation.     famotidine  (PEPCID ) 20 MG tablet Take 1 tablet (20 mg total) by mouth daily. 30 tablet 1   feeding supplement (ENSURE ENLIVE / ENSURE PLUS) LIQD Take 237 mLs by mouth 2 (two) times daily between meals.     fexofenadine (ALLEGRA) 180 MG tablet Take 180 mg by mouth daily as needed for allergies or rhinitis.     gabapentin  (NEURONTIN ) 100 MG capsule Take 1 capsule (100 mg total) by mouth 2 (two) times daily. 60 capsule 1   magic mouthwash w/lidocaine  SOLN Take 5 mLs by mouth 3 (three) times daily. Suspension contains equal amounts of Maalox Extra Strength, nystatin , diphenhydramine and lidocaine  x 3 days     melatonin 5 MG TABS Take 5 mg by mouth at bedtime.     Menthol , Topical Analgesic, 4 % GEL Apply 1 application  topically 2 (two) times daily. To both legs     Multiple Vitamin (MULTIVITAMIN WITH MINERALS) TABS tablet Take 1 tablet by mouth daily.     nitroGLYCERIN  (NITROSTAT ) 0.4 MG SL tablet Place 0.4 mg under the tongue every 5 (five) minutes as needed for chest pain.     NON FORMULARY Take 1 Container by mouth 2 (two) times daily. Magic Cups     ondansetron  (ZOFRAN ) 4 MG tablet Take 1 tablet (4 mg total) by mouth every 12 (twelve) hours as needed for vomiting or nausea. 30 tablet 1   phosphorus  (K PHOS  NEUTRAL) [id number] MG tablet Take 2 tablets (500 mg total) by mouth 2 (two) times daily. X 4 doses     thiamine 100 MG tablet Take 100 mg by mouth daily.     allopurinol  (ZYLOPRIM ) 100 MG tablet Take 1 tablet (100 mg total) by mouth daily. 30 tablet 1   amLODipine  (NORVASC ) 5 MG tablet Take 1 tablet (5 mg total) by mouth daily. 30 tablet 0   famotidine  (PEPCID ) 20 MG tablet Take 1 tablet (20 mg total) by mouth daily. 30 tablet 1   FLUoxetine  (PROZAC ) 20 MG capsule Take 1 capsule (20 mg total) by mouth daily. 30 capsule 1   metoprolol  succinate (TOPROL -XL) 25 MG 24 hr tablet Take 0.5 tablets (12.5 mg total) by mouth 2 (two) times daily. 60 tablet 1   ondansetron  (ZOFRAN ) 4 MG tablet Take 1 tablet (4 mg total) by mouth every 8 (eight) hours as needed for nausea or vomiting. 20 tablet 0   simvastatin  (ZOCOR ) 40 MG tablet Take 1 tablet (40 mg total) by mouth daily at 6 PM. 30 tablet 1   traZODone  (DESYREL ) 50 MG tablet Take 1 tablet (50 mg total) by mouth at bedtime as needed. 30 tablet 1   cefdinir  (OMNICEF ) 300 MG capsule Take 1 capsule (300 mg total) by mouth every 12 (twelve) hours. (Patient not taking: Reported on 06/26/2023)     No facility-administered medications prior to visit.    Allergies  Allergen Reactions   Asa [Aspirin] Other (See Comments)    Hx of aneurysm, told to avoid by MD   Other Other (See Comments)    Blood thinners- wife wants documented that patient should not be on blood thinners due to past medical history (Hx of aneurysm) Red meat - causes gout    Shrimp (Diagnostic) Other (See Comments)    Gout flare  ROS Review of Systems  Constitutional:  Negative for fatigue and fever.  Eyes:  Negative for visual disturbance.  Respiratory:  Negative for chest tightness and shortness of breath.   Cardiovascular:  Negative for chest pain and palpitations.  Genitourinary:        Urinary incontinence  Neurological:  Negative for dizziness and headaches.       Objective:     Physical Exam HENT:     Head: Normocephalic.     Right Ear: External ear normal.     Left Ear: External ear normal.     Nose: No congestion or rhinorrhea.     Mouth/Throat:     Mouth: Mucous membranes are moist.  Cardiovascular:     Rate and Rhythm: Regular rhythm.     Heart sounds: No murmur heard. Pulmonary:     Effort: No respiratory distress.     Breath sounds: Normal breath sounds.  Neurological:     Mental Status: He is alert.     BP (!) 170/100   Pulse 60   Resp 17   Ht 5' 11 (1.803 m)   SpO2 93%   BMI 21.76 kg/m  Wt Readings from Last 3 Encounters:  07/10/23 155 lb 13.8 oz (70.7 kg)  06/23/23 156 lb (70.8 kg)  12/06/22 157 lb 6.5 oz (71.4 kg)    Lab Results  Component Value Date   TSH 1.210 07/16/2021   Lab Results  Component Value Date   WBC 6.2 07/10/2023   HGB 13.0 07/10/2023   HCT 39.6 07/10/2023   MCV 91.5 07/10/2023   PLT 222 07/10/2023   Lab Results  Component Value Date   NA 133 (L) 07/10/2023   K 3.7 07/10/2023   CO2 23 07/10/2023   GLUCOSE 99 07/10/2023   BUN 21 07/10/2023   CREATININE 1.40 (H) 07/10/2023   BILITOT 0.3 06/23/2023   ALKPHOS 98 06/23/2023   AST 11 (L) 06/23/2023   ALT 10 06/23/2023   PROT 6.9 06/23/2023   ALBUMIN 2.8 (L) 06/23/2023   CALCIUM 9.3 07/10/2023   ANIONGAP 9 07/10/2023   EGFR 50 (L) 06/20/2023   Lab Results  Component Value Date   CHOL 90 (L) 07/16/2021   Lab Results  Component Value Date   HDL 32 (L) 07/16/2021   Lab Results  Component Value Date   LDLCALC 39 07/16/2021   Lab Results  Component Value Date   TRIG 102 07/16/2021   Lab Results  Component Value Date   CHOLHDL 2.8 07/16/2021   Lab Results  Component Value Date   HGBA1C 5.5 12/01/2022      Assessment & Plan:  Essential hypertension Assessment & Plan: The patient presents with uncontrolled blood pressure. Will increase Metoprolol  to 25 mg daily and Amlodipine  to 10 mg daily. The patient is encouraged  to follow up in one month for blood pressure re-evaluation.  A low-sodium diet of less than 2,300 mg daily is recommended, along with moderate-intensity physical activity for at least 150 minutes per week. The patient is encouraged to maintain these lifestyle modifications to help manage her blood pressure effectively.  Long-term considerations were discussed, emphasizing that uncontrolled hypertension increases the risk of cardiovascular diseases, including stroke, coronary artery disease, and heart failure.  The patient is encouraged to seek emergency care if blood pressure exceeds 180/120 and is accompanied by symptoms such as headaches, chest pain, palpitations, blurred vision, or dizziness. She verbalized understanding and will follow up as scheduled.    Orders: -  amLODIPine  Besylate; Take 1 tablet (10 mg total) by mouth daily.  Dispense: 90 tablet; Refill: 1 -     Metoprolol  Succinate ER; Take 1 tablet (25 mg total) by mouth daily.  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   Orders: -     SLP modified barium swallow  Pressure injury of right buttock, stage 1 Assessment & Plan: Encouraged to start applying Silver  Sulfadiazine  1% cream to the affected site once daily, or as directed, to promote healing and prevent infection.  Encouraged to cleanse the area gently before application and cover with a sterile dressing.  Encouraged to monitor for signs of improvement or worsening, such as increased redness, drainage, or pain.     Mixed stress and urge urinary incontinence Assessment & Plan: The patient reports that symptoms began about several months ago. He is experiencing both urge and stress urinary incontinence and is requesting urinary supplies from [employer]. He was encouraged to  begin nonpharmacological interventions, including: Pelvic floor (Kegel) exercises to strengthen bladder control Bladder training by gradually increasing the time between voids Limiting caffeine and alcohol, which may irritate the bladder Maintaining a healthy weight to reduce pressure on the bladder Scheduled toileting to prevent accidents The patient was advised to follow up if symptoms worsen or do not improve with conservative measures.   The patient requires a reclining wheelchair due to the presence of a suprapubic catheter. The patient is currently at home and will be following up with his urologist for catheter replacement.  Provision of the reclining wheelchair will help ensure comfort and proper positioning while managing the catheter care.     Note: This chart has been completed using Engineer, civil (consulting) software, and while attempts have been made to ensure accuracy, certain words and phrases may not be transcribed as intended.    Follow-up: No follow-ups on file.   Stasha Naraine, FNP

## 2023-06-26 NOTE — Patient Instructions (Addendum)
 I appreciate the opportunity to provide care to you today!   Hypertension Management  Your current blood pressure is above the target goal of <140/90 mmHg. To address this, please start taking amlodipine 10 mg daily and metoprolol  25 mg BID  Medication Instructions: Take your blood pressure medication at the same time each day. After taking your medication, check your blood pressure at least an hour later. If your first reading is >140/90 mmHg, wait at least 10 minutes and recheck your blood pressure. Side Effects: In the initial days of therapy, you may experience dizziness or lightheadedness as your body adjusts to the lower blood pressure; this is expected. Diet and Lifestyle: Adhere to a low-sodium diet, limiting intake to less than 1500 mg daily, and increase your physical activity. Avoid over-the-counter NSAIDs such as ibuprofen and naproxen while on this medication. Hydration and Nutrition: Stay well-hydrated by drinking at least 64 ounces of water daily. Increase your servings of fruits and vegetables and avoid excessive sodium in your diet. Long-Term Considerations: Uncontrolled hypertension can increase the risk of cardiovascular diseases, including stroke, coronary artery disease, and heart failure.  Please report to the emergency department if your blood pressure exceeds 180/120 and is accompanied by symptoms such as headaches, chest pain, palpitations, blurred vision, or dizziness.   Bedsore: Start applying Silver Sulfadiazine 1% cream to the affected site once daily, or as directed, to promote healing and prevent infection. Cleanse the area gently before application and cover with a sterile dressing. Monitor for signs of improvement or worsening, such as increased redness, drainage, or pain.   Please follow up if your symptoms worsen or fail to improve.   Referrals today- swallow test    Please continue to a heart-healthy diet and increase your physical activities. Try to  exercise for at least five days a week.    It was a pleasure to see you and I look forward to continuing to work together on your health and well-being. Please do not hesitate to call the office if you need care or have questions about your care.  In case of emergency, please visit the Emergency Department for urgent care, or contact our clinic at [phone] to schedule an appointment. We're here to help you!   Have a wonderful day and week. With Gratitude, Alinna Siple MSN, FNP-BC

## 2023-06-27 DIAGNOSIS — A419 Sepsis, unspecified organism: Secondary | ICD-10-CM | POA: Diagnosis not present

## 2023-06-27 DIAGNOSIS — I509 Heart failure, unspecified: Secondary | ICD-10-CM | POA: Diagnosis not present

## 2023-06-27 DIAGNOSIS — E119 Type 2 diabetes mellitus without complications: Secondary | ICD-10-CM | POA: Diagnosis not present

## 2023-06-27 DIAGNOSIS — Z466 Encounter for fitting and adjustment of urinary device: Secondary | ICD-10-CM | POA: Diagnosis not present

## 2023-06-27 DIAGNOSIS — I480 Paroxysmal atrial fibrillation: Secondary | ICD-10-CM | POA: Diagnosis not present

## 2023-06-27 DIAGNOSIS — N179 Acute kidney failure, unspecified: Secondary | ICD-10-CM | POA: Diagnosis not present

## 2023-06-27 DIAGNOSIS — L89212 Pressure ulcer of right hip, stage 2: Secondary | ICD-10-CM | POA: Diagnosis not present

## 2023-06-27 DIAGNOSIS — Z435 Encounter for attention to cystostomy: Secondary | ICD-10-CM | POA: Diagnosis not present

## 2023-06-27 DIAGNOSIS — I110 Hypertensive heart disease with heart failure: Secondary | ICD-10-CM | POA: Diagnosis not present

## 2023-06-29 DIAGNOSIS — L89301 Pressure ulcer of unspecified buttock, stage 1: Secondary | ICD-10-CM | POA: Insufficient documentation

## 2023-06-29 NOTE — Assessment & Plan Note (Signed)
 The patient presents with uncontrolled blood pressure. Will increase Metoprolol  to 25 mg daily and Amlodipine  to 10 mg daily. The patient is encouraged to follow up in one month for blood pressure re-evaluation.  A low-sodium diet of less than 2,300 mg daily is recommended, along with moderate-intensity physical activity for at least 150 minutes per week. The patient is encouraged to maintain these lifestyle modifications to help manage her blood pressure effectively.  Long-term considerations were discussed, emphasizing that uncontrolled hypertension increases the risk of cardiovascular diseases, including stroke, coronary artery disease, and heart failure.  The patient is encouraged to seek emergency care if blood pressure exceeds 180/120 and is accompanied by symptoms such as headaches, chest pain, palpitations, blurred vision, or dizziness. She verbalized understanding and will follow up as scheduled.

## 2023-06-29 NOTE — Assessment & Plan Note (Signed)
 A referral will be placed for a swallow study to reassess the patient's dysphagia and determine the appropriate diet and thickened liquid consistency needed.

## 2023-06-29 NOTE — Assessment & Plan Note (Addendum)
 Encouraged to start applying Silver  Sulfadiazine  1% cream to the affected site once daily, or as directed, to promote healing and prevent infection.  Encouraged to cleanse the area gently before application and cover with a sterile dressing.  Encouraged to monitor for signs of improvement or worsening, such as increased redness, drainage, or pain.

## 2023-06-30 DIAGNOSIS — I509 Heart failure, unspecified: Secondary | ICD-10-CM | POA: Diagnosis not present

## 2023-06-30 DIAGNOSIS — Z435 Encounter for attention to cystostomy: Secondary | ICD-10-CM | POA: Diagnosis not present

## 2023-06-30 DIAGNOSIS — I110 Hypertensive heart disease with heart failure: Secondary | ICD-10-CM | POA: Diagnosis not present

## 2023-06-30 DIAGNOSIS — E119 Type 2 diabetes mellitus without complications: Secondary | ICD-10-CM | POA: Diagnosis not present

## 2023-06-30 DIAGNOSIS — N179 Acute kidney failure, unspecified: Secondary | ICD-10-CM | POA: Diagnosis not present

## 2023-06-30 DIAGNOSIS — A419 Sepsis, unspecified organism: Secondary | ICD-10-CM | POA: Diagnosis not present

## 2023-06-30 DIAGNOSIS — I480 Paroxysmal atrial fibrillation: Secondary | ICD-10-CM | POA: Diagnosis not present

## 2023-06-30 DIAGNOSIS — Z466 Encounter for fitting and adjustment of urinary device: Secondary | ICD-10-CM | POA: Diagnosis not present

## 2023-06-30 DIAGNOSIS — L89212 Pressure ulcer of right hip, stage 2: Secondary | ICD-10-CM | POA: Diagnosis not present

## 2023-06-30 NOTE — Telephone Encounter (Signed)
 Attempted to call [facility] to provided verbal orders left voicemail to call back

## 2023-07-01 DIAGNOSIS — Z435 Encounter for attention to cystostomy: Secondary | ICD-10-CM | POA: Diagnosis not present

## 2023-07-01 DIAGNOSIS — N179 Acute kidney failure, unspecified: Secondary | ICD-10-CM | POA: Diagnosis not present

## 2023-07-01 DIAGNOSIS — E119 Type 2 diabetes mellitus without complications: Secondary | ICD-10-CM | POA: Diagnosis not present

## 2023-07-01 DIAGNOSIS — L89212 Pressure ulcer of right hip, stage 2: Secondary | ICD-10-CM | POA: Diagnosis not present

## 2023-07-01 DIAGNOSIS — I110 Hypertensive heart disease with heart failure: Secondary | ICD-10-CM | POA: Diagnosis not present

## 2023-07-01 DIAGNOSIS — I480 Paroxysmal atrial fibrillation: Secondary | ICD-10-CM | POA: Diagnosis not present

## 2023-07-01 DIAGNOSIS — Z466 Encounter for fitting and adjustment of urinary device: Secondary | ICD-10-CM | POA: Diagnosis not present

## 2023-07-01 DIAGNOSIS — I509 Heart failure, unspecified: Secondary | ICD-10-CM | POA: Diagnosis not present

## 2023-07-01 DIAGNOSIS — A419 Sepsis, unspecified organism: Secondary | ICD-10-CM | POA: Diagnosis not present

## 2023-07-02 ENCOUNTER — Telehealth: Payer: Self-pay | Admitting: Family Medicine

## 2023-07-02 DIAGNOSIS — L89212 Pressure ulcer of right hip, stage 2: Secondary | ICD-10-CM | POA: Diagnosis not present

## 2023-07-02 DIAGNOSIS — I480 Paroxysmal atrial fibrillation: Secondary | ICD-10-CM | POA: Diagnosis not present

## 2023-07-02 DIAGNOSIS — Z435 Encounter for attention to cystostomy: Secondary | ICD-10-CM | POA: Diagnosis not present

## 2023-07-02 DIAGNOSIS — A419 Sepsis, unspecified organism: Secondary | ICD-10-CM | POA: Diagnosis not present

## 2023-07-02 DIAGNOSIS — N179 Acute kidney failure, unspecified: Secondary | ICD-10-CM | POA: Diagnosis not present

## 2023-07-02 DIAGNOSIS — Z466 Encounter for fitting and adjustment of urinary device: Secondary | ICD-10-CM | POA: Diagnosis not present

## 2023-07-02 DIAGNOSIS — I509 Heart failure, unspecified: Secondary | ICD-10-CM | POA: Diagnosis not present

## 2023-07-02 DIAGNOSIS — E119 Type 2 diabetes mellitus without complications: Secondary | ICD-10-CM | POA: Diagnosis not present

## 2023-07-02 DIAGNOSIS — I110 Hypertensive heart disease with heart failure: Secondary | ICD-10-CM | POA: Diagnosis not present

## 2023-07-02 NOTE — Telephone Encounter (Signed)
 Copied from CRM [id number]. Topic: Clinical - Prescription Issue >> Jul 02, 2023 11:24 AM Kevelyn M wrote: Reason for CRM: ondansetron  (ZOFRAN ) 4 MG tablet - need to verify which direction was accurate. Two prescriptions were sent with different directives.  Ben with [facility] - Phone:[phone]:[phone]

## 2023-07-03 DIAGNOSIS — L89212 Pressure ulcer of right hip, stage 2: Secondary | ICD-10-CM | POA: Diagnosis not present

## 2023-07-03 DIAGNOSIS — E119 Type 2 diabetes mellitus without complications: Secondary | ICD-10-CM | POA: Diagnosis not present

## 2023-07-03 DIAGNOSIS — Z466 Encounter for fitting and adjustment of urinary device: Secondary | ICD-10-CM | POA: Diagnosis not present

## 2023-07-03 DIAGNOSIS — A419 Sepsis, unspecified organism: Secondary | ICD-10-CM | POA: Diagnosis not present

## 2023-07-03 DIAGNOSIS — N179 Acute kidney failure, unspecified: Secondary | ICD-10-CM | POA: Diagnosis not present

## 2023-07-03 DIAGNOSIS — Z435 Encounter for attention to cystostomy: Secondary | ICD-10-CM | POA: Diagnosis not present

## 2023-07-03 DIAGNOSIS — I110 Hypertensive heart disease with heart failure: Secondary | ICD-10-CM | POA: Diagnosis not present

## 2023-07-03 DIAGNOSIS — I480 Paroxysmal atrial fibrillation: Secondary | ICD-10-CM | POA: Diagnosis not present

## 2023-07-03 DIAGNOSIS — I509 Heart failure, unspecified: Secondary | ICD-10-CM | POA: Diagnosis not present

## 2023-07-03 NOTE — Telephone Encounter (Signed)
 Take every 8 hours as needed for nausea

## 2023-07-04 ENCOUNTER — Telehealth: Payer: Self-pay

## 2023-07-04 NOTE — Telephone Encounter (Signed)
 Pts wife Tonya Carlile, called to let us  know she wants her and his rxs filled for 90-days for both her and his, and to continue sending them [facility].

## 2023-07-04 NOTE — Telephone Encounter (Signed)
 Copied from CRM [id number]. Topic: Clinical - Prescription Issue >> Jul 04, 2023  1:18 PM Lizabeth Riggs wrote: Reason for CRM:  The following medication orders were suppose to be 90 day supply. He received a 60 supply. Please resend an order for a 90 day supply traZODone  (DESYREL ) 50 MG tablet famotidine  (PEPCID ) 20 MG tablet  allopurinol  (ZYLOPRIM ) 100 MG tablet gabapentin  (NEURONTIN ) 100 MG capsule FLUoxetine  (PROZAC ) 20 MG capsule  simvastatin  (ZOCOR ) 40 MG tablet Please resend to [facility]

## 2023-07-04 NOTE — Telephone Encounter (Signed)
 Documented, Already spoke to wife concerning 90-days supply

## 2023-07-04 NOTE — Telephone Encounter (Signed)
 Needs a new prescription for it, states [facility] pharmacist needs the new rx

## 2023-07-07 ENCOUNTER — Other Ambulatory Visit: Payer: Self-pay | Admitting: *Deleted

## 2023-07-07 DIAGNOSIS — N2889 Other specified disorders of kidney and ureter: Secondary | ICD-10-CM

## 2023-07-08 ENCOUNTER — Telehealth: Payer: Self-pay

## 2023-07-08 DIAGNOSIS — N179 Acute kidney failure, unspecified: Secondary | ICD-10-CM | POA: Diagnosis not present

## 2023-07-08 DIAGNOSIS — I110 Hypertensive heart disease with heart failure: Secondary | ICD-10-CM | POA: Diagnosis not present

## 2023-07-08 DIAGNOSIS — I509 Heart failure, unspecified: Secondary | ICD-10-CM | POA: Diagnosis not present

## 2023-07-08 DIAGNOSIS — I480 Paroxysmal atrial fibrillation: Secondary | ICD-10-CM | POA: Diagnosis not present

## 2023-07-08 DIAGNOSIS — Z466 Encounter for fitting and adjustment of urinary device: Secondary | ICD-10-CM | POA: Diagnosis not present

## 2023-07-08 DIAGNOSIS — E119 Type 2 diabetes mellitus without complications: Secondary | ICD-10-CM | POA: Diagnosis not present

## 2023-07-08 DIAGNOSIS — Z435 Encounter for attention to cystostomy: Secondary | ICD-10-CM | POA: Diagnosis not present

## 2023-07-08 DIAGNOSIS — L89212 Pressure ulcer of right hip, stage 2: Secondary | ICD-10-CM | POA: Diagnosis not present

## 2023-07-08 DIAGNOSIS — A419 Sepsis, unspecified organism: Secondary | ICD-10-CM | POA: Diagnosis not present

## 2023-07-08 NOTE — Telephone Encounter (Signed)
 Copied from CRM [id number]. Topic: General - Other >> Jul 08, 2023 12:26 PM Marissa P wrote: Reason for CRM: Adell Hones with [facility] called to say that she met resistance when trying to change caterer, please advise on what to do Callback# [phone]

## 2023-07-09 ENCOUNTER — Telehealth: Payer: Self-pay

## 2023-07-09 NOTE — Telephone Encounter (Signed)
 Copied from CRM [id number]. Topic: General - Other >> Jul 08, 2023 12:26 PM Marissa P wrote: Reason for CRM: Adell Hones with [facility] called to say that she met resistance when trying to change caterer, please advise on what to do Callback# [phone]

## 2023-07-10 ENCOUNTER — Emergency Department (HOSPITAL_COMMUNITY)
Admission: EM | Admit: 2023-07-10 | Discharge: 2023-07-10 | Disposition: A | Discharge status: Home / Self Care | Attending: Student | Admitting: Student

## 2023-07-10 ENCOUNTER — Other Ambulatory Visit: Payer: Self-pay

## 2023-07-10 ENCOUNTER — Encounter (HOSPITAL_COMMUNITY): Payer: Self-pay

## 2023-07-10 DIAGNOSIS — E119 Type 2 diabetes mellitus without complications: Secondary | ICD-10-CM | POA: Diagnosis not present

## 2023-07-10 DIAGNOSIS — Z79899 Other long term (current) drug therapy: Secondary | ICD-10-CM | POA: Diagnosis not present

## 2023-07-10 DIAGNOSIS — T83098A Other mechanical complication of other indwelling urethral catheter, initial encounter: Secondary | ICD-10-CM | POA: Diagnosis not present

## 2023-07-10 DIAGNOSIS — Z960 Presence of urogenital implants: Secondary | ICD-10-CM | POA: Insufficient documentation

## 2023-07-10 DIAGNOSIS — R109 Unspecified abdominal pain: Secondary | ICD-10-CM | POA: Diagnosis present

## 2023-07-10 DIAGNOSIS — I10 Essential (primary) hypertension: Secondary | ICD-10-CM | POA: Diagnosis not present

## 2023-07-10 DIAGNOSIS — N3001 Acute cystitis with hematuria: Secondary | ICD-10-CM | POA: Insufficient documentation

## 2023-07-10 DIAGNOSIS — Z8673 Personal history of transient ischemic attack (TIA), and cerebral infarction without residual deficits: Secondary | ICD-10-CM | POA: Diagnosis not present

## 2023-07-10 DIAGNOSIS — Z9359 Other cystostomy status: Secondary | ICD-10-CM

## 2023-07-10 DIAGNOSIS — R531 Weakness: Secondary | ICD-10-CM | POA: Diagnosis not present

## 2023-07-10 DIAGNOSIS — Z7401 Bed confinement status: Secondary | ICD-10-CM | POA: Diagnosis not present

## 2023-07-10 LAB — CBC WITH DIFFERENTIAL/PLATELET
Abs Immature Granulocytes: 0.03 10*3/uL (ref 0.00–0.07)
Basophils Absolute: 0 10*3/uL (ref 0.0–0.1)
Basophils Relative: 1 %
Eosinophils Absolute: 0.4 10*3/uL (ref 0.0–0.5)
Eosinophils Relative: 7 %
HCT: 39.6 % (ref 39.0–52.0)
Hemoglobin: 13 g/dL (ref 13.0–17.0)
Immature Granulocytes: 1 %
Lymphocytes Relative: 35 %
Lymphs Abs: 2.2 10*3/uL (ref 0.7–4.0)
MCH: 30 pg (ref 26.0–34.0)
MCHC: 32.8 g/dL (ref 30.0–36.0)
MCV: 91.5 fL (ref 80.0–100.0)
Monocytes Absolute: 0.5 10*3/uL (ref 0.1–1.0)
Monocytes Relative: 8 %
Neutro Abs: 3 10*3/uL (ref 1.7–7.7)
Neutrophils Relative %: 48 %
Platelets: 222 10*3/uL (ref 150–400)
RBC: 4.33 MIL/uL (ref 4.22–5.81)
RDW: 14.3 % (ref 11.5–15.5)
WBC: 6.2 10*3/uL (ref 4.0–10.5)
nRBC: 0 % (ref 0.0–0.2)

## 2023-07-10 LAB — BASIC METABOLIC PANEL WITH GFR
Anion gap: 9 (ref 5–15)
BUN: 21 mg/dL (ref 8–23)
CO2: 23 mmol/L (ref 22–32)
Calcium: 9.3 mg/dL (ref 8.9–10.3)
Chloride: 101 mmol/L (ref 98–111)
Creatinine, Ser: 1.4 mg/dL — ABNORMAL HIGH (ref 0.61–1.24)
GFR, Estimated: 51 mL/min — ABNORMAL LOW (ref >60)
Glucose, Bld: 99 mg/dL (ref 70–99)
Potassium: 3.7 mmol/L (ref 3.5–5.1)
Sodium: 133 mmol/L — ABNORMAL LOW (ref 135–145)

## 2023-07-10 LAB — URINALYSIS, ROUTINE W REFLEX MICROSCOPIC
Bilirubin Urine: NEGATIVE
Glucose, UA: NEGATIVE mg/dL
Ketones, ur: NEGATIVE mg/dL
Nitrite: NEGATIVE
Protein, ur: 100 mg/dL — AB
RBC / HPF: >50 RBC/hpf (ref 0–5)
Specific Gravity, Urine: 1.004 — ABNORMAL LOW (ref 1.005–1.030)
WBC, UA: >50 WBC/hpf (ref 0–5)
pH: 7 (ref 5.0–8.0)

## 2023-07-10 MED ORDER — CEFPODOXIME PROXETIL 100 MG PO TABS: 100.0000 mg | ORAL_TABLET | Freq: Two times a day (BID) | ORAL | 0 refills | Status: AC

## 2023-07-10 MED ORDER — SODIUM CHLORIDE 0.9 % IV SOLN
1.0000 g | Freq: Once | INTRAVENOUS | Status: AC
  Administered 2023-07-10: 1 g via INTRAVENOUS
  Filled 2023-07-10: qty 10

## 2023-07-10 NOTE — Discharge Instructions (Addendum)
 It was a pleasure taking care of you today.  You are seen for changing of your suprapubic catheter and drainage around the catheter.  We were able to change this without difficulty, but you do have a UTI.  You were given antibiotics here and are given a prescription for home.  The urine was sent for a culture.  Follow-up closely with your doctor to make sure you are on the right antibiotic.  Come back for fever, vomiting or any other new or worsening symptoms.

## 2023-07-10 NOTE — Telephone Encounter (Signed)
 Please follow up with urology

## 2023-07-10 NOTE — ED Triage Notes (Signed)
 Pt arrived via [facility] from home for assistance with his Suprapubic catheter. Per EMS, Pts home health nurse attempted to change his catheter 2 days ago and met resistance and could not swap out his catheter. Pt presents contracted at baseline and does present with sanguinous drainage around the catheter site.

## 2023-07-10 NOTE — ED Provider Notes (Signed)
 [place] EMERGENCY DEPARTMENT AT [facility] Provider Note   CSN: [id number] Arrival date & time: 07/10/23  1432     Patient presents with: Abdominal Pain   Lieutenant Abarca is a 78 y.o. male.  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  He has a suprapubic Foley due to history of neurogenic bladder.  Brought to the ER today for suprapubic catheter change by his wife.  She states that he had hospitalization for urosepsis about a month ago, ultimately she brought him back home.  He has been doing well until 2 days ago, he was supposed to have his suprapubic Foley changed by home health.  When they attempted to remove the catheter they felt some resistance, so they blew up the balloon and advise she have it changed by urology.  They were not going to be able to get into urology for about a month, so were advised to come to the ER for changing the catheter.  She notes he has been having some foul drainage around the catheter as well.  He has not had a fever and has been otherwise acting like his usual self.   Abdominal Pain      Prior to Admission medications   Medication Sig Start Date End Date Taking? Authorizing Provider  cefpodoxime (VANTIN) 100 MG tablet Take 1 tablet (100 mg total) by mouth 2 (two) times daily for 6 days. 07/11/23 07/17/23 Yes Kris No A, PA-C  acetaminophen  (TYLENOL ) 325 MG tablet Take 650 mg by mouth every 4 (four) hours as needed for mild pain or fever.    [provider]  allopurinol  (ZYLOPRIM ) 100 MG tablet Take 1 tablet (100 mg total) by mouth daily. 06/20/23   Del Amber Bail, Rogerio Clay, FNP  AMBULATORY NON FORMULARY MEDICATION 1 Device by Does not apply route at bedtime. BiPap    [provider]  amLODipine  (NORVASC ) 10 MG tablet Take 1 tablet (10 mg total) by mouth daily. 06/26/23   Zarwolo, Gloria, FNP  ascorbic acid (VITAMIN C) 500 MG tablet Take 500 mg by mouth daily.     [provider]  Baclofen  5 MG TABS Take 1 tablet (5 mg total) by mouth 3 (three) times daily. 06/20/23   Del Abron Abt, FNP  bisacodyl  (DULCOLAX) 10 MG suppository Place 10 mg rectally daily as needed (constipation). 11/17/20   [provider]  bisacodyl  (DULCOLAX) 5 MG EC tablet Take 1 tablet (5 mg total) by mouth daily as needed for moderate constipation. 12/06/22   Demaris Fillers, MD  famotidine  (PEPCID ) 20 MG tablet Take 1 tablet (20 mg total) by mouth daily. 06/20/23   Del Abron Abt, FNP  famotidine  (PEPCID ) 20 MG tablet Take 1 tablet (20 mg total) by mouth daily. 06/20/23   Del Abron Abt, FNP  feeding supplement (ENSURE ENLIVE / ENSURE PLUS) LIQD Take 237 mLs by mouth 2 (two) times daily between meals. 12/06/22   Demaris Fillers, MD  fexofenadine (ALLEGRA) 180 MG tablet Take 180 mg by mouth daily as needed for allergies or rhinitis.    [provider]  FLUoxetine  (PROZAC ) 20 MG capsule Take 1 capsule (20 mg total) by mouth daily. 06/20/23   Del Orbe Polanco, Iliana, FNP  gabapentin  (NEURONTIN ) 100 MG capsule Take 1 capsule (100 mg total) by mouth 2 (two) times daily. 06/20/23   Del Abron Abt, FNP  magic mouthwash w/lidocaine  SOLN Take 5 mLs by mouth 3 (three) times  daily. Suspension contains equal amounts of Maalox Extra Strength, nystatin , diphenhydramine and lidocaine  x 3 days 12/06/22   Tat, Myrtie Atkinson, MD  melatonin 5 MG TABS Take 5 mg by mouth at bedtime.    [provider]  Menthol , Topical Analgesic, 4 % GEL Apply 1 application  topically 2 (two) times daily. To both legs    [provider]  metoprolol  succinate (TOPROL -XL) 25 MG 24 hr tablet Take 1 tablet (25 mg total) by mouth daily. 06/26/23   Zarwolo, Gloria, FNP  Multiple Vitamin (MULTIVITAMIN WITH MINERALS) TABS tablet Take 1 tablet by mouth daily. 12/06/22   Demaris Fillers, MD  nitroGLYCERIN  (NITROSTAT ) 0.4 MG SL tablet Place 0.4 mg under the tongue every 5 (five)  minutes as needed for chest pain.    [provider]  NON FORMULARY Take 1 Container by mouth 2 (two) times daily. Doctor, hospital, Historical, MD  ondansetron  (ZOFRAN ) 4 MG tablet Take 1 tablet (4 mg total) by mouth every 8 (eight) hours as needed for nausea or vomiting. 06/20/23   Del Amber Bail, Iliana, FNP  ondansetron  (ZOFRAN ) 4 MG tablet Take 1 tablet (4 mg total) by mouth every 12 (twelve) hours as needed for vomiting or nausea. 06/20/23   Del Abron Abt, FNP  phosphorus (K PHOS  NEUTRAL) 155-852-130 MG tablet Take 2 tablets (500 mg total) by mouth 2 (two) times daily. X 4 doses 12/06/22   Tat, Myrtie Atkinson, MD  silver  sulfADIAZINE  (SILVADENE ) 1 % cream Apply 1 Application topically daily. 06/26/23   Zarwolo, Gloria, FNP  simvastatin  (ZOCOR ) 40 MG tablet Take 1 tablet (40 mg total) by mouth daily at 6 PM. 06/20/23   Del Orbe Polanco, Rogerio Clay, FNP  thiamine 100 MG tablet Take 100 mg by mouth daily. 11/17/20   [provider]  traZODone  (DESYREL ) 50 MG tablet Take 1 tablet (50 mg total) by mouth at bedtime as needed. 06/20/23   Del Orbe Polanco, Iliana, FNP    Allergies: Asa [aspirin], Other, and Shrimp (diagnostic)    Review of Systems  Gastrointestinal:  Positive for abdominal pain.    Updated Vital Signs BP 110/79   Pulse 61   Temp 98.1 F (36.7 C) (Oral)   Resp 16   Ht 5' 11 (1.803 m)   Wt 70.7 kg   SpO2 97%   BMI 21.74 kg/m   Physical Exam Vitals and nursing note reviewed.  Constitutional:      General: He is not in acute distress.    Comments: Patient is contracted  HENT:     Head: Normocephalic and atraumatic.   Eyes:     Conjunctiva/sclera: Conjunctivae normal.    Cardiovascular:     Rate and Rhythm: Normal rate and regular rhythm.     Heart sounds: No murmur heard. Pulmonary:     Effort: Pulmonary effort is normal. No respiratory distress.     Breath sounds: Normal breath sounds.  Abdominal:     General: Abdomen is flat.      Palpations: Abdomen is soft.     Tenderness: There is no abdominal tenderness.     Comments: Suprapubic urostomy noted, site is intact with normal granulation tissue.  Green drainage noted on gauze around the catheter.   Musculoskeletal:        General: No swelling.     Cervical back: Neck supple.   Skin:    General: Skin is warm and dry.     Capillary Refill: Capillary refill takes less than  2 seconds.   Neurological:     Mental Status: He is alert.     Comments: Patient is at his baseline  Psychiatric:        Mood and Affect: Mood normal.     (all labs ordered are listed, but only abnormal results are displayed) Labs Reviewed  URINALYSIS, ROUTINE W REFLEX MICROSCOPIC - Abnormal; Notable for the following components:      Result Value   APPearance TURBID (*)    Specific Gravity, Urine 1.004 (*)    Hgb urine dipstick LARGE (*)    Protein, ur 100 (*)    Leukocytes,Ua LARGE (*)    Bacteria, UA MANY (*)    All other components within normal limits  BASIC METABOLIC PANEL WITH GFR - Abnormal; Notable for the following components:   Sodium 133 (*)    Creatinine, Ser 1.40 (*)    GFR, Estimated 51 (*)    All other components within normal limits  URINE CULTURE  CBC WITH DIFFERENTIAL/PLATELET    EKG: None  Radiology: No results found.   BLADDER CATHETERIZATION  Date/Time: 07/10/2023 6:15 PM  Performed by: Aimee Houseman, PA-C Authorized by: Aimee Houseman, PA-C   Consent:    Consent obtained:  Verbal   Consent given by:  Patient   Risks discussed:  False passage, pain, urethral injury and incomplete procedure   Alternatives discussed:  No treatment Universal protocol:    Procedure explained and questions answered to patient or proxy's satisfaction: yes     Patient identity confirmed:  Verbally with patient Pre-procedure details:    Procedure purpose:  Diagnostic Anesthesia:    Anesthesia method:  None Procedure details:    Procedure performed by provider  due to: suprapubic catheter.   Catheter insertion:  Indwelling   Catheter type:  Foley   Catheter size:  20 Fr   Bladder irrigation: no     Number of attempts:  1   Urine characteristics:  Bloody and mildly cloudy Post-procedure details:    Procedure completion:  Tolerated well, no immediate complications    Medications Ordered in the ED  cefTRIAXone (ROCEPHIN) 1 g in sodium chloride  0.9 % 100 mL IVPB (1 g Intravenous New Bag/Given 07/10/23 1757)                                    Medical Decision Making This patient presents to the ED for concern of needs suprapubic catheter changed, this involves an extensive number of treatment options, and is a complaint that carries with it a high risk of complications and morbidity.  The differential diagnosis includes mechanical complication of suprapubic catheter, UTI, sepsis, other   Co morbidities that complicate the patient evaluation :   CVA, patient is bedbound, noncommunicative   Additional history obtained:  Additional history obtained from EMR External records from outside source obtained and reviewed including prior notes, labs, imaging   Lab Tests:  I Ordered, and personally interpreted labs.  The pertinent results include: CBC shows no leukocytosis or anemia, BMP with normal electrolytes and baseline renal function, UA from catheterized specimen shows turbid urine with large blood, large leukocytes, greater than 50 Eble blood cells, greater than 50 red blood cells and many bacteria     Problem List / ED Course / Critical interventions / Medication management  UTI-patient was here for change of his suprapubic Foley but his wife noted some foul drainage  around the catheter site at home.  The urine in the catheter tubing was clear.  Once I exchanged the catheter, turbid urine retained, consistent with UTI.  Patient has had sepsis from UTI and was hospitalized about a month ago at [facility].  I was able to review his prior  culture results showed Proteus, he was sensitive to Rocephin and was sent home on third-generation cephalosporin after that.  Will give abx and advised FU and return precautions.   I have reviewed the patients home medicines and have made adjustments as needed   Social Determinants of Health:  Lives at home with his wife who is his caretaker.    Amount and/or Complexity of Data Reviewed Labs: ordered.  Risk Prescription drug management.        Final diagnoses:  Acute cystitis with hematuria  Chronic suprapubic catheter [facility])    ED Discharge Orders          Ordered    cefpodoxime (VANTIN) 100 MG tablet  2 times daily        07/10/23 1743               Joshua Nieves 07/10/23 Quenten Brunette, MD 07/11/23 1238

## 2023-07-11 ENCOUNTER — Other Ambulatory Visit: Payer: Self-pay | Admitting: Family Medicine

## 2023-07-11 DIAGNOSIS — R110 Nausea: Secondary | ICD-10-CM

## 2023-07-11 MED ORDER — ONDANSETRON HCL 4 MG PO TABS: 4.0000 mg | ORAL_TABLET | Freq: Three times a day (TID) | ORAL | 0 refills | Status: AC | PRN

## 2023-07-11 NOTE — Telephone Encounter (Signed)
 They followed up with urology and put catheter in yesterday!

## 2023-07-11 NOTE — Telephone Encounter (Signed)
 They are filling it now they have received it

## 2023-07-11 NOTE — Telephone Encounter (Signed)
Sandra advised

## 2023-07-12 LAB — URINE CULTURE: Culture: 80000 — AB

## 2023-07-13 DIAGNOSIS — L89212 Pressure ulcer of right hip, stage 2: Secondary | ICD-10-CM | POA: Diagnosis not present

## 2023-07-13 DIAGNOSIS — E119 Type 2 diabetes mellitus without complications: Secondary | ICD-10-CM | POA: Diagnosis not present

## 2023-07-13 DIAGNOSIS — I480 Paroxysmal atrial fibrillation: Secondary | ICD-10-CM | POA: Diagnosis not present

## 2023-07-13 DIAGNOSIS — I110 Hypertensive heart disease with heart failure: Secondary | ICD-10-CM | POA: Diagnosis not present

## 2023-07-13 DIAGNOSIS — A419 Sepsis, unspecified organism: Secondary | ICD-10-CM | POA: Diagnosis not present

## 2023-07-13 DIAGNOSIS — I509 Heart failure, unspecified: Secondary | ICD-10-CM | POA: Diagnosis not present

## 2023-07-13 DIAGNOSIS — N179 Acute kidney failure, unspecified: Secondary | ICD-10-CM | POA: Diagnosis not present

## 2023-07-13 DIAGNOSIS — Z435 Encounter for attention to cystostomy: Secondary | ICD-10-CM | POA: Diagnosis not present

## 2023-07-13 DIAGNOSIS — Z466 Encounter for fitting and adjustment of urinary device: Secondary | ICD-10-CM | POA: Diagnosis not present

## 2023-07-13 NOTE — Progress Notes (Signed)
 ED Antimicrobial Stewardship Positive Culture Follow Up   Dylan Martin is an 78 y.o. male who presented to [facility] on @ADMITDT @ with a chief complaint of  Chief Complaint  Patient presents with   Abdominal Pain    Recent Results (from the past 720 hours)  Microscopic Examination     Status: Abnormal   Collection Time: 06/20/23  1:14 PM  Result Value Ref Range Status   WBC, UA 6-10 (A) 0 - 5 /hpf Final   RBC, Urine None seen 0 - 2 /hpf Final   Epithelial Cells (non renal) 0-10 0 - 10 /hpf Final   Casts None seen None seen /lpf Final   Bacteria, UA None seen None seen/Few Final   Yeast, UA Present (A) None seen Final  Urine Culture, Reflex     Status: None   Collection Time: 06/20/23  1:14 PM  Result Value Ref Range Status   Urine Culture, Routine Final report  Final   Organism ID, Bacteria No growth  Final  Urine Culture     Status: Abnormal   Collection Time: 07/10/23  3:56 PM   Specimen: Urine, Catheterized  Result Value Ref Range Status   Specimen Description   Final    URINE, CATHETERIZED Performed at [facility], [address] [place], [place] [address]    Special Requests   Final    NONE Performed at [facility], [address], [place], [place] [address]    Culture 80,000 COLONIES/mL PSEUDOMONAS AERUGINOSA (A)  Final   Report Status 07/12/2023 FINAL  Final   Organism ID, Bacteria PSEUDOMONAS AERUGINOSA (A)  Final      Susceptibility   Pseudomonas aeruginosa - MIC*    CEFTAZIDIME 4 SENSITIVE Sensitive     CIPROFLOXACIN 0.5 SENSITIVE Sensitive     GENTAMICIN <=1 SENSITIVE Sensitive     IMIPENEM 2 SENSITIVE Sensitive     PIP/TAZO <=4 SENSITIVE Sensitive ug/mL    CEFEPIME  2 SENSITIVE Sensitive     * 80,000 COLONIES/mL PSEUDOMONAS AERUGINOSA    [x]  Treated with cefpodoxime , organism resistant to prescribed antimicrobial  STOP cefpodoxime  START: Ciprofloxacin 500mg  q12h x 5 days (Qty 10; Refills 0)  ED Provider: Darice Showers PA   Dorn Buttner, PharmD,  BCPS 07/13/2023 10:41 AM ED Clinical Pharmacist -  [phone]

## 2023-07-15 ENCOUNTER — Other Ambulatory Visit: Payer: Self-pay | Admitting: Family Medicine

## 2023-07-15 ENCOUNTER — Telehealth: Payer: Self-pay

## 2023-07-15 DIAGNOSIS — I110 Hypertensive heart disease with heart failure: Secondary | ICD-10-CM | POA: Diagnosis not present

## 2023-07-15 DIAGNOSIS — Z466 Encounter for fitting and adjustment of urinary device: Secondary | ICD-10-CM | POA: Diagnosis not present

## 2023-07-15 DIAGNOSIS — E119 Type 2 diabetes mellitus without complications: Secondary | ICD-10-CM | POA: Diagnosis not present

## 2023-07-15 DIAGNOSIS — I480 Paroxysmal atrial fibrillation: Secondary | ICD-10-CM | POA: Diagnosis not present

## 2023-07-15 DIAGNOSIS — N179 Acute kidney failure, unspecified: Secondary | ICD-10-CM | POA: Diagnosis not present

## 2023-07-15 DIAGNOSIS — L89311 Pressure ulcer of right buttock, stage 1: Secondary | ICD-10-CM

## 2023-07-15 DIAGNOSIS — I509 Heart failure, unspecified: Secondary | ICD-10-CM | POA: Diagnosis not present

## 2023-07-15 DIAGNOSIS — A419 Sepsis, unspecified organism: Secondary | ICD-10-CM | POA: Diagnosis not present

## 2023-07-15 DIAGNOSIS — Z435 Encounter for attention to cystostomy: Secondary | ICD-10-CM | POA: Diagnosis not present

## 2023-07-15 DIAGNOSIS — L89212 Pressure ulcer of right hip, stage 2: Secondary | ICD-10-CM | POA: Diagnosis not present

## 2023-07-15 MED ORDER — SILVER SULFADIAZINE 1 % EX CREA: 1.0000 | TOPICAL_CREAM | Freq: Every day | CUTANEOUS | 0 refills | Status: DC

## 2023-07-15 NOTE — Telephone Encounter (Signed)
 Copied from CRM [id number]. Topic: Clinical - Medication Refill >> Jul 15, 2023 12:03 PM Kevelyn M wrote: Medication: silver  sulfADIAZINE  (SILVADENE ) 1 % cream  Has the patient contacted their pharmacy? Yes (Agent: If no, request that the patient contact the pharmacy for the refill. If patient does not wish to contact the pharmacy document the reason why and proceed with request.) (Agent: If yes, when and what did the pharmacy advise?)  This is the patient's preferred pharmacy:   [id number] - [place], [place] - [address] OF [address] [place] [address] Phone: [phone] Fax: [phone]   Is this the correct pharmacy for this prescription? Yes If no, delete pharmacy and type the correct one.   Has the prescription been filled recently? No  Is the patient out of the medication? No  Has the patient been seen for an appointment in the last year OR does the patient have an upcoming appointment? Yes  Can we respond through MyChart? No  Agent: Please be advised that Rx refills may take up to 3 business days. We ask that you follow-up with your pharmacy.

## 2023-07-15 NOTE — Telephone Encounter (Signed)
 Verbal orders provided.

## 2023-07-15 NOTE — Telephone Encounter (Signed)
 Copied from CRM [id number]. Topic: Clinical - Home Health Verbal Orders >> Jul 15, 2023  1:25 PM Zebedee SAUNDERS wrote: Caller/Agency: [facility] per Kristine Rushing Number: [phone] secure line Service Requested: Speech Therapy Frequency: 1x week for 6 weeeks  Any new concerns about the patient? No

## 2023-07-16 DIAGNOSIS — N179 Acute kidney failure, unspecified: Secondary | ICD-10-CM | POA: Diagnosis not present

## 2023-07-16 DIAGNOSIS — Z435 Encounter for attention to cystostomy: Secondary | ICD-10-CM | POA: Diagnosis not present

## 2023-07-16 DIAGNOSIS — I110 Hypertensive heart disease with heart failure: Secondary | ICD-10-CM | POA: Diagnosis not present

## 2023-07-16 DIAGNOSIS — A419 Sepsis, unspecified organism: Secondary | ICD-10-CM | POA: Diagnosis not present

## 2023-07-16 DIAGNOSIS — Z466 Encounter for fitting and adjustment of urinary device: Secondary | ICD-10-CM | POA: Diagnosis not present

## 2023-07-16 DIAGNOSIS — I509 Heart failure, unspecified: Secondary | ICD-10-CM | POA: Diagnosis not present

## 2023-07-16 DIAGNOSIS — I480 Paroxysmal atrial fibrillation: Secondary | ICD-10-CM | POA: Diagnosis not present

## 2023-07-16 DIAGNOSIS — E119 Type 2 diabetes mellitus without complications: Secondary | ICD-10-CM | POA: Diagnosis not present

## 2023-07-16 DIAGNOSIS — L89212 Pressure ulcer of right hip, stage 2: Secondary | ICD-10-CM | POA: Diagnosis not present

## 2023-07-21 ENCOUNTER — Telehealth: Payer: Self-pay

## 2023-07-21 ENCOUNTER — Other Ambulatory Visit: Payer: Self-pay | Admitting: Family Medicine

## 2023-07-21 DIAGNOSIS — E119 Type 2 diabetes mellitus without complications: Secondary | ICD-10-CM | POA: Diagnosis not present

## 2023-07-21 DIAGNOSIS — I110 Hypertensive heart disease with heart failure: Secondary | ICD-10-CM | POA: Diagnosis not present

## 2023-07-21 DIAGNOSIS — Z435 Encounter for attention to cystostomy: Secondary | ICD-10-CM | POA: Diagnosis not present

## 2023-07-21 DIAGNOSIS — R110 Nausea: Secondary | ICD-10-CM

## 2023-07-21 DIAGNOSIS — I509 Heart failure, unspecified: Secondary | ICD-10-CM | POA: Diagnosis not present

## 2023-07-21 DIAGNOSIS — A419 Sepsis, unspecified organism: Secondary | ICD-10-CM | POA: Diagnosis not present

## 2023-07-21 DIAGNOSIS — I480 Paroxysmal atrial fibrillation: Secondary | ICD-10-CM | POA: Diagnosis not present

## 2023-07-21 DIAGNOSIS — N179 Acute kidney failure, unspecified: Secondary | ICD-10-CM | POA: Diagnosis not present

## 2023-07-21 DIAGNOSIS — Z466 Encounter for fitting and adjustment of urinary device: Secondary | ICD-10-CM | POA: Diagnosis not present

## 2023-07-21 DIAGNOSIS — L89212 Pressure ulcer of right hip, stage 2: Secondary | ICD-10-CM | POA: Diagnosis not present

## 2023-07-21 NOTE — Telephone Encounter (Signed)
 Called and left detailed voice mail

## 2023-07-21 NOTE — Telephone Encounter (Signed)
 Copied from CRM [id number]. Topic: Clinical - Home Health Verbal Orders >> Jul 21, 2023  9:32 AM Deaijah H wrote: Caller/Agency: [facility] Callback Number: [phone] Service Requested: Occupational Therapy Frequency: once a week for 2wks.. will address bed mobility and self feeding Any new concerns about the patient? No

## 2023-07-22 DIAGNOSIS — A419 Sepsis, unspecified organism: Secondary | ICD-10-CM | POA: Diagnosis not present

## 2023-07-23 DIAGNOSIS — G4733 Obstructive sleep apnea (adult) (pediatric): Secondary | ICD-10-CM | POA: Diagnosis not present

## 2023-07-23 DIAGNOSIS — N179 Acute kidney failure, unspecified: Secondary | ICD-10-CM | POA: Diagnosis not present

## 2023-07-23 DIAGNOSIS — I509 Heart failure, unspecified: Secondary | ICD-10-CM | POA: Diagnosis not present

## 2023-07-23 DIAGNOSIS — I110 Hypertensive heart disease with heart failure: Secondary | ICD-10-CM | POA: Diagnosis not present

## 2023-07-23 DIAGNOSIS — Z466 Encounter for fitting and adjustment of urinary device: Secondary | ICD-10-CM | POA: Diagnosis not present

## 2023-07-23 DIAGNOSIS — A419 Sepsis, unspecified organism: Secondary | ICD-10-CM | POA: Diagnosis not present

## 2023-07-23 DIAGNOSIS — Z435 Encounter for attention to cystostomy: Secondary | ICD-10-CM | POA: Diagnosis not present

## 2023-07-23 DIAGNOSIS — L89212 Pressure ulcer of right hip, stage 2: Secondary | ICD-10-CM | POA: Diagnosis not present

## 2023-07-23 DIAGNOSIS — I480 Paroxysmal atrial fibrillation: Secondary | ICD-10-CM | POA: Diagnosis not present

## 2023-07-23 DIAGNOSIS — E119 Type 2 diabetes mellitus without complications: Secondary | ICD-10-CM | POA: Diagnosis not present

## 2023-07-24 ENCOUNTER — Telehealth: Payer: Self-pay | Admitting: Family Medicine

## 2023-07-24 DIAGNOSIS — R32 Unspecified urinary incontinence: Secondary | ICD-10-CM | POA: Insufficient documentation

## 2023-07-24 NOTE — Assessment & Plan Note (Addendum)
 The patient reports that symptoms began about several months ago. He is experiencing both urge and stress urinary incontinence and is requesting urinary supplies from [facility]. He was encouraged to begin nonpharmacological interventions, including: Pelvic floor (Kegel) exercises to strengthen bladder control Bladder training by gradually increasing the time between voids Limiting caffeine and alcohol, which may irritate the bladder Maintaining a healthy weight to reduce pressure on the bladder Scheduled toileting to prevent accidents The patient was advised to follow up if symptoms worsen or do not improve with conservative measures.   The patient requires a reclining wheelchair due to the presence of a suprapubic catheter. The patient is currently at home and will be following up with his urologist for catheter replacement.  Provision of the reclining wheelchair will help ensure comfort and proper positioning while managing the catheter care.

## 2023-07-28 DIAGNOSIS — I480 Paroxysmal atrial fibrillation: Secondary | ICD-10-CM | POA: Diagnosis not present

## 2023-07-28 DIAGNOSIS — Z435 Encounter for attention to cystostomy: Secondary | ICD-10-CM | POA: Diagnosis not present

## 2023-07-28 DIAGNOSIS — Z466 Encounter for fitting and adjustment of urinary device: Secondary | ICD-10-CM | POA: Diagnosis not present

## 2023-07-28 DIAGNOSIS — I509 Heart failure, unspecified: Secondary | ICD-10-CM | POA: Diagnosis not present

## 2023-07-28 DIAGNOSIS — I110 Hypertensive heart disease with heart failure: Secondary | ICD-10-CM | POA: Diagnosis not present

## 2023-07-28 DIAGNOSIS — L89212 Pressure ulcer of right hip, stage 2: Secondary | ICD-10-CM | POA: Diagnosis not present

## 2023-07-28 DIAGNOSIS — A419 Sepsis, unspecified organism: Secondary | ICD-10-CM | POA: Diagnosis not present

## 2023-07-28 DIAGNOSIS — E119 Type 2 diabetes mellitus without complications: Secondary | ICD-10-CM | POA: Diagnosis not present

## 2023-07-28 DIAGNOSIS — N179 Acute kidney failure, unspecified: Secondary | ICD-10-CM | POA: Diagnosis not present

## 2023-07-28 NOTE — Telephone Encounter (Signed)
 Faxed ov notes to [facility] at [phone]

## 2023-07-30 DIAGNOSIS — A419 Sepsis, unspecified organism: Secondary | ICD-10-CM | POA: Diagnosis not present

## 2023-07-30 DIAGNOSIS — E119 Type 2 diabetes mellitus without complications: Secondary | ICD-10-CM | POA: Diagnosis not present

## 2023-07-30 DIAGNOSIS — Z435 Encounter for attention to cystostomy: Secondary | ICD-10-CM | POA: Diagnosis not present

## 2023-07-30 DIAGNOSIS — Z466 Encounter for fitting and adjustment of urinary device: Secondary | ICD-10-CM | POA: Diagnosis not present

## 2023-07-30 DIAGNOSIS — N179 Acute kidney failure, unspecified: Secondary | ICD-10-CM | POA: Diagnosis not present

## 2023-07-30 DIAGNOSIS — I110 Hypertensive heart disease with heart failure: Secondary | ICD-10-CM | POA: Diagnosis not present

## 2023-07-30 DIAGNOSIS — L89212 Pressure ulcer of right hip, stage 2: Secondary | ICD-10-CM | POA: Diagnosis not present

## 2023-07-30 DIAGNOSIS — I509 Heart failure, unspecified: Secondary | ICD-10-CM | POA: Diagnosis not present

## 2023-07-30 DIAGNOSIS — I480 Paroxysmal atrial fibrillation: Secondary | ICD-10-CM | POA: Diagnosis not present

## 2023-08-01 DIAGNOSIS — Z466 Encounter for fitting and adjustment of urinary device: Secondary | ICD-10-CM | POA: Diagnosis not present

## 2023-08-01 DIAGNOSIS — A419 Sepsis, unspecified organism: Secondary | ICD-10-CM | POA: Diagnosis not present

## 2023-08-01 DIAGNOSIS — E119 Type 2 diabetes mellitus without complications: Secondary | ICD-10-CM | POA: Diagnosis not present

## 2023-08-01 DIAGNOSIS — I110 Hypertensive heart disease with heart failure: Secondary | ICD-10-CM | POA: Diagnosis not present

## 2023-08-01 DIAGNOSIS — N179 Acute kidney failure, unspecified: Secondary | ICD-10-CM | POA: Diagnosis not present

## 2023-08-01 DIAGNOSIS — I509 Heart failure, unspecified: Secondary | ICD-10-CM | POA: Diagnosis not present

## 2023-08-01 DIAGNOSIS — Z435 Encounter for attention to cystostomy: Secondary | ICD-10-CM | POA: Diagnosis not present

## 2023-08-01 DIAGNOSIS — I480 Paroxysmal atrial fibrillation: Secondary | ICD-10-CM | POA: Diagnosis not present

## 2023-08-01 DIAGNOSIS — L89212 Pressure ulcer of right hip, stage 2: Secondary | ICD-10-CM | POA: Diagnosis not present

## 2023-08-04 DIAGNOSIS — Z466 Encounter for fitting and adjustment of urinary device: Secondary | ICD-10-CM | POA: Diagnosis not present

## 2023-08-04 DIAGNOSIS — I110 Hypertensive heart disease with heart failure: Secondary | ICD-10-CM | POA: Diagnosis not present

## 2023-08-04 DIAGNOSIS — I509 Heart failure, unspecified: Secondary | ICD-10-CM | POA: Diagnosis not present

## 2023-08-04 DIAGNOSIS — E119 Type 2 diabetes mellitus without complications: Secondary | ICD-10-CM | POA: Diagnosis not present

## 2023-08-04 DIAGNOSIS — N179 Acute kidney failure, unspecified: Secondary | ICD-10-CM | POA: Diagnosis not present

## 2023-08-04 DIAGNOSIS — A419 Sepsis, unspecified organism: Secondary | ICD-10-CM | POA: Diagnosis not present

## 2023-08-04 DIAGNOSIS — I480 Paroxysmal atrial fibrillation: Secondary | ICD-10-CM | POA: Diagnosis not present

## 2023-08-04 DIAGNOSIS — Z435 Encounter for attention to cystostomy: Secondary | ICD-10-CM | POA: Diagnosis not present

## 2023-08-04 DIAGNOSIS — L89212 Pressure ulcer of right hip, stage 2: Secondary | ICD-10-CM | POA: Diagnosis not present

## 2023-08-06 ENCOUNTER — Telehealth (HOSPITAL_COMMUNITY): Payer: Self-pay | Admitting: Family Medicine

## 2023-08-06 ENCOUNTER — Telehealth: Payer: Self-pay

## 2023-08-06 ENCOUNTER — Other Ambulatory Visit (HOSPITAL_COMMUNITY): Payer: Self-pay | Admitting: Occupational Therapy

## 2023-08-06 DIAGNOSIS — T17908D Unspecified foreign body in respiratory tract, part unspecified causing other injury, subsequent encounter: Secondary | ICD-10-CM

## 2023-08-06 DIAGNOSIS — R1312 Dysphagia, oropharyngeal phase: Secondary | ICD-10-CM

## 2023-08-06 DIAGNOSIS — I480 Paroxysmal atrial fibrillation: Secondary | ICD-10-CM | POA: Diagnosis not present

## 2023-08-06 DIAGNOSIS — A419 Sepsis, unspecified organism: Secondary | ICD-10-CM | POA: Diagnosis not present

## 2023-08-06 DIAGNOSIS — L89212 Pressure ulcer of right hip, stage 2: Secondary | ICD-10-CM | POA: Diagnosis not present

## 2023-08-06 DIAGNOSIS — I110 Hypertensive heart disease with heart failure: Secondary | ICD-10-CM | POA: Diagnosis not present

## 2023-08-06 DIAGNOSIS — Z466 Encounter for fitting and adjustment of urinary device: Secondary | ICD-10-CM | POA: Diagnosis not present

## 2023-08-06 DIAGNOSIS — R059 Cough, unspecified: Secondary | ICD-10-CM

## 2023-08-06 DIAGNOSIS — E119 Type 2 diabetes mellitus without complications: Secondary | ICD-10-CM | POA: Diagnosis not present

## 2023-08-06 DIAGNOSIS — I509 Heart failure, unspecified: Secondary | ICD-10-CM | POA: Diagnosis not present

## 2023-08-06 DIAGNOSIS — N179 Acute kidney failure, unspecified: Secondary | ICD-10-CM | POA: Diagnosis not present

## 2023-08-06 DIAGNOSIS — Z435 Encounter for attention to cystostomy: Secondary | ICD-10-CM | POA: Diagnosis not present

## 2023-08-06 LAB — CUP PACEART REMOTE DEVICE CHECK
Battery Remaining Longevity: 40 mo
Battery Voltage: 2.93 V
Brady Statistic AP VP Percent: 86.22 %
Brady Statistic AP VS Percent: 0.01 %
Brady Statistic AS VP Percent: 12.73 %
Brady Statistic AS VS Percent: 1.04 %
Brady Statistic RA Percent Paced: 86.35 %
Brady Statistic RV Percent Paced: 98.95 %
Date Time Interrogation Session: 20250716215134
Implantable Lead Connection Status: 753985
Implantable Lead Connection Status: 753985
Implantable Lead Implant Date: 20180327
Implantable Lead Implant Date: 20180327
Implantable Lead Location: 753859
Implantable Lead Location: 753860
Implantable Lead Model: 5076
Implantable Lead Model: 5076
Implantable Pulse Generator Implant Date: 20180327
Lead Channel Impedance Value: 266 Ohm
Lead Channel Impedance Value: 323 Ohm
Lead Channel Impedance Value: 380 Ohm
Lead Channel Impedance Value: 418 Ohm
Lead Channel Pacing Threshold Amplitude: 0.625 V
Lead Channel Pacing Threshold Amplitude: 0.625 V
Lead Channel Pacing Threshold Pulse Width: 0.4 ms
Lead Channel Pacing Threshold Pulse Width: 0.4 ms
Lead Channel Sensing Intrinsic Amplitude: 1.75 mV
Lead Channel Sensing Intrinsic Amplitude: 1.75 mV
Lead Channel Sensing Intrinsic Amplitude: 23.5 mV
Lead Channel Sensing Intrinsic Amplitude: 23.5 mV
Lead Channel Setting Pacing Amplitude: 1.5 V
Lead Channel Setting Pacing Amplitude: 2 V
Lead Channel Setting Pacing Pulse Width: 0.4 ms
Lead Channel Setting Sensing Sensitivity: 1.2 mV
Zone Setting Status: 755011

## 2023-08-06 NOTE — Telephone Encounter (Signed)
 Patient requested OP MBS be done at [facility]. Request sent to [facility]. She will reach out to set-up appointment. (AHARRIS)

## 2023-08-06 NOTE — Telephone Encounter (Signed)
 Copied from CRM [id number]. Topic: General - Other >> Aug 06, 2023 11:35 AM Carlatta H wrote: Reason for CRM: Called to insure that order for a Swallow test was faxed to [facility]//Please insure faxed

## 2023-08-06 NOTE — Telephone Encounter (Signed)
 Order was sent, pt has appt for test on 07/23

## 2023-08-07 ENCOUNTER — Ambulatory Visit: Payer: Medicare PPO

## 2023-08-07 DIAGNOSIS — Z435 Encounter for attention to cystostomy: Secondary | ICD-10-CM | POA: Diagnosis not present

## 2023-08-07 DIAGNOSIS — E119 Type 2 diabetes mellitus without complications: Secondary | ICD-10-CM | POA: Diagnosis not present

## 2023-08-07 DIAGNOSIS — Z466 Encounter for fitting and adjustment of urinary device: Secondary | ICD-10-CM | POA: Diagnosis not present

## 2023-08-07 DIAGNOSIS — N179 Acute kidney failure, unspecified: Secondary | ICD-10-CM | POA: Diagnosis not present

## 2023-08-07 DIAGNOSIS — I480 Paroxysmal atrial fibrillation: Secondary | ICD-10-CM | POA: Diagnosis not present

## 2023-08-07 DIAGNOSIS — I110 Hypertensive heart disease with heart failure: Secondary | ICD-10-CM | POA: Diagnosis not present

## 2023-08-07 DIAGNOSIS — I495 Sick sinus syndrome: Secondary | ICD-10-CM

## 2023-08-07 DIAGNOSIS — A419 Sepsis, unspecified organism: Secondary | ICD-10-CM | POA: Diagnosis not present

## 2023-08-07 DIAGNOSIS — I509 Heart failure, unspecified: Secondary | ICD-10-CM | POA: Diagnosis not present

## 2023-08-07 DIAGNOSIS — L89212 Pressure ulcer of right hip, stage 2: Secondary | ICD-10-CM | POA: Diagnosis not present

## 2023-08-08 ENCOUNTER — Telehealth: Payer: Self-pay | Admitting: Urology

## 2023-08-08 ENCOUNTER — Ambulatory Visit: Payer: Self-pay

## 2023-08-08 NOTE — Telephone Encounter (Signed)
 FYI Only or Action Required?: Action required by provider: refusing disposition, wife urgently requesting meds.  Patient was last seen in primary care on 06/26/2023 by Zarwolo, Gloria, FNP.  Called Nurse Triage reporting Rash, severe itching, interrupted sleep, and water blisters from previous condition per wife.  Symptoms began a week ago.  Interventions attempted: OTC medications: hydrocortisone cream, tea tree oil and Rest, hydration, or home remedies.  Symptoms are: gradually worsening.  Triage Disposition: See HCP Within 4 Hours (Or PCP Triage)  Patient/caregiver understands and will follow disposition?: No, wishes to speak with PCP   Message from [facility] T sent at 08/08/2023  3:14 PM EDT  Patient has a rash on chest, side of stomach, arm, scalp itching- please call wife Almarie [phone]   Reason for Disposition  Large or small blisters on skin (i.e., fluid filled bubbles or sacs)  Answer Assessment - Initial Assessment Questions 1. APPEARANCE of RASH: What does the rash look like? (e.g., blisters, dry flaky skin, red spots, redness, sores)     Looks kinda sunburnish but splotchy, light red, not bright red, medium color Red splotches on sides of chest and stomach on both sides Itching on chest and stomach and sides of stomach, itching can't scratch right arm can bite it, been biting it because can't use right hand due to stroke, splotches on right arm Not changed from yesterday to today 3. LOCATION: Where is the rash located?     Chest, side of stomach, left arm, potentially on scalp Itching to all places though Don't know about rash on scalp but itching there Itching everywhere else 4. COLOR: What color is the rash? (Note: It is difficult to assess rash color in people with darker-colored skin. When this situation occurs, simply ask the caller to describe what they see.)     Medium red 5. ONSET: When did the rash begin?     A week or so 6. FEVER: Do you  have a fever? If Yes, ask: What is your temperature, how was it measured, and when did it start?     no 7. ITCHING: Does the rash itch? If Yes, ask: How bad is the itch? (Scale 1-10; or mild, moderate, severe)     Interrupted sleep even at 4 am Just miserable with this itching Almost every night and in daytime too Been itching a lot even at 4 am and in daytime too when awake Needs some relief Itching to all Scalp at night most of the time 8. CAUSE: What do you think is causing the rash?     Don't know if med causing it 9. MEDICINE FACTORS: Have you started any new medicines within the last 2 weeks? (e.g., antibiotics)      Recently started Vitamin C, B1 complex, need to get iron, just bought zinc, just started zinc today Got tea tree oil on scalp too to help with itching Been using HC 10. OTHER SYMPTOMS: Do you have any other symptoms? (e.g., dizziness, headache, sore throat, joint pain)      Got small water blisters on left elbow that have popped up in last few days and hadn't had any except small one on back, he'll go a while without them, don't know what name of it was Don't know what's causing rash Water blisters at nursing home [place] from internal thing that would pop out with these water blisters large oval on legs and tops of feet sometimes didn't tell me what the cream was, gonna call them too Ruled  out shingles at nursing home Been on zoloft since July 2022, can he come off of that straight away or taper off No oozing No dizziness, headache, sore throat, joint pain, chest pain, SOB, tongue swelling   Has appt 8/1 just want her to call something in right now at [facility], can't get him there right away, just don't feel like he should have to be seen in person because coming in 8/1 Need something sent before aide leaves at 4 pm, need it now, need something stronger for itching right now Don't want to have to take him to UC or ED, not doing that, want her to help She's  helped me in other ways When sees her on 8/1 if something else then can do it then [place] lift to wheelchair Many appts coming up   Advised strongly that pt be examined in next 4 hours, wife refusing exam and requesting meds by 4 pm, advised no guarantee but requesting meds asap right now to calm itching down. Advised seek immediate care for any worsening, wife refusing, wants PCP to handle care. Please advise.  Protocols used: Rash or Redness - [facility]

## 2023-08-08 NOTE — Telephone Encounter (Signed)
 Wife is trying to get a reclining wheelchair to make cath change at home easier. [facility] says they need a Dr to send in note. [facility] fax # [phone]

## 2023-08-11 ENCOUNTER — Ambulatory Visit: Payer: Self-pay | Admitting: Internal Medicine

## 2023-08-11 NOTE — Telephone Encounter (Signed)
 Please advise if this is something you would give orders for or PCP?

## 2023-08-12 ENCOUNTER — Other Ambulatory Visit: Payer: Self-pay | Admitting: Family Medicine

## 2023-08-13 ENCOUNTER — Encounter (HOSPITAL_COMMUNITY): Payer: Self-pay | Admitting: Speech Pathology

## 2023-08-13 ENCOUNTER — Ambulatory Visit (HOSPITAL_COMMUNITY): Attending: Family Medicine | Admitting: Speech Pathology

## 2023-08-13 ENCOUNTER — Other Ambulatory Visit: Payer: Self-pay

## 2023-08-13 ENCOUNTER — Ambulatory Visit (HOSPITAL_COMMUNITY)
Admission: RE | Admit: 2023-08-13 | Discharge: 2023-08-13 | Disposition: A | Discharge status: Home / Self Care | Source: Ambulatory Visit | Attending: Family Medicine | Admitting: Family Medicine

## 2023-08-13 DIAGNOSIS — R1310 Dysphagia, unspecified: Secondary | ICD-10-CM | POA: Diagnosis not present

## 2023-08-13 DIAGNOSIS — R1312 Dysphagia, oropharyngeal phase: Secondary | ICD-10-CM | POA: Insufficient documentation

## 2023-08-13 DIAGNOSIS — R059 Cough, unspecified: Secondary | ICD-10-CM | POA: Diagnosis not present

## 2023-08-13 DIAGNOSIS — T17908D Unspecified foreign body in respiratory tract, part unspecified causing other injury, subsequent encounter: Secondary | ICD-10-CM | POA: Diagnosis not present

## 2023-08-13 DIAGNOSIS — R638 Other symptoms and signs concerning food and fluid intake: Secondary | ICD-10-CM | POA: Diagnosis not present

## 2023-08-13 NOTE — Therapy (Signed)
 Modified Barium Swallow Study  Patient Details  Name: Dylan Martin MRN: [id number] Date of Birth: [birth year]  Today's Date: 08/13/2023   End of Session - 08/13/23 1259     Visit Number 1    Number of Visits 1    Authorization Type [employer]    SLP Start Time 1135    SLP Stop Time  1207    SLP Time Calculation (min) 32 min    Activity Tolerance Patient tolerated treatment well           HPI/PMH: HPI: Ina Poupard is an 78 yo male who was referred for MBSS by Dr. Gloria Zarwolo due to dysphagia s/p ICH 07/27/2020. Pt has been on D3/mech soft and NTL for the past several years. He is currently living at home with his wife. He feeds himself with his left, non-dominant hand.   Clinical Impression: Clinical Impression: Pt presents with min oropharyngeal dysphagia characterized by  Factors that may increase risk of adverse event in presence of aspiration Noe & Lianne 2021): Factors that may increase risk of adverse event in presence of aspiration Noe & Lianne 2021): Limited mobility; Dependence for feeding and/or oral hygiene   Recommendations/Plan: Swallowing Evaluation Recommendations Swallowing Evaluation Recommendations Recommendations: PO diet PO Diet Recommendation: Dysphagia 3 (Mechanical soft); Regular; Thin liquids (Level 0) Liquid Administration via: Cup; No straw Medication Administration: Whole meds with puree Supervision: Patient able to self-feed; Set-up assistance for safety; Full supervision/cueing for swallowing strategies Swallowing strategies  : Slow rate; Small bites/sips; Multiple dry swallows after each bite/sip Postural changes: Position pt fully upright for meals; Stay upright 30-60 min after meals Oral care recommendations: Oral care BID (2x/day)    Treatment Plan Treatment Plan Treatment recommendations: No treatment recommended at this time Follow-up recommendations: No SLP follow up     Recommendations Recommendations for follow up  therapy are one component of a multi-disciplinary discharge planning process, led by the attending physician.  Recommendations may be updated based on patient status, additional functional criteria and insurance authorization.  Assessment: Orofacial Exam: Orofacial Exam Oral Cavity: Oral Hygiene: WFL Oral Cavity - Dentition: Dentures, top; Dentures, bottom Orofacial Anatomy: WFL Oral Motor/Sensory Function: WFL    Anatomy:  Anatomy: WFL   Boluses Administered: Boluses Administered Boluses Administered: Thin liquids (Level 0); Mildly thick liquids (Level 2, nectar thick); Puree; Solid     Oral Impairment Domain: Oral Impairment Domain Lip Closure: Escape from interlabial space or lateral juncture, no extension beyond vermillion border (cup thin) Tongue control during bolus hold: Cohesive bolus between tongue to palatal seal Bolus preparation/mastication: Slow prolonged chewing/mashing with complete recollection Bolus transport/lingual motion: Brisk tongue motion Oral residue: Trace residue lining oral structures Location of oral residue : Tongue Initiation of pharyngeal swallow : Pyriform sinuses (for thins, valleculae for NTL)     Pharyngeal Impairment Domain: Pharyngeal Impairment Domain Soft palate elevation: No bolus between soft palate (SP)/pharyngeal wall (PW) Laryngeal elevation: Complete superior movement of thyroid cartilage with complete approximation of arytenoids to epiglottic petiole Anterior hyoid excursion: Complete anterior movement Epiglottic movement: Complete inversion Laryngeal vestibule closure: Incomplete, narrow column air/contrast in laryngeal vestibule Pharyngeal stripping wave : Present - complete Pharyngeal contraction (A/P view only): N/A Pharyngoesophageal segment opening: Complete distension and complete duration, no obstruction of flow Tongue base retraction: No contrast between tongue base and posterior pharyngeal wall (PPW) Pharyngeal  residue: Complete pharyngeal clearance     Esophageal Impairment Domain: Esophageal Impairment Domain Esophageal clearance upright position: Esophageal retention    Pill:  Pill Consistency administered: Puree Puree: WFL    Penetration/Aspiration Scale Score: Penetration/Aspiration Scale Score 1.  Material does not enter airway: Mildly thick liquids (Level 2, nectar thick); Puree; Solid; Pill 8.  Material enters airway, passes BELOW cords without attempt by patient to eject out (silent aspiration) : Thin liquids (Level 0) (sequential straw sips, ok with single cup sips)    Compensatory Strategies: Compensatory Strategies Compensatory strategies: Yes Straw: Ineffective Ineffective Straw: Thin liquid (Level 0) Multiple swallows: Effective       General Information: Caregiver present: Yes   Diet Prior to this Study: Dysphagia 3 (mechanical soft); Mildly thick liquids (Level 2, nectar thick)    Temperature : Normal    Respiratory Status: WFL    Supplemental O2: None (Room air)    History of Recent Intubation: No   Behavior/Cognition: Alert; Cooperative; Pleasant mood  Self-Feeding Abilities: Able to self-feed  Baseline vocal quality/speech: Normal; Hypophonia/low volume  Volitional Cough: Able to elicit  Volitional Swallow: Able to elicit  Exam Limitations: No limitations  Pain: Pain Assessment Pain Assessment: No/denies pain   SLP visit diagnosis: SLP Visit Diagnosis: Dysphagia, oropharyngeal phase (R13.12)   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HCC)    Aphasia following cerebral infarction    Atherosclerotic heart disease of native coronary artery without angina pectoris    Benign prostatic hyperplasia with lower urinary tract symptoms    Benign prostatic hyperplasia without lower urinary tract symptoms    Bradycardia, unspecified    Cerebral infarction (HCC)    Chronic kidney  disease, stage 3 unspecified (HCC)    Cognitive communication deficit    Dysarthria    Dysphagia    Encephalopathy    Essential (primary) hypertension    Gastro-esophageal reflux disease without esophagitis    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HCC)    Hemiplegia (HCC)    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)    Hyperlipidemia    Hyperosmolality and hypernatremia    Muscle weakness (generalized)    Myocardial infarct, old    Neuromuscular dysfunction of bladder, unspecified    Nontraumatic intracerebral hemorrhage (HCC)    Nontraumatic intracerebral hemorrhage (HCC)    Obstructive sleep apnea    Paroxysmal atrial fibrillation (HCC)    Presence of cardiac pacemaker    Presence of coronary angioplasty implant and graft    Pressure ulcer    Renovascular hypertension    Retention of urine    Seizures (HCC)    TIA (transient ischemic attack)    Type 2 diabetes mellitus without complication (HCC)    Unspecified atrial fibrillation (HCC)    Unspecified disorder of synovium and tendon, right shoulder    Unspecified lack of coordination    Weakness    Past Surgical History:  Past Surgical History:  Procedure Laterality Date   FEMUR SURGERY Left    HIP SURGERY Left    IR GASTROSTOMY TUBE REMOVAL  02/26/2022   SPINAL CORD STIMULATOR IMPLANT     per wife nerve stimulator in back   Thank you,  Lamar Candy, CCC-SLP [phone]  Stpehen Petitjean 08/13/2023, 1:17 PM

## 2023-08-14 ENCOUNTER — Telehealth: Payer: Self-pay | Admitting: Family Medicine

## 2023-08-14 ENCOUNTER — Telehealth: Payer: Self-pay

## 2023-08-14 NOTE — Telephone Encounter (Signed)
 I spoke with patient's wife she states that patient is now home and is no longer in a reclining chair that he had when in the facility.  He is in a standard wheel chair which will make it difficult for SP catheter changes.  I informed her of MD's response to patient not being seen in over a year and to reach out to his PCP for the order.  Patient was seen by PCP 06/2023.  His wife will contact PCP for reclining chair order.

## 2023-08-14 NOTE — Telephone Encounter (Signed)
-----   Message from Garnette HERO Dahlstedt sent at 08/12/2023  1:29 PM EDT ----- I received a message about providing a special chair for this patient for suprapubic tube change.  The patient has not been seen in a year and a half or more.  It would be best to have the family call the PCP for this.

## 2023-08-14 NOTE — Telephone Encounter (Signed)
 I can place the order for this on [facility] if you give the ok to do so - I will just need his last note addended to state why it is medically necessary

## 2023-08-14 NOTE — Telephone Encounter (Signed)
 Copied from CRM [id number]. Topic: General - Other >> Aug 14, 2023  8:55 AM Willma SAUNDERS wrote: Reason for CRM: Patient's wife Almarie calling to see if NP Zarwolo would be able to write an order/prescription with patients condition notes in prescription for them to get the patient a reclining wheelchair from [facility] that will be covered by [facility]. Almarie states he need this due to his Super Pubic Catheter and since he is at home now and will be going to his Urologist to have the catheter changed. Needs order/prescription faxed to [phone]  Almarie can be reached at [phone]

## 2023-08-15 ENCOUNTER — Other Ambulatory Visit: Payer: Self-pay

## 2023-08-15 ENCOUNTER — Other Ambulatory Visit: Payer: Self-pay | Admitting: Family Medicine

## 2023-08-15 DIAGNOSIS — N2889 Other specified disorders of kidney and ureter: Secondary | ICD-10-CM

## 2023-08-18 ENCOUNTER — Inpatient Hospital Stay

## 2023-08-18 ENCOUNTER — Telehealth: Payer: Self-pay

## 2023-08-18 NOTE — Telephone Encounter (Signed)
 Copied from CRM [id number]. Topic: General - Other >> Aug 14, 2023  8:55 AM Willma SAUNDERS wrote: Reason for CRM: Patient's wife Almarie calling to see if NP Zarwolo would be able to write an order/prescription with patients condition notes in prescription for them to get the patient a reclining wheelchair from [employer] [facility] that will be covered by [facility]. Almarie states he need this due to his Super Pubic Catheter and since he is at home now and will be going to his Urologist to have the catheter changed. Needs order/prescription faxed to [phone]  Almarie can be reached at [phone] >> Aug 18, 2023 10:38 AM Ivette P wrote: Pt wife called in to follow up on this order request and would like to know if has been sent. PT wife Already caleld [facility] and they have the chair. Pls follow up with pt wife with status [phone]

## 2023-08-19 ENCOUNTER — Encounter (HOSPITAL_COMMUNITY): Payer: Self-pay

## 2023-08-19 ENCOUNTER — Inpatient Hospital Stay: Attending: Hematology

## 2023-08-19 ENCOUNTER — Other Ambulatory Visit (HOSPITAL_COMMUNITY)

## 2023-08-19 ENCOUNTER — Ambulatory Visit (HOSPITAL_COMMUNITY)
Admission: RE | Admit: 2023-08-19 | Discharge: 2023-08-19 | Disposition: A | Discharge status: Home / Self Care | Source: Ambulatory Visit | Attending: Hematology | Admitting: Hematology

## 2023-08-19 DIAGNOSIS — N2889 Other specified disorders of kidney and ureter: Secondary | ICD-10-CM | POA: Diagnosis not present

## 2023-08-19 DIAGNOSIS — K5900 Constipation, unspecified: Secondary | ICD-10-CM | POA: Diagnosis not present

## 2023-08-19 DIAGNOSIS — I2510 Atherosclerotic heart disease of native coronary artery without angina pectoris: Secondary | ICD-10-CM | POA: Diagnosis not present

## 2023-08-19 DIAGNOSIS — I700 Atherosclerosis of aorta: Secondary | ICD-10-CM | POA: Insufficient documentation

## 2023-08-19 DIAGNOSIS — Z79899 Other long term (current) drug therapy: Secondary | ICD-10-CM | POA: Insufficient documentation

## 2023-08-19 DIAGNOSIS — K5730 Diverticulosis of large intestine without perforation or abscess without bleeding: Secondary | ICD-10-CM | POA: Diagnosis not present

## 2023-08-19 DIAGNOSIS — G4733 Obstructive sleep apnea (adult) (pediatric): Secondary | ICD-10-CM | POA: Diagnosis not present

## 2023-08-19 LAB — COMPREHENSIVE METABOLIC PANEL WITH GFR
ALT: 8 U/L (ref 0–44)
AST: 15 U/L (ref 15–41)
Albumin: 3.5 g/dL (ref 3.5–5.0)
Alkaline Phosphatase: 107 U/L (ref 38–126)
Anion gap: 9 (ref 5–15)
BUN: 17 mg/dL (ref 8–23)
CO2: 24 mmol/L (ref 22–32)
Calcium: 9.6 mg/dL (ref 8.9–10.3)
Chloride: 104 mmol/L (ref 98–111)
Creatinine, Ser: 1.48 mg/dL — ABNORMAL HIGH (ref 0.61–1.24)
GFR, Estimated: 48 mL/min — ABNORMAL LOW (ref >60)
Glucose, Bld: 100 mg/dL — ABNORMAL HIGH (ref 70–99)
Potassium: 3.5 mmol/L (ref 3.5–5.1)
Sodium: 137 mmol/L (ref 135–145)
Total Bilirubin: 0.7 mg/dL (ref 0.0–1.2)
Total Protein: 7.5 g/dL (ref 6.5–8.1)

## 2023-08-19 LAB — CBC WITH DIFFERENTIAL/PLATELET
Abs Immature Granulocytes: 0.04 K/uL (ref 0.00–0.07)
Basophils Absolute: 0.1 K/uL (ref 0.0–0.1)
Basophils Relative: 1 %
Eosinophils Absolute: 1.1 K/uL — ABNORMAL HIGH (ref 0.0–0.5)
Eosinophils Relative: 12 %
HCT: 40.8 % (ref 39.0–52.0)
Hemoglobin: 13.7 g/dL (ref 13.0–17.0)
Immature Granulocytes: 0 %
Lymphocytes Relative: 32 %
Lymphs Abs: 2.9 K/uL (ref 0.7–4.0)
MCH: 30 pg (ref 26.0–34.0)
MCHC: 33.6 g/dL (ref 30.0–36.0)
MCV: 89.5 fL (ref 80.0–100.0)
Monocytes Absolute: 0.8 K/uL (ref 0.1–1.0)
Monocytes Relative: 8 %
Neutro Abs: 4.1 K/uL (ref 1.7–7.7)
Neutrophils Relative %: 47 %
Platelets: 251 K/uL (ref 150–400)
RBC: 4.56 MIL/uL (ref 4.22–5.81)
RDW: 14.5 % (ref 11.5–15.5)
WBC: 9 K/uL (ref 4.0–10.5)
nRBC: 0 % (ref 0.0–0.2)

## 2023-08-19 LAB — LACTATE DEHYDROGENASE: LDH: 136 U/L (ref 98–192)

## 2023-08-19 MED ORDER — IOHEXOL 300 MG/ML  SOLN
100.0000 mL | Freq: Once | INTRAMUSCULAR | Status: AC | PRN
  Administered 2023-08-19: 100 mL via INTRAVENOUS

## 2023-08-21 NOTE — Telephone Encounter (Signed)
 Ordered reclining chair from [employer] and uploaded clinical notes

## 2023-08-22 ENCOUNTER — Telehealth: Payer: Self-pay

## 2023-08-22 ENCOUNTER — Ambulatory Visit (INDEPENDENT_AMBULATORY_CARE_PROVIDER_SITE_OTHER): Admitting: Family Medicine

## 2023-08-22 ENCOUNTER — Encounter: Payer: Self-pay | Admitting: Family Medicine

## 2023-08-22 VITALS — BP 130/88 | HR 71 | Resp 16 | Ht 71.0 in

## 2023-08-22 DIAGNOSIS — M62462 Contracture of muscle, left lower leg: Secondary | ICD-10-CM | POA: Diagnosis not present

## 2023-08-22 DIAGNOSIS — E7849 Other hyperlipidemia: Secondary | ICD-10-CM | POA: Diagnosis not present

## 2023-08-22 DIAGNOSIS — E038 Other specified hypothyroidism: Secondary | ICD-10-CM | POA: Diagnosis not present

## 2023-08-22 DIAGNOSIS — L299 Pruritus, unspecified: Secondary | ICD-10-CM | POA: Diagnosis not present

## 2023-08-22 DIAGNOSIS — R7301 Impaired fasting glucose: Secondary | ICD-10-CM | POA: Diagnosis not present

## 2023-08-22 DIAGNOSIS — M62461 Contracture of muscle, right lower leg: Secondary | ICD-10-CM | POA: Diagnosis not present

## 2023-08-22 DIAGNOSIS — E559 Vitamin D deficiency, unspecified: Secondary | ICD-10-CM | POA: Diagnosis not present

## 2023-08-22 MED ORDER — FEXOFENADINE HCL 180 MG PO TABS
180.0000 mg | ORAL_TABLET | Freq: Every day | ORAL | 1 refills | Status: AC | PRN
Start: 2023-08-22 — End: ?

## 2023-08-22 NOTE — Progress Notes (Addendum)
 Established Patient Office Visit  Subjective:  Patient ID: Dylan Martin, male    DOB: 09/18/1945  Age: 78 y.o. MRN: [id number]  CC:  Chief Complaint  Patient presents with   Diabetes   Hypertension    3 month follow up   Body itching     States he itches pretty bad every evening and she wants to know if fluoxetine  could cause it and if he needs to come off of it or if any of his other meds could be causing it. Has changed to [facility] unscented and baby shampoo for hair. Has been using hydrocortisone cream for it.     HPI Dylan Martin is a 78 y.o. male with past medical history of right hemiparesis, chronic kidney disease presents for f/u of  chronic medical conditions.  For the details of today's visit, please refer to the assessment and plan.    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HCC)    Aphasia following cerebral infarction    Atherosclerotic heart disease of native coronary artery without angina pectoris    Benign prostatic hyperplasia with lower urinary tract symptoms    Benign prostatic hyperplasia without lower urinary tract symptoms    Bradycardia, unspecified    Cerebral infarction (HCC)    Chronic kidney disease, stage 3 unspecified (HCC)    Cognitive communication deficit    Dysarthria    Dysphagia    Encephalopathy    Essential (primary) hypertension    Gastro-esophageal reflux disease without esophagitis    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HCC)    Hemiplegia (HCC)    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)    Hyperlipidemia    Hyperosmolality and hypernatremia    Muscle weakness (generalized)    Myocardial infarct, old    Neuromuscular dysfunction of bladder, unspecified    Nontraumatic intracerebral hemorrhage (HCC)    Nontraumatic intracerebral hemorrhage (HCC)    Obstructive sleep apnea    Paroxysmal atrial fibrillation (HCC)    Presence of cardiac pacemaker     Presence of coronary angioplasty implant and graft    Pressure ulcer    Renovascular hypertension    Retention of urine    Seizures (HCC)    TIA (transient ischemic attack)    Type 2 diabetes mellitus without complication (HCC)    Unspecified atrial fibrillation (HCC)    Unspecified disorder of synovium and tendon, right shoulder    Unspecified lack of coordination    Weakness     Past Surgical History:  Procedure Laterality Date   FEMUR SURGERY Left    HIP SURGERY Left    IR GASTROSTOMY TUBE REMOVAL  02/26/2022   SPINAL CORD STIMULATOR IMPLANT     per wife nerve stimulator in back    No family history on file.  Social History   Socioeconomic History   Marital status: Married    Spouse name: Not on file   Number of children: Not on file   Years of education: Not on file   Highest education level: Not on file  Occupational History   Not on file  Tobacco Use   Smoking status: Never   Smokeless tobacco: Never  Vaping Use   Vaping status: Never Used  Substance and Sexual Activity   Alcohol use: Not Currently   Drug use: Never   Sexual activity: Not on file  Other Topics Concern   Not on file  Social History Narrative   Not on  file   Social Drivers of Health   Financial Resource Strain: Low Risk  (06/09/2023)   Received from [facility]   Overall Financial Resource Strain (CARDIA)    Difficulty of Paying Living Expenses: Not hard at all  Food Insecurity: No Food Insecurity (06/09/2023)   Received from [facility]   Hunger Vital Sign    Within the past 12 months, you worried that your food would run out before you got the money to buy more.: Never true    Within the past 12 months, the food you bought just didn't last and you didn't have money to get more.: Never true  Transportation Needs: No Transportation Needs (06/09/2023)   Received from [facility]   PRAPARE - Transportation    Lack of Transportation (Medical): No    Lack of Transportation  (Non-Medical): No  Physical Activity: Patient Unable To Answer (06/09/2023)   Received from [facility]   Exercise Vital Sign    On average, how many days per week do you engage in moderate to strenuous exercise (like a brisk walk)?: Patient unable to answer    On average, how many minutes do you engage in exercise at this level?: Patient unable to answer  Stress: Patient Unable To Answer (06/09/2023)   Received from [facility] - Occupational Stress Questionnaire    Feeling of Stress : Patient unable to answer  Social Connections: Patient Unable To Answer (06/09/2023)   Received from [facility]   Social Connection and Isolation Panel    In a typical week, how many times do you talk on the phone with family, friends, or neighbors?: Patient unable to answer    How often do you get together with friends or relatives?: Patient unable to answer    How often do you attend church or religious services?: Patient unable to answer    Do you belong to any clubs or organizations such as church groups, unions, fraternal or athletic groups, or school groups?: Patient unable to answer    How often do you attend meetings of the clubs or organizations you belong to?: Patient unable to answer    Are you married, widowed, divorced, separated, never married, or living with a partner?: Patient unable to answer  Intimate Partner Violence: Not At Risk (06/09/2023)   Received from [facility]   Humiliation, Afraid, Rape, and Kick questionnaire    Within the last year, have you been afraid of your partner or ex-partner?: No    Within the last year, have you been humiliated or emotionally abused in other ways by your partner or ex-partner?: No    Within the last year, have you been kicked, hit, slapped, or otherwise physically hurt by your partner or ex-partner?: No    Within the last year, have you been raped or forced to have any kind of sexual activity by  your partner or ex-partner?: No    Outpatient Medications Prior to Visit  Medication Sig Dispense Refill   allopurinol  (ZYLOPRIM ) 100 MG tablet TAKE 1 TABLET (100 MG TOTAL) BY MOUTH DAILY. 60 tablet 5   ascorbic acid (VITAMIN C) 500 MG tablet Take 500 mg by mouth daily.     bisacodyl  (DULCOLAX) 5 MG EC tablet Take 1 tablet (5 mg total) by mouth daily as needed for moderate constipation.     famotidine  (PEPCID ) 20 MG tablet TAKE 1 TABLET (20 MG TOTAL) BY MOUTH DAILY.  60 tablet 5   feeding supplement (ENSURE ENLIVE / ENSURE PLUS) LIQD Take 237 mLs by mouth 2 (two) times daily between meals.     FLUoxetine  (PROZAC ) 20 MG capsule TAKE 1 CAPSULE (20 MG TOTAL) BY MOUTH DAILY. (Patient not taking: Reported on 09/16/2023) 60 capsule 5   simvastatin  (ZOCOR ) 40 MG tablet TAKE 1 TABLET (40 MG TOTAL) BY MOUTH DAILY AT 6 PM. 60 tablet 5   thiamine 100 MG tablet Take 100 mg by mouth daily.     traZODone  (DESYREL ) 50 MG tablet TAKE 1 TABLET (50 MG TOTAL) BY MOUTH AT BEDTIME AS NEEDED. 60 tablet 5   amLODipine  (NORVASC ) 10 MG tablet Take 1 tablet (10 mg total) by mouth daily. 90 tablet 1   Baclofen  5 MG TABS Take 1 tablet (5 mg total) by mouth 3 (three) times daily. 90 tablet 1   gabapentin  (NEURONTIN ) 100 MG capsule Take 1 capsule (100 mg total) by mouth 2 (two) times daily. 60 capsule 1   metoprolol  succinate (TOPROL -XL) 25 MG 24 hr tablet Take 1 tablet (25 mg total) by mouth daily. 90 tablet 3   acetaminophen  (TYLENOL ) 325 MG tablet Take 650 mg by mouth every 4 (four) hours as needed for mild pain or fever.     AMBULATORY NON FORMULARY MEDICATION 1 Device by Does not apply route at bedtime. BiPap     bisacodyl  (DULCOLAX) 10 MG suppository Place 10 mg rectally daily as needed (constipation).     Menthol , Topical Analgesic, 4 % GEL Apply 1 application  topically 2 (two) times daily. To both legs     Multiple Vitamin (MULTIVITAMIN WITH MINERALS) TABS tablet Take 1 tablet by mouth daily.     nitroGLYCERIN   (NITROSTAT ) 0.4 MG SL tablet Place 0.4 mg under the tongue every 5 (five) minutes as needed for chest pain.     NON FORMULARY Take 1 Container by mouth 2 (two) times daily. Magic Cups     ondansetron  (ZOFRAN ) 4 MG tablet Take 1 tablet (4 mg total) by mouth every 12 (twelve) hours as needed for vomiting or nausea. 30 tablet 1   ondansetron  (ZOFRAN ) 4 MG tablet Take 1 tablet (4 mg total) by mouth every 8 (eight) hours as needed for nausea or vomiting. 20 tablet 0   silver  sulfADIAZINE  (SILVADENE ) 1 % cream Apply 1 Application topically daily. 50 g 0   famotidine  (PEPCID ) 20 MG tablet Take 1 tablet (20 mg total) by mouth daily. 30 tablet 1   fexofenadine  (ALLEGRA ) 180 MG tablet Take 180 mg by mouth daily as needed for allergies or rhinitis.     magic mouthwash w/lidocaine  SOLN Take 5 mLs by mouth 3 (three) times daily. Suspension contains equal amounts of Maalox Extra Strength, nystatin , diphenhydramine and lidocaine  x 3 days     melatonin 5 MG TABS Take 5 mg by mouth at bedtime.     phosphorus (K PHOS  NEUTRAL) [id number] MG tablet Take 2 tablets (500 mg total) by mouth 2 (two) times daily. X 4 doses     No facility-administered medications prior to visit.    Allergies  Allergen Reactions   Aspirin Other (See Comments)    Hx of aneurysm, told to avoid by MD  Other Reaction(s): Other (See Comments)    Hx of aneurysm, told to avoid by MD    Per patient cannot take due to aneurysm    dr told pt not to take ASA after aneurysm   Honey Bee Venom    Other  Other (See Comments)    Blood thinners- wife wants documented that patient should not be on blood thinners due to past medical history (Hx of aneurysm)  Red meat - causes gout  Other Reaction(s): Other (See Comments)    Blood thinners- wife wants documented that patient should not be on blood thinners due to past medical history (Hx of aneurysm) Red meat - causes gout   Shrimp (Diagnostic) Other (See Comments)    Gout flare   Shrimp  Extract     Other Reaction(s): Other (See Comments)    Gout flare    ROS Review of Systems  Constitutional:  Negative for fatigue and fever.  Eyes:  Negative for visual disturbance.  Respiratory:  Negative for chest tightness and shortness of breath.   Cardiovascular:  Negative for chest pain and palpitations.  Neurological:  Negative for dizziness and headaches.      Objective:    Physical Exam HENT:     Head: Normocephalic.     Right Ear: External ear normal.     Left Ear: External ear normal.     Nose: No congestion or rhinorrhea.     Mouth/Throat:     Mouth: Mucous membranes are moist.  Cardiovascular:     Rate and Rhythm: Regular rhythm.     Heart sounds: No murmur heard. Pulmonary:     Effort: No respiratory distress.     Breath sounds: Normal breath sounds.  Musculoskeletal:     Comments: shortened and stiff joints of the LE, limited range of motion.  Neurological:     Mental Status: He is alert.     BP 130/88   Pulse 71   Resp 16   Ht 5' 11 (1.803 m)   SpO2 98%   BMI 21.74 kg/m  Wt Readings from Last 3 Encounters:  07/10/23 155 lb 13.8 oz (70.7 kg)  06/23/23 156 lb (70.8 kg)  12/06/22 157 lb 6.5 oz (71.4 kg)    Lab Results  Component Value Date   TSH 3.460 08/29/2023   Lab Results  Component Value Date   WBC 6.7 08/29/2023   HGB 14.9 08/29/2023   HCT 46.0 08/29/2023   MCV 92 08/29/2023   PLT 261 08/29/2023   Lab Results  Component Value Date   NA 139 08/29/2023   K 3.9 08/29/2023   CO2 21 08/29/2023   GLUCOSE 92 08/29/2023   BUN 20 08/29/2023   CREATININE 1.34 (H) 08/29/2023   BILITOT 0.5 08/29/2023   ALKPHOS 130 (H) 08/29/2023   AST 13 08/29/2023   ALT 6 08/29/2023   PROT 7.6 08/29/2023   ALBUMIN 4.1 08/29/2023   CALCIUM 10.2 08/29/2023   ANIONGAP 9 08/19/2023   EGFR 54 (L) 08/29/2023   Lab Results  Component Value Date   CHOL 108 08/29/2023   Lab Results  Component Value Date   HDL 43 08/29/2023   Lab Results   Component Value Date   LDLCALC 51 08/29/2023   Lab Results  Component Value Date   TRIG 67 08/29/2023   Lab Results  Component Value Date   CHOLHDL 2.5 08/29/2023   Lab Results  Component Value Date   HGBA1C 5.5 08/29/2023      Assessment & Plan:  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 He has a history of receiving physical therapy;  however, the therapy did not result in any improvement in the contractures. He continues to have limited mobility in the lower extremities.  Referral placed to [facility] [place]      Orders: -     Ambulatory referral to Physical Therapy  Pruritus Assessment & Plan: Start: Allegra  (fexofenadine ) 180 mg once daily. Taper Off: Prozac  (fluoxetine ) gradually over 1 week.  Use gentle, fragrance-free products such as: Dove Unscented Soap Baby Shampoo (fragrance-free) Apply topical hydrocortisone cream as needed to relieve itching. Use unscented, hypoallergenic moisturizers daily, especially immediately after bathing, to lock in moisture and reduce skin dryness.  Orders: -     Fexofenadine  HCl; Take 1 tablet (180 mg total) by mouth daily as needed for allergies or rhinitis.  Dispense: 30 tablet; Refill: 1  IFG (impaired fasting glucose) -     Hemoglobin A1c  Vitamin D  deficiency -     VITAMIN D  25 Hydroxy (Vit-D Deficiency, Fractures)  TSH (thyroid-stimulating hormone deficiency) -     TSH + free T4  Other hyperlipidemia -     Lipid panel -     CMP14+EGFR -     CBC with Differential/Platelet  Note: This chart has been completed using Engineer, civil (consulting) software, and while attempts have been made to ensure accuracy, certain words and phrases may not be transcribed as intended.    Follow-up: Return in about 5 months (around 01/22/2024).   Orie Cuttino, FNP

## 2023-08-22 NOTE — Telephone Encounter (Signed)
 Copied from CRM [id number]. Topic: Clinical - Request for Lab/Test Order >> Aug 22, 2023  1:14 PM Tobias L wrote: Reason for CRM: Wife states she forgot to mention that patient had labs done yesterday for Dr. Rogers. Wife inquiring if Meade could look at Dr. Katherleen labs from the 4th floor at [facility] and use those as well. Wife unsure if insurance will cover labs that are close together.   Wife states if they are not labs that can be used she will try to get patient in for the labs on Monday 08/25/23.   Please advise and reach out to Wife, [phone]

## 2023-08-22 NOTE — Progress Notes (Incomplete Revision)
 Established Patient Office Visit  Subjective:  Patient ID: Dylan Martin, male    DOB: 1945-11-21  Age: 78 y.o. MRN: [id number]  CC:  Chief Complaint  Patient presents with  . Diabetes  . Hypertension    3 month follow up  . Body itching     States he itches pretty bad every evening and she wants to know if fluoxetine  could cause it and if he needs to come off of it or if any of his other meds could be causing it. Has changed to [facility] unscented and baby shampoo for hair. Has been using hydrocortisone cream for it.     HPI Dylan Martin is a 78 y.o. male with past medical history of right hemiparesis, chronic kidney disease presents for f/u of  chronic medical conditions.  For the details of today's visit, please refer to the assessment and plan.    Past Medical History:  Diagnosis Date  . Aneurysm of other specified arteries (HCC)   . Aphasia following cerebral infarction   . Atherosclerotic heart disease of native coronary artery without angina pectoris   . Benign prostatic hyperplasia with lower urinary tract symptoms   . Benign prostatic hyperplasia without lower urinary tract symptoms   . Bradycardia, unspecified   . Cerebral infarction (HCC)   . Chronic kidney disease, stage 3 unspecified (HCC)   . Cognitive communication deficit   . Dysarthria   . Dysphagia   . Encephalopathy   . Essential (primary) hypertension   . Gastro-esophageal reflux disease without esophagitis   . 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HCC)   . Hemiplegia (HCC)   . 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 Hyperlipidemia   . Hyperosmolality and hypernatremia   . Muscle weakness (generalized)   . Myocardial infarct, old   . Neuromuscular dysfunction of bladder, unspecified   . Nontraumatic intracerebral hemorrhage (HCC)   . Nontraumatic intracerebral hemorrhage (HCC)   . Obstructive sleep apnea   . Paroxysmal atrial fibrillation (HCC)   .  Presence of cardiac pacemaker   . Presence of coronary angioplasty implant and graft   . Pressure ulcer   . Renovascular hypertension   . Retention of urine   . Seizures (HCC)   . TIA (transient ischemic attack)   . Type 2 diabetes mellitus without complication (HCC)   . Unspecified atrial fibrillation (HCC)   . Unspecified disorder of synovium and tendon, right shoulder   . Unspecified lack of coordination   . Weakness     Past Surgical History:  Procedure Laterality Date  . FEMUR SURGERY Left   . HIP SURGERY Left   . IR GASTROSTOMY TUBE REMOVAL  02/26/2022  . SPINAL CORD STIMULATOR IMPLANT     per wife nerve stimulator in back    No family history on file.  Social History   Socioeconomic History  . Marital status: Married    Spouse name: Not on file  . Number of children: Not on file  . Years of education: Not on file  . Highest education level: Not on file  Occupational History  . Not on file  Tobacco Use  . Smoking status: Never  . Smokeless tobacco: Never  Vaping Use  . Vaping status: Never Used  Substance and Sexual Activity  . Alcohol use: Not Currently  . Drug use: Never  . Sexual activity: Not on file  Other Topics Concern  . Not on file  Social History Narrative  . Not on  file   Social Drivers of Health   Financial Resource Strain: Low Risk  (06/09/2023)   Received from [facility]   Overall Financial Resource Strain (CARDIA)   . Difficulty of Paying Living Expenses: Not hard at all  Food Insecurity: No Food Insecurity (06/09/2023)   Received from [facility]   Hunger Vital Sign   . Within the past 12 months, you worried that your food would run out before you got the money to buy more.: Never true   . Within the past 12 months, the food you bought just didn't last and you didn't have money to get more.: Never true  Transportation Needs: No Transportation Needs (06/09/2023)   Received from [facility] - Transportation   . Lack  of Transportation (Medical): No   . Lack of Transportation (Non-Medical): No  Physical Activity: Patient Unable To Answer (06/09/2023)   Received from [facility]   Exercise Vital Sign   . On average, how many days per week do you engage in moderate to strenuous exercise (like a brisk walk)?: Patient unable to answer   . On average, how many minutes do you engage in exercise at this level?: Patient unable to answer  Stress: Patient Unable To Answer (06/09/2023)   Received from [facility] - Occupational Stress Questionnaire   . Feeling of Stress : Patient unable to answer  Social Connections: Patient Unable To Answer (06/09/2023)   Received from [facility]   Social Connection and Isolation Panel   . In a typical week, how many times do you talk on the phone with family, friends, or neighbors?: Patient unable to answer   . How often do you get together with friends or relatives?: Patient unable to answer   . How often do you attend church or religious services?: Patient unable to answer   . Do you belong to any clubs or organizations such as church groups, unions, fraternal or athletic groups, or school groups?: Patient unable to answer   . How often do you attend meetings of the clubs or organizations you belong to?: Patient unable to answer   . Are you married, widowed, divorced, separated, never married, or living with a partner?: Patient unable to answer  Intimate Partner Violence: Not At Risk (06/09/2023)   Received from [facility]   Humiliation, Afraid, Rape, and Kick questionnaire   . Within the last year, have you been afraid of your partner or ex-partner?: No   . Within the last year, have you been humiliated or emotionally abused in other ways by your partner or ex-partner?: No   . Within the last year, have you been kicked, hit, slapped, or otherwise physically hurt by your partner or ex-partner?: No   . Within the last  year, have you been raped or forced to have any kind of sexual activity by your partner or ex-partner?: No    Outpatient Medications Prior to Visit  Medication Sig Dispense Refill  . allopurinol  (ZYLOPRIM ) 100 MG tablet TAKE 1 TABLET (100 MG TOTAL) BY MOUTH DAILY. 60 tablet 5  . amLODipine  (NORVASC ) 10 MG tablet Take 1 tablet (10 mg total) by mouth daily. 90 tablet 1  . ascorbic acid (VITAMIN C) 500 MG tablet Take 500 mg by mouth daily.    . bisacodyl  (DULCOLAX) 5 MG EC tablet Take 1 tablet (5 mg total) by mouth daily as needed for moderate  constipation.    . famotidine  (PEPCID ) 20 MG tablet TAKE 1 TABLET (20 MG TOTAL) BY MOUTH DAILY. 60 tablet 5  . feeding supplement (ENSURE ENLIVE / ENSURE PLUS) LIQD Take 237 mLs by mouth 2 (two) times daily between meals.    . FLUoxetine  (PROZAC ) 20 MG capsule TAKE 1 CAPSULE (20 MG TOTAL) BY MOUTH DAILY. 60 capsule 5  . gabapentin  (NEURONTIN ) 100 MG capsule Take 1 capsule (100 mg total) by mouth 2 (two) times daily. 60 capsule 1  . metoprolol  succinate (TOPROL -XL) 25 MG 24 hr tablet Take 1 tablet (25 mg total) by mouth daily. 90 tablet 3  . simvastatin  (ZOCOR ) 40 MG tablet TAKE 1 TABLET (40 MG TOTAL) BY MOUTH DAILY AT 6 PM. 60 tablet 5  . thiamine 100 MG tablet Take 100 mg by mouth daily.    . traZODone  (DESYREL ) 50 MG tablet TAKE 1 TABLET (50 MG TOTAL) BY MOUTH AT BEDTIME AS NEEDED. 60 tablet 5  . Baclofen  5 MG TABS Take 1 tablet (5 mg total) by mouth 3 (three) times daily. 90 tablet 1  . acetaminophen  (TYLENOL ) 325 MG tablet Take 650 mg by mouth every 4 (four) hours as needed for mild pain or fever.    . AMBULATORY NON FORMULARY MEDICATION 1 Device by Does not apply route at bedtime. BiPap    . bisacodyl  (DULCOLAX) 10 MG suppository Place 10 mg rectally daily as needed (constipation).    . Menthol , Topical Analgesic, 4 % GEL Apply 1 application  topically 2 (two) times daily. To both legs    . Multiple Vitamin (MULTIVITAMIN WITH MINERALS) TABS tablet Take  1 tablet by mouth daily.    . nitroGLYCERIN  (NITROSTAT ) 0.4 MG SL tablet Place 0.4 mg under the tongue every 5 (five) minutes as needed for chest pain.    . NON FORMULARY Take 1 Container by mouth 2 (two) times daily. Magic Cups    . ondansetron  (ZOFRAN ) 4 MG tablet Take 1 tablet (4 mg total) by mouth every 12 (twelve) hours as needed for vomiting or nausea. 30 tablet 1  . ondansetron  (ZOFRAN ) 4 MG tablet Take 1 tablet (4 mg total) by mouth every 8 (eight) hours as needed for nausea or vomiting. 20 tablet 0  . silver  sulfADIAZINE  (SILVADENE ) 1 % cream Apply 1 Application topically daily. 50 g 0  . famotidine  (PEPCID ) 20 MG tablet Take 1 tablet (20 mg total) by mouth daily. 30 tablet 1  . fexofenadine  (ALLEGRA ) 180 MG tablet Take 180 mg by mouth daily as needed for allergies or rhinitis.    . magic mouthwash w/lidocaine  SOLN Take 5 mLs by mouth 3 (three) times daily. Suspension contains equal amounts of Maalox Extra Strength, nystatin , diphenhydramine and lidocaine  x 3 days    . melatonin 5 MG TABS Take 5 mg by mouth at bedtime.    . phosphorus (K PHOS  NEUTRAL) [id number] MG tablet Take 2 tablets (500 mg total) by mouth 2 (two) times daily. X 4 doses     No facility-administered medications prior to visit.    Allergies  Allergen Reactions  . Aspirin Other (See Comments)    Hx of aneurysm, told to avoid by MD  Other Reaction(s): Other (See Comments)    Hx of aneurysm, told to avoid by MD    Per patient cannot take due to aneurysm    dr told pt not to take ASA after aneurysm  . Honey Bee Venom   . Other Other (See Comments)  Blood thinners- wife wants documented that patient should not be on blood thinners due to past medical history (Hx of aneurysm)  Red meat - causes gout  Other Reaction(s): Other (See Comments)    Blood thinners- wife wants documented that patient should not be on blood thinners due to past medical history (Hx of aneurysm) Red meat - causes gout  . Shrimp  (Diagnostic) Other (See Comments)    Gout flare  . Shrimp Extract     Other Reaction(s): Other (See Comments)    Gout flare    ROS Review of Systems    Objective:    Physical Exam HENT:     Head: Normocephalic.     Right Ear: External ear normal.     Left Ear: External ear normal.     Nose: No congestion or rhinorrhea.     Mouth/Throat:     Mouth: Mucous membranes are moist.  Cardiovascular:     Rate and Rhythm: Regular rhythm.     Heart sounds: No murmur heard. Pulmonary:     Effort: No respiratory distress.     Breath sounds: Normal breath sounds.  Neurological:     Mental Status: He is alert.     BP 130/88   Pulse 71   Resp 16   Ht 5' 11 (1.803 m)   SpO2 98%   BMI 21.74 kg/m  Wt Readings from Last 3 Encounters:  07/10/23 155 lb 13.8 oz (70.7 kg)  06/23/23 156 lb (70.8 kg)  12/06/22 157 lb 6.5 oz (71.4 kg)    Lab Results  Component Value Date   TSH 1.210 07/16/2021   Lab Results  Component Value Date   WBC 9.0 08/19/2023   HGB 13.7 08/19/2023   HCT 40.8 08/19/2023   MCV 89.5 08/19/2023   PLT 251 08/19/2023   Lab Results  Component Value Date   NA 137 08/19/2023   K 3.5 08/19/2023   CO2 24 08/19/2023   GLUCOSE 100 (H) 08/19/2023   BUN 17 08/19/2023   CREATININE 1.48 (H) 08/19/2023   BILITOT 0.7 08/19/2023   ALKPHOS 107 08/19/2023   AST 15 08/19/2023   ALT 8 08/19/2023   PROT 7.5 08/19/2023   ALBUMIN 3.5 08/19/2023   CALCIUM 9.6 08/19/2023   ANIONGAP 9 08/19/2023   EGFR 50 (L) 06/20/2023   Lab Results  Component Value Date   CHOL 90 (L) 07/16/2021   Lab Results  Component Value Date   HDL 32 (L) 07/16/2021   Lab Results  Component Value Date   LDLCALC 39 07/16/2021   Lab Results  Component Value Date   TRIG 102 07/16/2021   Lab Results  Component Value Date   CHOLHDL 2.8 07/16/2021   Lab Results  Component Value Date   HGBA1C 5.5 12/01/2022      Assessment & Plan:  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 He has a history of receiving physical therapy; however, the therapy did not result in any improvement in the contractures. He continues to have limited mobility in the lower extremities.  [facility] to [facility] [place]      Orders: -     Ambulatory referral to Physical Therapy  Pruritus Assessment & Plan: Start: Allegra  (fexofenadine ) 180 mg once daily. Taper Off: Prozac  (fluoxetine ) gradually over 1 week.  Use  gentle, fragrance-free products such as: Dove Unscented Soap Baby Shampoo (fragrance-free) Apply topical hydrocortisone cream as needed to relieve itching. Use unscented, hypoallergenic moisturizers daily, especially immediately after bathing, to lock in moisture and reduce skin dryness.  Orders: -     Fexofenadine  HCl; Take 1 tablet (180 mg total) by mouth daily as needed for allergies or rhinitis.  Dispense: 30 tablet; Refill: 1  IFG (impaired fasting glucose) -     Hemoglobin A1c  Vitamin D  deficiency -     VITAMIN D  25 Hydroxy (Vit-D Deficiency, Fractures)  TSH (thyroid-stimulating hormone deficiency) -     TSH + free T4  Other hyperlipidemia -     Lipid panel -     CMP14+EGFR -     CBC with Differential/Platelet  Note: This chart has been completed using Engineer, civil (consulting) software, and while attempts have been made to ensure accuracy, certain words and phrases may not be transcribed as intended.    Follow-up: Return in about 5 months (around 01/22/2024).   Dathan Attia, FNP

## 2023-08-22 NOTE — Patient Instructions (Addendum)
 I appreciate the opportunity to provide care to you today!    Follow up:  5 months  Labs: please stop by the lab during the week to get your blood drawn (CBC, CMP, TSH, Lipid profile, HgA1c, Vit D)  Body Itching Start: Allegra (fexofenadine) 180 mg once daily. Taper Off: Prozac  (fluoxetine ) gradually over 1 week.  Use gentle, fragrance-free products such as: Dove Unscented Soap Baby Shampoo (fragrance-free) Apply topical hydrocortisone cream as needed to relieve itching. Use unscented, hypoallergenic moisturizers daily, especially immediately after bathing, to lock in moisture and reduce skin dryness.  Please follow up if your symptoms worsen or fail to improve.    Referrals today-  PT ([facility] in [place])    Please continue to a heart-healthy diet and increase your physical activities. Try to exercise for at least five days a week.    It was a pleasure to see you and I look forward to continuing to work together on your health and well-being. Please do not hesitate to call the office if you need care or have questions about your care.  In case of emergency, please visit the Emergency Department for urgent care, or contact our clinic at [phone] to schedule an appointment. We're here to help you!   Have a wonderful day and week. With Gratitude, Jahaziel Francois MSN, FNP-BC

## 2023-08-25 ENCOUNTER — Inpatient Hospital Stay: Attending: Hematology | Admitting: Hematology

## 2023-08-25 VITALS — BP 133/83 | HR 67 | Resp 16

## 2023-08-25 DIAGNOSIS — I1310 Hypertensive heart and chronic kidney disease without heart failure, with stage 1 through stage 4 chronic kidney disease, or unspecified chronic kidney disease: Secondary | ICD-10-CM | POA: Insufficient documentation

## 2023-08-25 DIAGNOSIS — Z931 Gastrostomy status: Secondary | ICD-10-CM | POA: Insufficient documentation

## 2023-08-25 DIAGNOSIS — N1832 Chronic kidney disease, stage 3b: Secondary | ICD-10-CM | POA: Diagnosis not present

## 2023-08-25 DIAGNOSIS — I3139 Other pericardial effusion (noninflammatory): Secondary | ICD-10-CM | POA: Insufficient documentation

## 2023-08-25 DIAGNOSIS — Z886 Allergy status to analgesic agent status: Secondary | ICD-10-CM | POA: Diagnosis not present

## 2023-08-25 DIAGNOSIS — Z8673 Personal history of transient ischemic attack (TIA), and cerebral infarction without residual deficits: Secondary | ICD-10-CM | POA: Insufficient documentation

## 2023-08-25 DIAGNOSIS — I700 Atherosclerosis of aorta: Secondary | ICD-10-CM | POA: Insufficient documentation

## 2023-08-25 DIAGNOSIS — I2510 Atherosclerotic heart disease of native coronary artery without angina pectoris: Secondary | ICD-10-CM | POA: Diagnosis not present

## 2023-08-25 DIAGNOSIS — N319 Neuromuscular dysfunction of bladder, unspecified: Secondary | ICD-10-CM | POA: Diagnosis not present

## 2023-08-25 DIAGNOSIS — N401 Enlarged prostate with lower urinary tract symptoms: Secondary | ICD-10-CM | POA: Diagnosis not present

## 2023-08-25 DIAGNOSIS — Z79899 Other long term (current) drug therapy: Secondary | ICD-10-CM | POA: Diagnosis not present

## 2023-08-25 DIAGNOSIS — R1312 Dysphagia, oropharyngeal phase: Secondary | ICD-10-CM | POA: Diagnosis not present

## 2023-08-25 DIAGNOSIS — R911 Solitary pulmonary nodule: Secondary | ICD-10-CM | POA: Diagnosis not present

## 2023-08-25 DIAGNOSIS — E785 Hyperlipidemia, unspecified: Secondary | ICD-10-CM | POA: Insufficient documentation

## 2023-08-25 DIAGNOSIS — M47814 Spondylosis without myelopathy or radiculopathy, thoracic region: Secondary | ICD-10-CM | POA: Diagnosis not present

## 2023-08-25 DIAGNOSIS — N2889 Other specified disorders of kidney and ureter: Secondary | ICD-10-CM | POA: Insufficient documentation

## 2023-08-25 DIAGNOSIS — Z91030 Bee allergy status: Secondary | ICD-10-CM | POA: Insufficient documentation

## 2023-08-25 DIAGNOSIS — I480 Paroxysmal atrial fibrillation: Secondary | ICD-10-CM | POA: Diagnosis not present

## 2023-08-25 DIAGNOSIS — E1122 Type 2 diabetes mellitus with diabetic chronic kidney disease: Secondary | ICD-10-CM | POA: Diagnosis not present

## 2023-08-25 DIAGNOSIS — I252 Old myocardial infarction: Secondary | ICD-10-CM | POA: Diagnosis not present

## 2023-08-25 NOTE — Progress Notes (Signed)
 [facility] [address], [place] [address]    Clinic Day:  08/25/2023  Referring physician: Zarwolo, Gloria, FNP  Patient Care Team: Zarwolo, Gloria, FNP as PCP - General (Family Medicine) Waddell Danelle ORN, MD as PCP - Electrophysiology (Cardiology) Rogers Hai, MD as Medical Oncologist (Medical Oncology) Celestia Joesph SQUIBB, RN as Oncology Nurse Navigator (Medical Oncology)   ASSESSMENT & PLAN:   Assessment: 1.  Bilateral kidney masses: - Patient seen at the request of Dr. Roseann for further evaluation of kidney masses. - CT abdomen with and without contrast (08/09/2021): 4.1 x 4 x 4.6 cm mass in the posterior lower interpolar right kidney, extending into the central sinus fat.  Second tiny enhancing lesion in the anterior lower pole of the right kidney measuring 8 mm.  2.1 x 2.3 x 2 cm heterogeneously enhancing mass in the posterior interpolar left kidney.  10 x 6 mm left lower lobe lung nodule nonspecific. - Dr. Roseann did not think he was a surgical candidate. - PET scan (09/07/2021): 4.3 cm right kidney mass, SUV 4.4.  8 mm enhancing lesion in the lower pole of the right kidney, below PET resolution.  2.2 cm lesion in the posterior interpolar left kidney with no abnormal hypermetabolic some.  No abnormal uptake in the bones.  2.  Social/family history: - Patient resident at [facility] nursing home since 05/04/2021.  He requires full care.  He had a history of hemorrhagic stroke and neurogenic bladder.  His wife visits him at the nursing home per the attendant.  He used to build homes prior to retirement.  Non-smoker. - Family history consistent with brother with cancer  Plan: 1.  Right kidney masses: - He was discharged from [facility] nursing home to home on 06/12/2023.  His wife is taking care of him.  He is also getting rehab at home. - We reviewed labs from 08/19/2023: Creatinine 1.48 and stable.  Rest of the LFTs are normal.  CBC normal.   LDH normal. - Reviewed CTAP from 08/19/2023: Mild increase in the bilateral renal masses by 1-4 mm from the last scan.  No evidence of metastatic disease. - Patient's wife would like to continue follow-ups with her once a year imaging.  RTC 1 year for follow-up with repeat scan.    Orders Placed This Encounter  Procedures   CT ABDOMEN PELVIS W CONTRAST    Standing Status:   Future    Expected Date:   08/16/2024    Expiration Date:   08/24/2024    If indicated for the ordered procedure, I authorize the administration of contrast media per Radiology protocol:   Yes    Does the patient have a contrast media/X-ray dye allergy?:   No    Preferred imaging location?:   [facility]    Release to patient:   Immediate [1]    If indicated for the ordered procedure, I authorize the administration of oral contrast media per Radiology protocol:   Yes   CBC with Differential    Standing Status:   Future    Expected Date:   08/16/2024    Expiration Date:   08/24/2024   Comprehensive metabolic panel    Standing Status:   Future    Expected Date:   08/16/2024    Expiration Date:   08/24/2024   Lactate dehydrogenase    Standing Status:   Future    Expected Date:   08/16/2024    Expiration Date:  08/24/2024      I,Helena R Teague,acting as a scribe for Alean Stands, MD.,have documented all relevant documentation on the behalf of Alean Stands, MD,as directed by  Alean Stands, MD while in the presence of Alean Stands, MD.  I, Alean Stands MD, have reviewed the above documentation for accuracy and completeness, and I agree with the above.    Alean Stands, MD   8/4/20251:12 PM  CHIEF COMPLAINT:   Diagnosis:  bilateral kidney masses  Prior Therapy: Left kidney mass ablation few years ago   Current Therapy:   Observation    HISTORY OF PRESENT ILLNESS:   Oncology History   No history exists.     INTERVAL HISTORY:   Dylan Martin is a 78 y.o. male  presenting to clinic today for follow up of  bilateral kidney masses. [name] He was last seen by me on 03/14/2022.  Since his last visit, he underwent CT AP on 08/19/2023 that found: Increased size of bilateral renal masses measuring up to 5.5 cm on the right in 3.1 cm on the left, compatible with renal cell carcinoma. Stable small bilateral renal neoplasms. Stable 4 mm right lower lobe pulmonary nodule. Diffuse bladder wall thickening, possible infectious or inflammatory cystitis. Suprapubic bladder catheter in place. Large amount of retained stool in the rectum with possible fecal ball. Coronary artery calcifications and aortic atherosclerosis.  Of note, [name] was admitted to the hospital from 06/07/2023 to 06/12/2023 for sepsis secondary to UTI and an obstructed foley catheter.   Today, he states that he is doing well overall. His appetite level is at 100%. His energy level is at 50%.  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HCC)    Aphasia following cerebral infarction    Atherosclerotic heart disease of native coronary artery without angina pectoris    Benign prostatic hyperplasia with lower urinary tract symptoms    Benign prostatic hyperplasia without lower urinary tract symptoms    Bradycardia, unspecified    Cerebral infarction (HCC)    Chronic kidney disease, stage 3 unspecified (HCC)    Cognitive communication deficit    Dysarthria    Dysphagia    Encephalopathy    Essential (primary) hypertension    Gastro-esophageal reflux disease without esophagitis    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HCC)    Hemiplegia (HCC)    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)    Hyperlipidemia    Hyperosmolality and hypernatremia    Muscle weakness (generalized)    Myocardial infarct, old    Neuromuscular dysfunction of bladder, unspecified    Nontraumatic  intracerebral hemorrhage (HCC)    Nontraumatic intracerebral hemorrhage (HCC)    Obstructive sleep apnea    Paroxysmal atrial fibrillation (HCC)    Presence of cardiac pacemaker    Presence of coronary angioplasty implant and graft    Pressure ulcer    Renovascular hypertension    Retention of urine    Seizures (HCC)    TIA (transient ischemic attack)    Type 2 diabetes mellitus without complication (HCC)    Unspecified atrial fibrillation (HCC)    Unspecified disorder of synovium and tendon, right shoulder    Unspecified lack of coordination    Weakness     Surgical History: Past Surgical History:  Procedure Laterality Date   FEMUR SURGERY Left    HIP SURGERY Left    IR GASTROSTOMY TUBE REMOVAL  02/26/2022   SPINAL  CORD STIMULATOR IMPLANT     per wife nerve stimulator in back    Social History: Social History   Socioeconomic History   Marital status: Married    Spouse name: Not on file   Number of children: Not on file   Years of education: Not on file   Highest education level: Not on file  Occupational History   Not on file  Tobacco Use   Smoking status: Never   Smokeless tobacco: Never  Vaping Use   Vaping status: Never Used  Substance and Sexual Activity   Alcohol use: Not Currently   Drug use: Never   Sexual activity: Not on file  Other Topics Concern   Not on file  Social History Narrative   Not on file   Social Drivers of Health   Financial Resource Strain: Low Risk  (06/09/2023)   Received from [facility]   Overall Financial Resource Strain (CARDIA)    Difficulty of Paying Living Expenses: Not hard at all  Food Insecurity: No Food Insecurity (06/09/2023)   Received from [facility]   Hunger Vital Sign    Within the past 12 months, you worried that your food would run out before you got the money to buy more.: Never true    Within the past 12 months, the food you bought just didn't last and you didn't have money to get more.: Never true   Transportation Needs: No Transportation Needs (06/09/2023)   Received from [facility]   PRAPARE - Transportation    Lack of Transportation (Medical): No    Lack of Transportation (Non-Medical): No  Physical Activity: Patient Unable To Answer (06/09/2023)   Received from [facility]   Exercise Vital Sign    On average, how many days per week do you engage in moderate to strenuous exercise (like a brisk walk)?: Patient unable to answer    On average, how many minutes do you engage in exercise at this level?: Patient unable to answer  Stress: Patient Unable To Answer (06/09/2023)   Received from [facility] - Occupational Stress Questionnaire    Feeling of Stress : Patient unable to answer  Social Connections: Patient Unable To Answer (06/09/2023)   Received from [facility]   Social Connection and Isolation Panel    In a typical week, how many times do you talk on the phone with family, friends, or neighbors?: Patient unable to answer    How often do you get together with friends or relatives?: Patient unable to answer    How often do you attend church or religious services?: Patient unable to answer    Do you belong to any clubs or organizations such as church groups, unions, fraternal or athletic groups, or school groups?: Patient unable to answer    How often do you attend meetings of the clubs or organizations you belong to?: Patient unable to answer    Are you married, widowed, divorced, separated, never married, or living with a partner?: Patient unable to answer  Intimate Partner Violence: Not At Risk (06/09/2023)   Received from [facility]   Humiliation, Afraid, Rape, and Kick questionnaire    Within the last year, have you been afraid of your partner or ex-partner?: No    Within the last year, have you been humiliated or emotionally abused in other ways by your partner or ex-partner?: No    Within the last year, have  you  been kicked, hit, slapped, or otherwise physically hurt by your partner or ex-partner?: No    Within the last year, have you been raped or forced to have any kind of sexual activity by your partner or ex-partner?: No    Family History: No family history on file.  Current Medications:  Current Outpatient Medications:    acetaminophen  (TYLENOL ) 325 MG tablet, Take 650 mg by mouth every 4 (four) hours as needed for mild pain or fever., Disp: , Rfl:    allopurinol  (ZYLOPRIM ) 100 MG tablet, TAKE 1 TABLET (100 MG TOTAL) BY MOUTH DAILY., Disp: 60 tablet, Rfl: 5   AMBULATORY NON FORMULARY MEDICATION, 1 Device by Does not apply route at bedtime. BiPap, Disp: , Rfl:    amLODipine  (NORVASC ) 10 MG tablet, Take 1 tablet (10 mg total) by mouth daily., Disp: 90 tablet, Rfl: 1   ascorbic acid (VITAMIN C) 500 MG tablet, Take 500 mg by mouth daily., Disp: , Rfl:    Baclofen  5 MG TABS, Take 1 tablet (5 mg total) by mouth 3 (three) times daily., Disp: 90 tablet, Rfl: 1   bisacodyl  (DULCOLAX) 10 MG suppository, Place 10 mg rectally daily as needed (constipation)., Disp: , Rfl:    bisacodyl  (DULCOLAX) 5 MG EC tablet, Take 1 tablet (5 mg total) by mouth daily as needed for moderate constipation., Disp: , Rfl:    famotidine  (PEPCID ) 20 MG tablet, TAKE 1 TABLET (20 MG TOTAL) BY MOUTH DAILY., Disp: 60 tablet, Rfl: 5   feeding supplement (ENSURE ENLIVE / ENSURE PLUS) LIQD, Take 237 mLs by mouth 2 (two) times daily between meals., Disp: , Rfl:    fexofenadine  (ALLEGRA ) 180 MG tablet, Take 1 tablet (180 mg total) by mouth daily as needed for allergies or rhinitis., Disp: 30 tablet, Rfl: 1   FLUoxetine  (PROZAC ) 20 MG capsule, TAKE 1 CAPSULE (20 MG TOTAL) BY MOUTH DAILY., Disp: 60 capsule, Rfl: 5   gabapentin  (NEURONTIN ) 100 MG capsule, Take 1 capsule (100 mg total) by mouth 2 (two) times daily., Disp: 60 capsule, Rfl: 1   Menthol , Topical Analgesic, 4 % GEL, Apply 1 application  topically 2 (two) times daily. To both legs,  Disp: , Rfl:    metoprolol  succinate (TOPROL -XL) 25 MG 24 hr tablet, Take 1 tablet (25 mg total) by mouth daily., Disp: 90 tablet, Rfl: 3   Multiple Vitamin (MULTIVITAMIN WITH MINERALS) TABS tablet, Take 1 tablet by mouth daily., Disp: , Rfl:    nitroGLYCERIN  (NITROSTAT ) 0.4 MG SL tablet, Place 0.4 mg under the tongue every 5 (five) minutes as needed for chest pain., Disp: , Rfl:    NON FORMULARY, Take 1 Container by mouth 2 (two) times daily. Magic Cups, Disp: , Rfl:    ondansetron  (ZOFRAN ) 4 MG tablet, Take 1 tablet (4 mg total) by mouth every 12 (twelve) hours as needed for vomiting or nausea., Disp: 30 tablet, Rfl: 1   ondansetron  (ZOFRAN ) 4 MG tablet, Take 1 tablet (4 mg total) by mouth every 8 (eight) hours as needed for nausea or vomiting., Disp: 20 tablet, Rfl: 0   silver  sulfADIAZINE  (SILVADENE ) 1 % cream, Apply 1 Application topically daily., Disp: 50 g, Rfl: 0   simvastatin  (ZOCOR ) 40 MG tablet, TAKE 1 TABLET (40 MG TOTAL) BY MOUTH DAILY AT 6 PM., Disp: 60 tablet, Rfl: 5   thiamine 100 MG tablet, Take 100 mg by mouth daily., Disp: , Rfl:    traZODone  (DESYREL ) 50 MG tablet, TAKE 1 TABLET (50 MG TOTAL) BY  MOUTH AT BEDTIME AS NEEDED., Disp: 60 tablet, Rfl: 5   Allergies: Allergies  Allergen Reactions   Aspirin Other (See Comments)    Hx of aneurysm, told to avoid by MD  Other Reaction(s): Other (See Comments)    Hx of aneurysm, told to avoid by MD    Per patient cannot take due to aneurysm    dr told pt not to take ASA after aneurysm   Honey Bee Venom    Other Other (See Comments)    Blood thinners- wife wants documented that patient should not be on blood thinners due to past medical history (Hx of aneurysm)  Red meat - causes gout  Other Reaction(s): Other (See Comments)    Blood thinners- wife wants documented that patient should not be on blood thinners due to past medical history (Hx of aneurysm) Red meat - causes gout   Shrimp (Diagnostic) Other (See Comments)     Gout flare   Shrimp Extract     Other Reaction(s): Other (See Comments)    Gout flare    REVIEW OF SYSTEMS:   Review of Systems  Constitutional:  Negative for chills, fatigue and fever.  HENT:   Negative for lump/mass, mouth sores, nosebleeds, sore throat and trouble swallowing.   Eyes:  Negative for eye problems.  Respiratory:  Negative for cough and shortness of breath.   Cardiovascular:  Negative for chest pain, leg swelling and palpitations.  Gastrointestinal:  Negative for abdominal pain, constipation, diarrhea, nausea and vomiting.  Genitourinary:  Negative for bladder incontinence, difficulty urinating, dysuria, frequency, hematuria and nocturia.   Musculoskeletal:  Negative for arthralgias, back pain, flank pain, myalgias and neck pain.  Skin:  Negative for itching and rash.  Neurological:  Negative for dizziness, headaches and numbness.  Hematological:  Does not bruise/bleed easily.  Psychiatric/Behavioral:  Negative for depression, sleep disturbance and suicidal ideas. The patient is not nervous/anxious.   All other systems reviewed and are negative.    VITALS:   Blood pressure 133/83, pulse 67, resp. rate 16, SpO2 99%.  Wt Readings from Last 3 Encounters:  07/10/23 155 lb 13.8 oz (70.7 kg)  06/23/23 156 lb (70.8 kg)  12/06/22 157 lb 6.5 oz (71.4 kg)    There is no height or weight on file to calculate BMI.  Performance status (ECOG): 3 - Symptomatic, >50% confined to bed  PHYSICAL EXAM:   Physical Exam Vitals and nursing note reviewed. Exam conducted with a chaperone present.  Constitutional:      Appearance: Normal appearance.  Cardiovascular:     Rate and Rhythm: Normal rate and regular rhythm.     Pulses: Normal pulses.     Heart sounds: Normal heart sounds.  Pulmonary:     Effort: Pulmonary effort is normal.     Breath sounds: Normal breath sounds.  Abdominal:     Palpations: Abdomen is soft. There is no hepatomegaly, splenomegaly or mass.      Tenderness: There is no abdominal tenderness.  Musculoskeletal:     Right lower leg: No edema.     Left lower leg: No edema.  Lymphadenopathy:     Cervical: No cervical adenopathy.     Right cervical: No superficial, deep or posterior cervical adenopathy.    Left cervical: No superficial, deep or posterior cervical adenopathy.     Upper Body:     Right upper body: No supraclavicular or axillary adenopathy.     Left upper body: No supraclavicular or axillary adenopathy.  Neurological:  General: No focal deficit present.     Mental Status: He is alert and oriented to person, place, and time.  Psychiatric:        Mood and Affect: Mood normal.        Behavior: Behavior normal.     LABS:      Latest Ref Rng & Units 08/19/2023    9:43 AM 07/10/2023    3:58 PM 06/23/2023    2:33 PM  CBC  WBC 4.0 - 10.5 K/uL 9.0  6.2  7.3   Hemoglobin 13.0 - 17.0 g/dL 86.2  86.9  87.8   Hematocrit 39.0 - 52.0 % 40.8  39.6  36.9   Platelets 150 - 400 K/uL 251  222  297       Latest Ref Rng & Units 08/19/2023    9:43 AM 07/10/2023    3:58 PM 06/23/2023    2:33 PM  CMP  Glucose 70 - 99 mg/dL 899  99  884   BUN 8 - 23 mg/dL 17  21  26    Creatinine 0.61 - 1.24 mg/dL 8.51  8.59  8.72   Sodium 135 - 145 mmol/L 137  133  136   Potassium 3.5 - 5.1 mmol/L 3.5  3.7  3.3   Chloride 98 - 111 mmol/L 104  101  102   CO2 22 - 32 mmol/L 24  23  28    Calcium 8.9 - 10.3 mg/dL 9.6  9.3  9.1   Total Protein 6.5 - 8.1 g/dL 7.5   6.9   Total Bilirubin 0.0 - 1.2 mg/dL 0.7   0.3   Alkaline Phos 38 - 126 U/L 107   98   AST 15 - 41 U/L 15   11   ALT 0 - 44 U/L 8   10      No results found for: CEA1, CEA / No results found for: CEA1, CEA Lab Results  Component Value Date   PSA1 2.2 07/16/2021   No results found for: CAN199 No results found for: CAN125  No results found for: TOTALPROTELP, ALBUMINELP, A1GS, A2GS, BETS, BETA2SER, GAMS, MSPIKE, SPEI No results found for: TIBC,  FERRITIN, IRONPCTSAT Lab Results  Component Value Date   LDH 136 08/19/2023   LDH 136 08/28/2021     STUDIES:   CT ABDOMEN PELVIS W CONTRAST Result Date: 08/24/2023 CLINICAL DATA:  Bilateral kidney masses. EXAM: CT ABDOMEN AND PELVIS WITH CONTRAST TECHNIQUE: Multidetector CT imaging of the abdomen and pelvis was performed using the standard protocol following bolus administration of intravenous contrast. RADIATION DOSE REDUCTION: This exam was performed according to the departmental dose-optimization program which includes automated exposure control, adjustment of the mA and/or kV according to patient size and/or use of iterative reconstruction technique. CONTRAST:  OMNIPAQUE  IOHEXOL  300 MG/ML  SOLN COMPARISON:  09/12/2022. FINDINGS: Lower chest: The heart is enlarged and there is a trace pericardial effusion. Pacemaker leads are present in the heart. Coronary artery calcifications are noted. Subpleural reticulations are present at the lung bases. There is a stable 4 mm right lower lobe pulmonary nodule, axial image 4. Hepatobiliary: No focal liver abnormality is seen. No gallstones, gallbladder wall thickening, or biliary dilatation. Pancreas: Unremarkable. No pancreatic ductal dilatation or surrounding inflammatory changes. Spleen: Normal in size without focal abnormality. Adrenals/Urinary Tract: The adrenal glands are within normal limits. There is a complex cystic and solid mass in the mid to lower pole of the right kidney measuring 5.0 x 4.6 x 5.5 cm, slightly  increased in size from the prior exam. There is a complex cystic and solid mass in the left kidney measuring 3.1 x 3.1 x 2.9 cm, slightly increased in size. There is a stable enhancing endophytic lesion along the anterior aspect of the lower pole of the right kidney measuring 8 mm, not significantly changed. There is a stable indeterminate 9 mm lesion in the lower pole of the left kidney. Ablation changes are noted in the lower pole  the left kidney. No renal calculus or hydronephrosis is seen bilaterally. There is diffuse bladder wall thickening. A suprapubic catheter is present in the urinary bladder. Stomach/Bowel: Stomach is within normal limits. Appendix is not seen. No evidence of bowel wall thickening, distention, or inflammatory changes. No pneumatosis or free air is seen. Scattered diverticular present along the colon without evidence of diverticulitis. A large amount of retained stool is present in the rectum. Vascular/Lymphatic: Cyst aortic atherosclerosis. Prominent lymph nodes are noted along the external iliac chain on the left. The renal veins appear patent bilaterally. Reproductive: Prostate gland is not seen due to streak artifact. Other: No abdominopelvic ascites. A neurostimulator device is noted on the right with leads in the thoracic spinal canal. Musculoskeletal: Total hip arthroplasty changes are present on the left. There is bony deformity of the at the left hip. Degenerative changes are noted in the thoracic spine. No acute fracture is seen. IMPRESSION: 1. Increased size of bilateral renal masses measuring up to 5.5 cm on the right in 3.1 cm on the left, compatible with renal cell carcinoma. 2. Stable small bilateral renal neoplasms. 3. Stable 4 mm right lower lobe pulmonary nodule. 4. Diffuse bladder wall thickening, possible infectious or inflammatory cystitis. Suprapubic bladder catheter in place. 5. Large amount of retained stool in the rectum with possible fecal ball. 6. Coronary artery calcifications and aortic atherosclerosis. Electronically Signed   By: Leita Birmingham M.D.   On: 08/24/2023 17:55   DG OP Swallowing Func-Medicare/Speech Path Result Date: 08/14/2023 Table formatting from the original result was not included. Modified Barium Swallow Study Patient Details Name: Dylan Martin MRN: [id number] Date of Birth: 04/25/1945  Today's Date: 08/13/2023    End of Session - 08/13/23 1259     Visit Number 1    Number  of Visits 1    Authorization Type [employer]    SLP Start Time 1135    SLP Stop Time  1207    SLP Time Calculation (min) 32 min    Activity Tolerance Patient tolerated treatment well         HPI/PMH: HPI: Rumaldo Difatta is an 78 yo male who was referred for MBSS by Dr. Gloria Zarwolo due to dysphagia s/p ICH 07/27/2020. Pt has been on D3/mech soft and NTL for the past several years. He is currently living at home with his wife. He feeds himself with his left, non-dominant hand.   Clinical Impression: Clinical Impression: Pt presents with min oropharyngeal dysphagia characterized by delay in swallow initiation with thin liquids, triggering after filling the pyriforms (valleculae with nectars) resulting in transient flash penetration with tsp thin and overt aspiration when taking large, sequential straw sips of thin (over burden in the pyriforms and spills into laryngeal vestibule). Pt with a delayed cough when aspiration occurred and it was difficult to see if he was successful in removal of all aspirate. Pt at risk for aspiration with large, sequential sips and will need cues from caregiver to take small sips. Recommend regular textures (cut up bite  size) and ok for thin liquids via single cup sips or can try a coffee stirrer straw via single sips if they desire. This study was reviewed with Pt and his wife and written recommendations were provided. No further SLP services indicated at this time.  Factors that may increase risk of adverse event in presence of aspiration Noe & Lianne 2021): Factors that may increase risk of adverse event in presence of aspiration Noe & Lianne 2021): Limited mobility; Dependence for feeding and/or oral hygiene   Recommendations/Plan: Swallowing Evaluation Recommendations Swallowing Evaluation Recommendations Recommendations: PO diet PO Diet Recommendation: Dysphagia 3 (Mechanical soft); Regular; Thin liquids (Level 0) Liquid Administration via: Cup; No straw Medication  Administration: Whole meds with puree Supervision: Patient able to self-feed; Set-up assistance for safety; Full supervision/cueing for swallowing strategies Swallowing strategies  : Slow rate; Small bites/sips; Multiple dry swallows after each bite/sip Postural changes: Position pt fully upright for meals; Stay upright 30-60 min after meals Oral care recommendations: Oral care BID (2x/day)   Treatment Plan Treatment Plan Treatment recommendations: No treatment recommended at this time Follow-up recommendations: No SLP follow up   Recommendations Recommendations for follow up therapy are one component of a multi-disciplinary discharge planning process, led by the attending physician.  Recommendations may be updated based on patient status, additional functional criteria and insurance authorization.  Assessment: Orofacial Exam: Orofacial Exam Oral Cavity: Oral Hygiene: WFL Oral Cavity - Dentition: Dentures, top; Dentures, bottom Orofacial Anatomy: WFL Oral Motor/Sensory Function: WFL   Anatomy: Anatomy: WFL  Boluses Administered: Boluses Administered Boluses Administered: Thin liquids (Level 0); Mildly thick liquids (Level 2, nectar thick); Puree; Solid   Oral Impairment Domain: Oral Impairment Domain Lip Closure: Escape from interlabial space or lateral juncture, no extension beyond vermillion border (cup thin) Tongue control during bolus hold: Cohesive bolus between tongue to palatal seal Bolus preparation/mastication: Slow prolonged chewing/mashing with complete recollection Bolus transport/lingual motion: Brisk tongue motion Oral residue: Trace residue lining oral structures Location of oral residue : Tongue Initiation of pharyngeal swallow : Pyriform sinuses (for thins, valleculae for NTL)   Pharyngeal Impairment Domain: Pharyngeal Impairment Domain Soft palate elevation: No bolus between soft palate (SP)/pharyngeal wall (PW) Laryngeal elevation: Complete superior movement of thyroid cartilage with complete  approximation of arytenoids to epiglottic petiole Anterior hyoid excursion: Complete anterior movement Epiglottic movement: Complete inversion Laryngeal vestibule closure: Incomplete, narrow column air/contrast in laryngeal vestibule Pharyngeal stripping wave : Present - complete Pharyngeal contraction (A/P view only): N/A Pharyngoesophageal segment opening: Complete distension and complete duration, no obstruction of flow Tongue base retraction: No contrast between tongue base and posterior pharyngeal wall (PPW) Pharyngeal residue: Complete pharyngeal clearance   Esophageal Impairment Domain: Esophageal Impairment Domain Esophageal clearance upright position: Esophageal retention   Pill: Pill Consistency administered: Puree Puree: WFL   Penetration/Aspiration Scale Score: Penetration/Aspiration Scale Score 1.  Material does not enter airway: Mildly thick liquids (Level 2, nectar thick); Puree; Solid; Pill 8.  Material enters airway, passes BELOW cords without attempt by patient to eject out (silent aspiration) : Thin liquids (Level 0) (sequential straw sips, ok with single cup sips)   Compensatory Strategies: Compensatory Strategies Compensatory strategies: Yes Straw: Ineffective Ineffective Straw: Thin liquid (Level 0) Multiple swallows: Effective    General Information: Caregiver present: Yes  Diet Prior to this Study: Dysphagia 3 (mechanical soft); Mildly thick liquids (Level 2, nectar thick)   Temperature : Normal   Respiratory Status: WFL   Supplemental O2: None (Room air)   History of Recent Intubation:  No  Behavior/Cognition: Alert; Cooperative; Pleasant mood  Self-Feeding Abilities: Able to self-feed  Baseline vocal quality/speech: Normal; Hypophonia/low volume  Volitional Cough: Able to elicit  Volitional Swallow: Able to elicit  Exam Limitations: No limitations  Pain: Pain Assessment Pain Assessment: No/denies pain   SLP visit diagnosis: SLP Visit Diagnosis: Dysphagia, oropharyngeal phase (R13.12)   Past  Medical History:    Past Medical History: Diagnosis Date  Aneurysm of other specified arteries (HCC)    Aphasia following cerebral infarction    Atherosclerotic heart disease of native coronary artery without angina pectoris    Benign prostatic hyperplasia with lower urinary tract symptoms    Benign prostatic hyperplasia without lower urinary tract symptoms    Bradycardia, unspecified    Cerebral infarction (HCC)    Chronic kidney disease, stage 3 unspecified (HCC)    Cognitive communication deficit    Dysarthria    Dysphagia    Encephalopathy    Essential (primary) hypertension    Gastro-esophageal reflux disease without esophagitis    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HCC)    Hemiplegia (HCC)    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)    Hyperlipidemia    Hyperosmolality and hypernatremia    Muscle weakness (generalized)    Myocardial infarct, old    Neuromuscular dysfunction of bladder, unspecified    Nontraumatic intracerebral hemorrhage (HCC)    Nontraumatic intracerebral hemorrhage (HCC)    Obstructive sleep apnea    Paroxysmal atrial fibrillation (HCC)    Presence of cardiac pacemaker    Presence of coronary angioplasty implant and graft    Pressure ulcer    Renovascular hypertension    Retention of urine    Seizures (HCC)    TIA (transient ischemic attack)    Type 2 diabetes mellitus without complication (HCC)    Unspecified atrial fibrillation (HCC)    Unspecified disorder of synovium and tendon, right shoulder    Unspecified lack of coordination    Weakness      Past Surgical History:     Past Surgical History: Procedure Laterality Date  FEMUR SURGERY Left    HIP SURGERY Left    IR GASTROSTOMY TUBE REMOVAL   02/26/2022  SPINAL CORD STIMULATOR IMPLANT       per wife nerve stimulator in back    Thank you,  Lamar Candy, CCC-SLP [phone]  PORTER,DABNEY 08/13/2023, 1:17 PM CLINICAL DATA:  Patient with a history of CVA and dysphagia. EXAM: MODIFIED BARIUM  SWALLOW TECHNIQUE: Different consistencies of barium were administered orally to the patient by the Speech Pathologist. Imaging of the pharynx was performed in the lateral projection. Warren Dais, NP was present in the fluoroscopy room during this study, which was supervised and interpreted by Dr. Jennefer. Radiologist, not in attendance for the exam. Different consistencies of barium were administered orally to the patient by the Speech Pathologist. Imaging of the pharynx was performed in the lateral projection. The radiologist was present in the fluoroscopy room for this study, providing personal supervision. FLUOROSCOPY: Radiation Exposure Index (as provided by the fluoroscopic device): 24.5 mGy Kerma COMPARISON:  None Available. FINDINGS: Vestibular Penetration: Flash vestibular penetration with thin barium. Aspiration: One episode of aspiration with rapid gulping of thin barium. Other: 13 mm barium tablet administered which was easily swallowed and passed normally. IMPRESSION: Flash vestibular penetration with thin barium. One episode of aspiration with rapid gulping of thin barium. Please refer to the Speech Pathologists report for complete details and recommendations. Electronically Signed   By: Ester  Suttle M.D.   On: 08/13/2023 13:47  CUP PACEART REMOTE DEVICE CHECK Result Date: 08/06/2023 Pacemaker:  Scheduled remote reviewed. Normal device function.  Presenting rhythm: AP/VP Next remote 91 days. ML, CVRS Pacemaker:  Scheduled remote reviewed. Normal device function.  Presenting rhythm: AP/VP Next remote 91 days. ML, CVRS

## 2023-08-25 NOTE — Patient Instructions (Addendum)
 [facility]  Discharge Instructions  You were seen and examined today by Dr. Rogers.  Dr. Rogers discussed your most recent lab work and CT scan which revealed that the labs and kidney masses are considered stable as they have only grown millimeters.  Dr. Katragadda has recommended continued observation.  Follow-up as scheduled.  Thank you for choosing  [facility] to provide your oncology and hematology care.   To afford each patient quality time with our provider, please arrive at least 15 minutes before your scheduled appointment time. You may need to reschedule your appointment if you arrive late (10 or more minutes). Arriving late affects you and other patients whose appointments are after yours.  Also, if you miss three or more appointments without notifying the office, you may be dismissed from the clinic at the provider's discretion.    Again, thank you for choosing [facility].  Our hope is that these requests will decrease the amount of time that you wait before being seen by our physicians.   If you have a lab appointment with the Cancer Center - please note that after April 8th, all labs will be drawn in the cancer center.  You do not have to check in or register with the main entrance as you have in the past but will complete your check-in at the cancer center.            _____________________________________________________________  Should you have questions after your visit to [facility], please contact our office at [phone] and follow the prompts.  Our office hours are 8:00 a.m. to 4:30 p.m. Monday - Thursday and 8:00 a.m. to 2:30 p.m. Friday.  Please note that voicemails left after 4:00 p.m. may not be returned until the following business day.  We are closed weekends and all major holidays.  You do have access to a nurse 24-7, just call the main number to the clinic [phone] and  do not press any options, hold on the line and a nurse will answer the phone.    For prescription refill requests, have your pharmacy contact our office and allow 72 hours.    Masks are no longer required in the cancer centers. If you would like for your care team to wear a mask while they are taking care of you, please let them know. You may have one support person who is at least 80 years old accompany you for your appointments.

## 2023-08-26 ENCOUNTER — Telehealth: Payer: Self-pay | Admitting: Family Medicine

## 2023-08-26 ENCOUNTER — Other Ambulatory Visit: Payer: Self-pay | Admitting: Family Medicine

## 2023-08-26 DIAGNOSIS — M62469 Contracture of muscle, unspecified lower leg: Secondary | ICD-10-CM | POA: Insufficient documentation

## 2023-08-26 DIAGNOSIS — L299 Pruritus, unspecified: Secondary | ICD-10-CM | POA: Insufficient documentation

## 2023-08-26 NOTE — Telephone Encounter (Signed)
 He still needs to get the labs done for Me[place] - her orders are than different than Dr Katherleen

## 2023-08-26 NOTE — Assessment & Plan Note (Signed)
 Start: Allegra  (fexofenadine ) 180 mg once daily. Taper Off: Prozac  (fluoxetine ) gradually over 1 week.  Use gentle, fragrance-free products such as: Dove Unscented Soap Baby Shampoo (fragrance-free) Apply topical hydrocortisone cream as needed to relieve itching. Use unscented, hypoallergenic moisturizers daily, especially immediately after bathing, to lock in moisture and reduce skin dryness.

## 2023-08-26 NOTE — Telephone Encounter (Unsigned)
 Copied from CRM #[id number]. Topic: General - Other >> Aug 25, 2023  4:46 PM Turkey B wrote: Reason for CRM: Patient's wife called and states she was trying to get a reclining wheelchair, she got just a regular wheelchair last month, but wants to switch it out for the reclining one, so patient can have  help with chair lifting up  have is catheter work done. Patient states was tlod that order that Dr Zarwolo sent was voided, not aure why. Please cb

## 2023-08-26 NOTE — Assessment & Plan Note (Signed)
 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 He has a history of receiving physical therapy; however, the therapy did not result in any improvement in the contractures. He continues to have limited mobility in the lower extremities.  Referral placed to [facility] in [place]

## 2023-08-26 NOTE — Telephone Encounter (Signed)
 Pt wife already aware.

## 2023-08-27 ENCOUNTER — Telehealth: Payer: Self-pay

## 2023-08-27 ENCOUNTER — Telehealth: Payer: Self-pay | Admitting: Family Medicine

## 2023-08-27 NOTE — Telephone Encounter (Signed)
 Copied from CRM #[id number]. Topic: General - Other >> Aug 27, 2023 12:33 PM Everette C wrote: Reason for CRM: The patient's wife has called for an update on the completion/submission of paperwork with the [employer] program. The patient has been told that their consent paperwork must be received by the program no later than 08/29/23

## 2023-08-27 NOTE — Telephone Encounter (Signed)
 [facility] Noted Copied Scanned Original in provider box Copy at front desk

## 2023-08-28 NOTE — Telephone Encounter (Signed)
 I sent a message to [employer] and they will work on switching out the current chair for the reclining chair

## 2023-08-28 NOTE — Telephone Encounter (Signed)
 Let pt know this has been completed and faxed to [facility]

## 2023-08-28 NOTE — Telephone Encounter (Signed)
 Completed and faxed.

## 2023-08-28 NOTE — Telephone Encounter (Signed)
Pt wife made aware

## 2023-08-28 NOTE — Telephone Encounter (Signed)
 Completed and faxed to [facility]

## 2023-08-29 ENCOUNTER — Telehealth: Payer: Self-pay

## 2023-08-29 ENCOUNTER — Telehealth: Payer: Self-pay | Admitting: Family Medicine

## 2023-08-29 DIAGNOSIS — E559 Vitamin D deficiency, unspecified: Secondary | ICD-10-CM | POA: Diagnosis not present

## 2023-08-29 DIAGNOSIS — R7301 Impaired fasting glucose: Secondary | ICD-10-CM | POA: Diagnosis not present

## 2023-08-29 DIAGNOSIS — E7849 Other hyperlipidemia: Secondary | ICD-10-CM | POA: Diagnosis not present

## 2023-08-29 DIAGNOSIS — E038 Other specified hypothyroidism: Secondary | ICD-10-CM | POA: Diagnosis not present

## 2023-08-29 NOTE — Telephone Encounter (Signed)
 Medical supplies needed   Patient spouse came by the office needs medical supplies to be faxed to [employer].  Make sure send in insurance cards along with order list rx for incontinence, gloves and bed pads, Certificate of medical necessity. So patient can receive his supplies  Noted Copied Sleeved  Original write up placed in provider box Copy of letter placed in copy folder front desk.

## 2023-08-29 NOTE — Telephone Encounter (Signed)
 Order placed with [employer] for incontinence supplies (adult diapers, liners and bed pads) also 4x4 drain sponges for his suprapubic catheter.

## 2023-08-29 NOTE — Telephone Encounter (Signed)
Kindly refill

## 2023-08-29 NOTE — Telephone Encounter (Signed)
 Copied from CRM [id number]. Topic: Clinical - Home Health Verbal Orders >> Aug 29, 2023  2:08 PM Rosaria BRAVO wrote: Pt's wife called requesting a home health aid and personal care services. For at least 3 days a week, however much [employer] allows. 4 days preferably or more, needs authorization letter from PCP for personal care order and also an aid to sit with him/personal care.  [employer] fax:  [phone]  Once authorization letter is completed and faxed, the pt's wife wants services from [facility] (in network).  [address]  [place]  [phone] >> Aug 29, 2023  2:18 PM Rosaria BRAVO wrote: Pt's wife wants to thank the PCP for ordering the reclining wheelchair.

## 2023-08-29 NOTE — Telephone Encounter (Signed)
 Patient spouse came by needs a refill on Clobetasol Propionate USP, 0.05%  Pharmacy: [facility]

## 2023-08-30 LAB — CMP14+EGFR
ALT: 6 IU/L (ref 0–44)
AST: 13 IU/L (ref 0–40)
Albumin: 4.1 g/dL (ref 3.8–4.8)
Alkaline Phosphatase: 130 IU/L — ABNORMAL HIGH (ref 44–121)
BUN/Creatinine Ratio: 15 (ref 10–24)
BUN: 20 mg/dL (ref 8–27)
Bilirubin Total: 0.5 mg/dL (ref 0.0–1.2)
CO2: 21 mmol/L (ref 20–29)
Calcium: 10.2 mg/dL (ref 8.6–10.2)
Chloride: 102 mmol/L (ref 96–106)
Creatinine, Ser: 1.34 mg/dL — ABNORMAL HIGH (ref 0.76–1.27)
Globulin, Total: 3.5 g/dL (ref 1.5–4.5)
Glucose: 92 mg/dL (ref 70–99)
Potassium: 3.9 mmol/L (ref 3.5–5.2)
Sodium: 139 mmol/L (ref 134–144)
Total Protein: 7.6 g/dL (ref 6.0–8.5)
eGFR: 54 mL/min/1.73 — ABNORMAL LOW (ref >59)

## 2023-08-30 LAB — CBC WITH DIFFERENTIAL/PLATELET
Basophils Absolute: 0 x10E3/uL (ref 0.0–0.2)
Basos: 1 %
EOS (ABSOLUTE): 0.6 x10E3/uL — ABNORMAL HIGH (ref 0.0–0.4)
Eos: 9 %
Hematocrit: 46 % (ref 37.5–51.0)
Hemoglobin: 14.9 g/dL (ref 13.0–17.7)
Immature Grans (Abs): 0 x10E3/uL (ref 0.0–0.1)
Immature Granulocytes: 0 %
Lymphocytes Absolute: 2.1 x10E3/uL (ref 0.7–3.1)
Lymphs: 31 %
MCH: 29.8 pg (ref 26.6–33.0)
MCHC: 32.4 g/dL (ref 31.5–35.7)
MCV: 92 fL (ref 79–97)
Monocytes Absolute: 0.6 x10E3/uL (ref 0.1–0.9)
Monocytes: 8 %
Neutrophils Absolute: 3.4 x10E3/uL (ref 1.4–7.0)
Neutrophils: 51 %
Platelets: 261 x10E3/uL (ref 150–450)
RBC: 5 x10E6/uL (ref 4.14–5.80)
RDW: 14.3 % (ref 11.6–15.4)
WBC: 6.7 x10E3/uL (ref 3.4–10.8)

## 2023-08-30 LAB — TSH+FREE T4
Free T4: 1.12 ng/dL (ref 0.82–1.77)
TSH: 3.46 u[IU]/mL (ref 0.450–4.500)

## 2023-08-30 LAB — VITAMIN D 25 HYDROXY (VIT D DEFICIENCY, FRACTURES): Vit D, 25-Hydroxy: 27.4 ng/mL — ABNORMAL LOW (ref 30.0–100.0)

## 2023-08-30 LAB — LIPID PANEL
Chol/HDL Ratio: 2.5 ratio (ref 0.0–5.0)
Cholesterol, Total: 108 mg/dL (ref 100–199)
HDL: 43 mg/dL (ref >39)
LDL Chol Calc (NIH): 51 mg/dL (ref 0–99)
Triglycerides: 67 mg/dL (ref 0–149)
VLDL Cholesterol Cal: 14 mg/dL (ref 5–40)

## 2023-08-30 LAB — HEMOGLOBIN A1C
Est. average glucose Bld gHb Est-mCnc: 111 mg/dL
Hgb A1c MFr Bld: 5.5 % (ref 4.8–5.6)

## 2023-09-02 DIAGNOSIS — I69141 Monoplegia of lower limb following nontraumatic intracerebral hemorrhage affecting right dominant side: Secondary | ICD-10-CM | POA: Diagnosis not present

## 2023-09-02 DIAGNOSIS — M6281 Muscle weakness (generalized): Secondary | ICD-10-CM | POA: Diagnosis not present

## 2023-09-03 ENCOUNTER — Telehealth: Payer: Self-pay

## 2023-09-03 NOTE — Telephone Encounter (Signed)
 Copied from CRM [id number]. Topic: General - Other >> Sep 02, 2023  4:03 PM Edsel HERO wrote: Patient's wife called to see if provider would send in an order to [facility] for a large rectangle foam block to elevate patients legs in the bed. Please advise.

## 2023-09-03 NOTE — Telephone Encounter (Signed)
 Order placed on [facility]. They will notify us  if they do not carry the item

## 2023-09-04 DIAGNOSIS — M6281 Muscle weakness (generalized): Secondary | ICD-10-CM | POA: Diagnosis not present

## 2023-09-08 DIAGNOSIS — Z9359 Other cystostomy status: Secondary | ICD-10-CM | POA: Diagnosis not present

## 2023-09-09 ENCOUNTER — Ambulatory Visit: Payer: Self-pay | Admitting: Family Medicine

## 2023-09-09 ENCOUNTER — Other Ambulatory Visit: Payer: Self-pay | Admitting: Family Medicine

## 2023-09-09 ENCOUNTER — Telehealth: Payer: Self-pay

## 2023-09-09 DIAGNOSIS — E559 Vitamin D deficiency, unspecified: Secondary | ICD-10-CM

## 2023-09-09 DIAGNOSIS — M6281 Muscle weakness (generalized): Secondary | ICD-10-CM | POA: Diagnosis not present

## 2023-09-09 MED ORDER — VITAMIN D (ERGOCALCIFEROL) 1.25 MG (50000 UNIT) PO CAPS: 50000.0000 [IU] | ORAL_CAPSULE | ORAL | 1 refills | Status: AC

## 2023-09-09 NOTE — Telephone Encounter (Signed)
 Medication is not on pt's medication list.

## 2023-09-09 NOTE — Telephone Encounter (Signed)
 Copied from CRM [id number]. Topic: Clinical - Order For Equipment >> Sep 09, 2023  2:37 PM Jasmin G wrote: Reason for CRM: Pt's PT called to request medical equipment that can help with pt's contractions, please call back at [phone] opt 2 to clarify.

## 2023-09-11 ENCOUNTER — Ambulatory Visit: Payer: Self-pay

## 2023-09-11 NOTE — Telephone Encounter (Signed)
  Reason for Disposition  Health information question, no triage required and triager able to answer question  Answer Assessment - Initial Assessment Questions 1. REASON FOR CALL: What is the main reason for your call? or How can I best help you?  Patient requesting more information on lab results. Reviewed information provided by provider.   Confirmed that wife understands that he should only take the Vitamin D  rx ONCE per week and not daily. She verbalized understanding.   Wife states that the patient drinks a minimum of 64 oz of water per day and is producing approximately 1100 ml of light/dilute yellow urine per day.   Patient's wife verbalized understanding to call back with any future questions as well as when to seek care (decrease in urine output, changes in urine appearance, catheter complications.  Protocols used: Information Only Call - No Triage-A-AH Copied from CRM [id number]. Topic: Clinical - Lab/Test Results >> Sep 11, 2023  4:46 PM Tobias CROME wrote: Reason for CRM: Relayed results to wife. Wife states she gives patient a lot of water every day and is urine output is good.   Has additional questions.

## 2023-09-13 NOTE — Progress Notes (Signed)
 H&P  Chief Complaint: ***  History of Present Illness: ***  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HCC)    Aphasia following cerebral infarction    Atherosclerotic heart disease of native coronary artery without angina pectoris    Benign prostatic hyperplasia with lower urinary tract symptoms    Benign prostatic hyperplasia without lower urinary tract symptoms    Bradycardia, unspecified    Cerebral infarction (HCC)    Chronic kidney disease, stage 3 unspecified (HCC)    Cognitive communication deficit    Dysarthria    Dysphagia    Encephalopathy    Essential (primary) hypertension    Gastro-esophageal reflux disease without esophagitis    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HCC)    Hemiplegia (HCC)    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)    Hyperlipidemia    Hyperosmolality and hypernatremia    Muscle weakness (generalized)    Myocardial infarct, old    Neuromuscular dysfunction of bladder, unspecified    Nontraumatic intracerebral hemorrhage (HCC)    Nontraumatic intracerebral hemorrhage (HCC)    Obstructive sleep apnea    Paroxysmal atrial fibrillation (HCC)    Presence of cardiac pacemaker    Presence of coronary angioplasty implant and graft    Pressure ulcer    Renovascular hypertension    Retention of urine    Seizures (HCC)    TIA (transient ischemic attack)    Type 2 diabetes mellitus without complication (HCC)    Unspecified atrial fibrillation (HCC)    Unspecified disorder of synovium and tendon, right shoulder    Unspecified lack of coordination    Weakness     Past Surgical History:  Procedure Laterality Date   FEMUR SURGERY Left    HIP SURGERY Left    IR GASTROSTOMY TUBE REMOVAL  02/26/2022   SPINAL CORD STIMULATOR IMPLANT     per wife nerve stimulator in back    Home Medications:  Allergies as of 09/16/2023       Reactions   Aspirin Other (See  Comments)   Hx of aneurysm, told to avoid by MD Other Reaction(s): Other (See Comments)    Hx of aneurysm, told to avoid by MD    Per patient cannot take due to aneurysm    dr told pt not to take ASA after aneurysm   Honey Bee Venom    Other Other (See Comments)   Blood thinners- wife wants documented that patient should not be on blood thinners due to past medical history (Hx of aneurysm) Red meat - causes gout Other Reaction(s): Other (See Comments)    Blood thinners- wife wants documented that patient should not be on blood thinners due to past medical history (Hx of aneurysm) Red meat - causes gout   Shrimp (diagnostic) Other (See Comments)   Gout flare   Shrimp Extract    Other Reaction(s): Other (See Comments)    Gout flare        Medication List        Accurate as of September 13, 2023 10:05 AM. If you have any questions, ask your nurse or doctor.          acetaminophen  325 MG tablet Commonly known as: TYLENOL  Take 650 mg by mouth every 4 (four) hours as needed for mild pain or fever.   allopurinol  100 MG tablet Commonly known as: ZYLOPRIM  TAKE 1 TABLET (100 MG TOTAL) BY MOUTH DAILY.   AMBULATORY NON FORMULARY MEDICATION  1 Device by Does not apply route at bedtime. BiPap   amLODipine  10 MG tablet Commonly known as: NORVASC  Take 1 tablet (10 mg total) by mouth daily.   ascorbic acid 500 MG tablet Commonly known as: VITAMIN C Take 500 mg by mouth daily.   Baclofen  5 MG Tabs TAKE 1 TABLET (5 MG TOTAL) BY MOUTH 3 (THREE) TIMES DAILY.   bisacodyl  10 MG suppository Commonly known as: DULCOLAX Place 10 mg rectally daily as needed (constipation).   bisacodyl  5 MG EC tablet Commonly known as: DULCOLAX Take 1 tablet (5 mg total) by mouth daily as needed for moderate constipation.   famotidine  20 MG tablet Commonly known as: PEPCID  TAKE 1 TABLET (20 MG TOTAL) BY MOUTH DAILY.   feeding supplement Liqd Take 237 mLs by mouth 2 (two) times daily between  meals.   fexofenadine  180 MG tablet Commonly known as: ALLEGRA  Take 1 tablet (180 mg total) by mouth daily as needed for allergies or rhinitis.   FLUoxetine  20 MG capsule Commonly known as: PROZAC  TAKE 1 CAPSULE (20 MG TOTAL) BY MOUTH DAILY.   gabapentin  100 MG capsule Commonly known as: NEURONTIN  Take 1 capsule (100 mg total) by mouth 2 (two) times daily.   Menthol  (Topical Analgesic) 4 % Gel Apply 1 application  topically 2 (two) times daily. To both legs   metoprolol  succinate 25 MG 24 hr tablet Commonly known as: TOPROL -XL Take 1 tablet (25 mg total) by mouth daily.   multivitamin with minerals Tabs tablet Take 1 tablet by mouth daily.   nitroGLYCERIN  0.4 MG SL tablet Commonly known as: NITROSTAT  Place 0.4 mg under the tongue every 5 (five) minutes as needed for chest pain.   NON FORMULARY Take 1 Container by mouth 2 (two) times daily. Magic Cups   ondansetron  4 MG tablet Commonly known as: ZOFRAN  Take 1 tablet (4 mg total) by mouth every 12 (twelve) hours as needed for vomiting or nausea.   ondansetron  4 MG tablet Commonly known as: ZOFRAN  Take 1 tablet (4 mg total) by mouth every 8 (eight) hours as needed for nausea or vomiting.   silver  sulfADIAZINE  1 % cream Commonly known as: SILVADENE  Apply 1 Application topically daily.   simvastatin  40 MG tablet Commonly known as: ZOCOR  TAKE 1 TABLET (40 MG TOTAL) BY MOUTH DAILY AT 6 PM.   thiamine 100 MG tablet Commonly known as: VITAMIN B1 Take 100 mg by mouth daily.   traZODone  50 MG tablet Commonly known as: DESYREL  TAKE 1 TABLET (50 MG TOTAL) BY MOUTH AT BEDTIME AS NEEDED.   Vitamin D  (Ergocalciferol ) 1.25 MG (50000 UNIT) Caps capsule Commonly known as: DRISDOL  Take 1 capsule (50,000 Units total) by mouth every 7 (seven) days.        Allergies:  Allergies  Allergen Reactions   Aspirin Other (See Comments)    Hx of aneurysm, told to avoid by MD  Other Reaction(s): Other (See Comments)    Hx of  aneurysm, told to avoid by MD    Per patient cannot take due to aneurysm    dr told pt not to take ASA after aneurysm   Honey Bee Venom    Other Other (See Comments)    Blood thinners- wife wants documented that patient should not be on blood thinners due to past medical history (Hx of aneurysm)  Red meat - causes gout  Other Reaction(s): Other (See Comments)    Blood thinners- wife wants documented that patient should not be on blood thinners  due to past medical history (Hx of aneurysm) Red meat - causes gout   Shrimp (Diagnostic) Other (See Comments)    Gout flare   Shrimp Extract     Other Reaction(s): Other (See Comments)    Gout flare    No family history on file.  Social History:  reports that he has never smoked. He has never used smokeless tobacco. He reports that he does not currently use alcohol. He reports that he does not use drugs.  ROS: A complete review of systems was performed.  All systems are negative except for pertinent findings as noted.  Physical Exam:  Vital signs in last 24 hours: There were no vitals taken for this visit. Constitutional:  Alert and oriented, No acute distress Cardiovascular: Regular rate  Respiratory: Normal respiratory effort GI: Abdomen is soft, nontender, nondistended, no abdominal masses. No CVAT.  Genitourinary: Normal male phallus, testes are descended bilaterally and non-tender and without masses, scrotum is normal in appearance without lesions or masses, perineum is normal on inspection. Lymphatic: No lymphadenopathy Neurologic: Grossly intact, no focal deficits Psychiatric: Normal mood and affect    Impression/Assessment:  ***  Plan:  ***

## 2023-09-15 ENCOUNTER — Ambulatory Visit: Attending: Internal Medicine | Admitting: Internal Medicine

## 2023-09-15 ENCOUNTER — Encounter: Payer: Self-pay | Admitting: Internal Medicine

## 2023-09-15 VITALS — BP 126/78 | HR 70

## 2023-09-15 DIAGNOSIS — R001 Bradycardia, unspecified: Secondary | ICD-10-CM | POA: Diagnosis not present

## 2023-09-15 DIAGNOSIS — I480 Paroxysmal atrial fibrillation: Secondary | ICD-10-CM | POA: Diagnosis not present

## 2023-09-15 NOTE — Telephone Encounter (Signed)
Kindly place orders.

## 2023-09-15 NOTE — Telephone Encounter (Signed)
 Kindly clarify which specific equipment the patient requires per physical therapy recommendations.

## 2023-09-15 NOTE — Telephone Encounter (Signed)
 Would kindly place the orders if possible

## 2023-09-15 NOTE — Patient Instructions (Signed)

## 2023-09-15 NOTE — Progress Notes (Signed)
 HPI [name] is a 78 yo man who returns today for ongoing device management. 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 He is contracted in the legs. He is wheelchair bound. He is undergoing PT.  Allergies  Allergen Reactions   Aspirin Other (See Comments)    Hx of aneurysm, told to avoid by MD  Other Reaction(s): Other (See Comments)    Hx of aneurysm, told to avoid by MD    Per patient cannot take due to aneurysm    dr told pt not to take ASA after aneurysm   Honey Bee Venom    Other Other (See Comments)    Blood thinners- wife wants documented that patient should not be on blood thinners due to past medical history (Hx of aneurysm)  Red meat - causes gout  Other Reaction(s): Other (See Comments)    Blood thinners- wife wants documented that patient should not be on blood thinners due to past medical history (Hx of aneurysm) Red meat - causes gout   Shrimp (Diagnostic) Other (See Comments)    Gout flare   Shrimp Extract     Other Reaction(s): Other (See Comments)    Gout flare     Current Outpatient Medications  Medication Sig Dispense Refill   acetaminophen  (TYLENOL ) 325 MG tablet Take 650 mg by mouth every 4 (four) hours as needed for mild pain or fever.     allopurinol  (ZYLOPRIM ) 100 MG tablet TAKE 1 TABLET (100 MG TOTAL) BY MOUTH DAILY. 60 tablet 5   AMBULATORY NON FORMULARY MEDICATION 1 Device by Does not apply route at bedtime. BiPap     amLODipine  (NORVASC ) 10 MG tablet Take 1 tablet (10 mg total) by mouth daily. 90 tablet 1   ascorbic acid (VITAMIN C) 500 MG tablet Take 500 mg by mouth daily.     Baclofen  5 MG TABS TAKE 1 TABLET (5 MG TOTAL) BY MOUTH 3 (THREE) TIMES DAILY. 90 tablet 11   bisacodyl  (DULCOLAX) 10 MG suppository Place 10 mg rectally daily as needed (constipation).      bisacodyl  (DULCOLAX) 5 MG EC tablet Take 1 tablet (5 mg total) by mouth daily as needed for moderate constipation.     famotidine  (PEPCID ) 20 MG tablet TAKE 1 TABLET (20 MG TOTAL) BY MOUTH DAILY. 60 tablet 5   feeding supplement (ENSURE ENLIVE / ENSURE PLUS) LIQD Take 237 mLs by mouth 2 (two) times daily between meals.     fexofenadine  (ALLEGRA ) 180 MG tablet Take 1 tablet (180 mg total) by mouth daily as needed for allergies or rhinitis. 30 tablet 1   FLUoxetine  (PROZAC ) 20 MG capsule TAKE 1 CAPSULE (20 MG TOTAL) BY MOUTH DAILY. 60 capsule 5   gabapentin  (NEURONTIN ) 100 MG capsule Take 1 capsule (100 mg total) by mouth 2 (two) times daily. 60 capsule 1   Menthol , Topical Analgesic, 4 % GEL Apply 1 application  topically 2 (two) times daily. To both legs     metoprolol  succinate (TOPROL -XL) 25 MG 24 hr tablet Take 1 tablet (25 mg total) by mouth daily. 90 tablet 3   Multiple Vitamin (MULTIVITAMIN WITH MINERALS) TABS tablet Take 1 tablet by mouth daily.     nitroGLYCERIN  (NITROSTAT ) 0.4 MG SL tablet Place 0.4 mg under the tongue every  5 (five) minutes as needed for chest pain.     NON FORMULARY Take 1 Container by mouth 2 (two) times daily. Magic Cups     ondansetron  (ZOFRAN ) 4 MG tablet Take 1 tablet (4 mg total) by mouth every 12 (twelve) hours as needed for vomiting or nausea. 30 tablet 1   ondansetron  (ZOFRAN ) 4 MG tablet Take 1 tablet (4 mg total) by mouth every 8 (eight) hours as needed for nausea or vomiting. 20 tablet 0   silver  sulfADIAZINE  (SILVADENE ) 1 % cream Apply 1 Application topically daily. 50 g 0   simvastatin  (ZOCOR ) 40 MG tablet TAKE 1 TABLET (40 MG TOTAL) BY MOUTH DAILY AT 6 PM. 60 tablet 5   thiamine 100 MG tablet Take 100 mg by mouth daily.     traZODone  (DESYREL ) 50 MG tablet TAKE 1 TABLET (50 MG TOTAL) BY MOUTH AT BEDTIME AS NEEDED. 60 tablet 5   Vitamin D , Ergocalciferol , (DRISDOL ) 1.25 MG (50000 UNIT) CAPS capsule Take 1 capsule (50,000 Units total) by mouth every 7  (seven) days. 27 capsule 1   No current facility-administered medications for this visit.     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HCC)    Aphasia following cerebral infarction    Atherosclerotic heart disease of native coronary artery without angina pectoris    Benign prostatic hyperplasia with lower urinary tract symptoms    Benign prostatic hyperplasia without lower urinary tract symptoms    Bradycardia, unspecified    Cerebral infarction (HCC)    Chronic kidney disease, stage 3 unspecified (HCC)    Cognitive communication deficit    Dysarthria    Dysphagia    Encephalopathy    Essential (primary) hypertension    Gastro-esophageal reflux disease without esophagitis    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HCC)    Hemiplegia (HCC)    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)    Hyperlipidemia    Hyperosmolality and hypernatremia    Muscle weakness (generalized)    Myocardial infarct, old    Neuromuscular dysfunction of bladder, unspecified    Nontraumatic intracerebral hemorrhage (HCC)    Nontraumatic intracerebral hemorrhage (HCC)    Obstructive sleep apnea    Paroxysmal atrial fibrillation (HCC)    Presence of cardiac pacemaker    Presence of coronary angioplasty implant and graft    Pressure ulcer    Renovascular hypertension    Retention of urine    Seizures (HCC)    TIA (transient ischemic attack)    Type 2 diabetes mellitus without complication (HCC)    Unspecified atrial fibrillation (HCC)    Unspecified disorder of synovium and tendon, right shoulder    Unspecified lack of coordination    Weakness     ROS:   All systems reviewed and negative except as noted in the HPI.   Past Surgical History:  Procedure Laterality Date   FEMUR SURGERY Left    HIP SURGERY Left    IR GASTROSTOMY TUBE REMOVAL  02/26/2022   SPINAL CORD STIMULATOR IMPLANT     per wife nerve  stimulator in back     No family history on file.   Social History   Socioeconomic History   Marital status: Married    Spouse name: Not on file   Number of children: Not on file   Years of education: Not on file   Highest education level: Not on file  Occupational History   Not on file  Tobacco Use   Smoking status: Never   Smokeless tobacco: Never  Vaping Use   Vaping status: Never Used  Substance and Sexual Activity   Alcohol use: Not Currently   Drug use: Never   Sexual activity: Not on file  Other Topics Concern   Not on file  Social History Narrative   Not on file   Social Drivers of Health   Financial Resource Strain: Low Risk  (06/09/2023)   Received from [facility]   Overall Financial Resource Strain (CARDIA)    Difficulty of Paying Living Expenses: Not hard at all  Food Insecurity: No Food Insecurity (06/09/2023)   Received from [facility]   Hunger Vital Sign    Within the past 12 months, you worried that your food would run out before you got the money to buy more.: Never true    Within the past 12 months, the food you bought just didn't last and you didn't have money to get more.: Never true  Transportation Needs: No Transportation Needs (06/09/2023)   Received from [facility]   PRAPARE - Transportation    Lack of Transportation (Medical): No    Lack of Transportation (Non-Medical): No  Physical Activity: Patient Unable To Answer (06/09/2023)   Received from [facility]   Exercise Vital Sign    On average, how many days per week do you engage in moderate to strenuous exercise (like a brisk walk)?: Patient unable to answer    On average, how many minutes do you engage in exercise at this level?: Patient unable to answer  Stress: Patient Unable To Answer (06/09/2023)   Received from [facility] - Occupational Stress Questionnaire    Feeling of Stress : Patient unable to answer  Social  Connections: Patient Unable To Answer (06/09/2023)   Received from [facility]   Social Connection and Isolation Panel    In a typical week, how many times do you talk on the phone with family, friends, or neighbors?: Patient unable to answer    How often do you get together with friends or relatives?: Patient unable to answer    How often do you attend church or religious services?: Patient unable to answer    Do you belong to any clubs or organizations such as church groups, unions, fraternal or athletic groups, or school groups?: Patient unable to answer    How often do you attend meetings of the clubs or organizations you belong to?: Patient unable to answer    Are you married, widowed, divorced, separated, never married, or living with a partner?: Patient unable to answer  Intimate Partner Violence: Not At Risk (06/09/2023)   Received from [facility]   Humiliation, Afraid, Rape, and Kick questionnaire    Within the last year, have you been afraid of your partner or ex-partner?: No    Within the last year, have you been humiliated or emotionally abused in other ways by your partner or ex-partner?: No    Within the last year, have you been kicked, hit, slapped, or otherwise physically hurt by your partner or ex-partner?: No    Within the last year, have you been raped or forced to have any kind of sexual activity by your partner or ex-partner?: No     BP 126/78 (BP Location: Left Arm, Cuff Size: Normal)   Pulse 70   SpO2 96%   Physical Exam:  Well appearing NAD  HEENT: Unremarkable Neck:  No JVD, no thyromegally Lymphatics:  No adenopathy Back:  No CVA tenderness Lungs:  Clear HEART:  Regular rate rhythm, no murmurs, no rubs, no clicks Abd:  soft, positive bowel sounds, no organomegally, no rebound, no guarding Ext:  2 plus pulses, no edema, no cyanosis, no clubbing Skin:  No rashes no nodules Neuro:  CN II through XII intact, motor grossly intact   DEVICE  Normal  device function.  See PaceArt for details.   Assess/Plan:  PAF - he is asymptomatic and maintaining NSR 99%. He is not a candidate for systemic anti-coag or for the watchman. CAD - he is s/p MI. No chest pain. R HP - he is contracted. Hopefully this can be worked on. 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   Danelle Tomeko [place],MD

## 2023-09-16 ENCOUNTER — Ambulatory Visit: Admitting: Urology

## 2023-09-16 VITALS — BP 137/85 | HR 60

## 2023-09-16 DIAGNOSIS — Z435 Encounter for attention to cystostomy: Secondary | ICD-10-CM

## 2023-09-16 DIAGNOSIS — R338 Other retention of urine: Secondary | ICD-10-CM

## 2023-09-16 DIAGNOSIS — N319 Neuromuscular dysfunction of bladder, unspecified: Secondary | ICD-10-CM | POA: Diagnosis not present

## 2023-09-16 DIAGNOSIS — Z439 Encounter for attention to unspecified artificial opening: Secondary | ICD-10-CM

## 2023-09-16 DIAGNOSIS — N2889 Other specified disorders of kidney and ureter: Secondary | ICD-10-CM

## 2023-09-16 LAB — CUP PACEART INCLINIC DEVICE CHECK
Battery Remaining Longevity: 41 mo
Battery Voltage: 2.93 V
Brady Statistic AP VP Percent: 82.6 %
Brady Statistic AP VS Percent: 0.01 %
Brady Statistic AS VP Percent: 16.58 %
Brady Statistic AS VS Percent: 0.81 %
Brady Statistic RA Percent Paced: 82.79 %
Brady Statistic RV Percent Paced: 99.18 %
Date Time Interrogation Session: 20250825141200
Implantable Lead Connection Status: 753985
Implantable Lead Connection Status: 753985
Implantable Lead Implant Date: 20180327
Implantable Lead Implant Date: 20180327
Implantable Lead Location: 753859
Implantable Lead Location: 753860
Implantable Lead Model: 5076
Implantable Lead Model: 5076
Implantable Pulse Generator Implant Date: 20180327
Lead Channel Impedance Value: 304 Ohm
Lead Channel Impedance Value: 342 Ohm
Lead Channel Impedance Value: 418 Ohm
Lead Channel Impedance Value: 475 Ohm
Lead Channel Pacing Threshold Amplitude: 0.625 V
Lead Channel Pacing Threshold Amplitude: 0.625 V
Lead Channel Pacing Threshold Amplitude: 0.75 V
Lead Channel Pacing Threshold Amplitude: 0.75 V
Lead Channel Pacing Threshold Pulse Width: 0.4 ms
Lead Channel Pacing Threshold Pulse Width: 0.4 ms
Lead Channel Pacing Threshold Pulse Width: 0.4 ms
Lead Channel Pacing Threshold Pulse Width: 0.4 ms
Lead Channel Sensing Intrinsic Amplitude: 1.75 mV
Lead Channel Sensing Intrinsic Amplitude: 1.875 mV
Lead Channel Sensing Intrinsic Amplitude: 23.5 mV
Lead Channel Sensing Intrinsic Amplitude: 23.5 mV
Lead Channel Setting Pacing Amplitude: 1.5 V
Lead Channel Setting Pacing Amplitude: 2 V
Lead Channel Setting Pacing Pulse Width: 0.4 ms
Lead Channel Setting Sensing Sensitivity: 1.2 mV
Zone Setting Status: 755011

## 2023-09-16 NOTE — Progress Notes (Signed)
 Suprapubic Cath Change  Patient is present today for a suprapubic catheter change due to urinary retention.  7.5 ml of water was drained from the balloon, a 20FR foley cath was removed from the tract with out difficulty.  Suprapubic catheter site was cleaned and prepped in a sterile fashion with Betadinex3  A 20 FR foley cath was replaced into the tract no complications were noted. Urine return was noted, urine Clear yellow in color . 10 ml of sterile water was inflated into the balloon and a bed bag was attached for drainage.  Patient tolerated well. A night bag was given to patient and proper instruction was given on how to switch bags.    Performed by: Exie DASEN. CMA  Follow up: 4 weeks - per wife home health had been changing pt catheter and last cath change was 07/19, pt wife asked if we could change catheter today due to home health having to put a pause on cath changes for now pt wife stated they will need monthly cath changes until home health resumes

## 2023-09-18 ENCOUNTER — Other Ambulatory Visit: Payer: Self-pay | Admitting: Family Medicine

## 2023-09-18 DIAGNOSIS — I10 Essential (primary) hypertension: Secondary | ICD-10-CM

## 2023-09-18 MED ORDER — AMLODIPINE BESYLATE 10 MG PO TABS: 10.0000 mg | ORAL_TABLET | Freq: Every day | ORAL | 1 refills | Status: AC

## 2023-09-18 MED ORDER — METOPROLOL SUCCINATE ER 25 MG PO TB24: 25.0000 mg | ORAL_TABLET | Freq: Every day | ORAL | 3 refills | Status: AC

## 2023-09-18 MED ORDER — GABAPENTIN 100 MG PO CAPS: 100.0000 mg | ORAL_CAPSULE | Freq: Two times a day (BID) | ORAL | 1 refills | Status: AC

## 2023-09-18 NOTE — Telephone Encounter (Signed)
 Copied from CRM [id number]. Topic: Clinical - Medication Refill >> Sep 18, 2023  3:58 PM Emylou G wrote: Medication: amLODipine  (NORVASC ) 10 MG tablet gabapentin  (NEURONTIN ) 100 MG capsule metoprolol  succinate (TOPROL -XL) 25 MG 24 hr tablet   Has the patient contacted their pharmacy? Yes (Agent: If no, request that the patient contact the pharmacy for the refill. If patient does not wish to contact the pharmacy document the reason why and proceed with request.) (Agent: If yes, when and what did the pharmacy advise?) they called  This is the patient's preferred pharmacy:  [facility] - [place], [place] - [address] [place] [address] Phone: [phone] Fax: [phone]    Is this the correct pharmacy for this prescription? Yes If no, delete pharmacy and type the correct one.   Has the prescription been filled recently? No  Is the patient out of the medication? unsure  Has the patient been seen for an appointment in the last year OR does the patient have an upcoming appointment? Yes  Can we respond through MyChart? No  Agent: Please be advised that Rx refills may take up to 3 business days. We ask that you follow-up with your pharmacy.

## 2023-09-19 ENCOUNTER — Encounter: Admitting: Internal Medicine

## 2023-09-24 NOTE — Telephone Encounter (Signed)
 I am just now seeing this message as it was routed directly to me and not to the clinical pool as directed. I called back the number to see what we needed to order and where to send the order. Nurse will have physical therapist return my call this pm

## 2023-09-26 ENCOUNTER — Ambulatory Visit: Admitting: Family Medicine

## 2023-09-29 NOTE — Telephone Encounter (Signed)
 Patient spouse(Elizabeth) is calling to check on the status of leg braces needed for patient. Advised caller that per chart details nurse is awaiting a call from PT before order for braces are placed. Caller states she will give PT a call to ensure he is aware that office is waiting for a call back.

## 2023-10-02 ENCOUNTER — Telehealth: Payer: Self-pay

## 2023-10-02 NOTE — Telephone Encounter (Signed)
 Copied from CRM [id number]. Topic: Clinical - Order For Equipment >> Oct 02, 2023  9:18 AM Dylan Martin wrote: Reason for CRM: Dyna Splint is for knee extension. Maddie from [facility] [employer] in [place] called   Best contact: [phone] ext [phone]

## 2023-10-06 DIAGNOSIS — M6281 Muscle weakness (generalized): Secondary | ICD-10-CM | POA: Diagnosis not present

## 2023-10-06 NOTE — Telephone Encounter (Signed)
 Called and spoke with Maddie and she is going to get the PT to call me at 3:30 to let me know how to write the order and who to send the order to.

## 2023-10-06 NOTE — Telephone Encounter (Signed)
 PT did not know where to send the order. Called [facility] and they would have to order the equipment but I could send the order, Demographics and notes to them and they would begin to process.

## 2023-10-08 NOTE — Telephone Encounter (Signed)
 Faxed order to [facility] after speaking with them on the phone and verifying they could order the needed splint braces. Called wife and gave her contact info for [facility]

## 2023-10-10 NOTE — Telephone Encounter (Signed)
 PCS form completed and put in gloria's box to sign on her return

## 2023-10-20 ENCOUNTER — Ambulatory Visit

## 2023-10-20 ENCOUNTER — Telehealth: Payer: Self-pay

## 2023-10-20 NOTE — Telephone Encounter (Signed)
 Copied from CRM [id number]. Topic: Clinical - Order For Equipment >> Oct 20, 2023  3:03 PM Darshell M wrote: Reason for CRM: Patient needs Prescription for disposable bed pads, gloves and incontinence supplies. Has to be sent to [facility]. Fax prescription to [phone]. Order was sent in but the supplies were never received. Possible mix-up Patient's wife, Mrs. Turki Tapanes [phone]

## 2023-10-21 ENCOUNTER — Encounter: Payer: Self-pay | Admitting: Urology

## 2023-10-21 ENCOUNTER — Ambulatory Visit (INDEPENDENT_AMBULATORY_CARE_PROVIDER_SITE_OTHER): Admitting: Urology

## 2023-10-21 DIAGNOSIS — N319 Neuromuscular dysfunction of bladder, unspecified: Secondary | ICD-10-CM

## 2023-10-21 DIAGNOSIS — R339 Retention of urine, unspecified: Secondary | ICD-10-CM

## 2023-10-21 DIAGNOSIS — L929 Granulomatous disorder of the skin and subcutaneous tissue, unspecified: Secondary | ICD-10-CM

## 2023-10-21 NOTE — Progress Notes (Signed)
 I was asked to see the patient after blood noted during Foley catheter change through suprapubic tube site.  Significant amount of granulation tissue present.  That was bleeding.  This was treated with silver  nitrate sticks.  Total amount of tissue treated was approximately 2 cm

## 2023-10-21 NOTE — Progress Notes (Signed)
 Suprapubic Cath Change  Patient is present today for a suprapubic catheter change due to urinary retention.  10 ml of water was drained from the balloon, a 20 FR foley cath was removed from the tract with out difficulty.  Suprapubic catheter site was cleaned and prepped in a sterile fashion with Betadinex3  A 20 FR foley cath was replaced into the tract complications were noted as: bleeding around Sp Tube.70 CC flush return was noted, urine Clear yellow in color . 10 ml of sterile water was inflated into the balloon and a overnight bag was attached for drainage.  Patient tolerated well. A night bag was given to patient and proper instruction was given on how to switch bags.    Performed by: Carlos, CMA  Follow up: Keep monthly SP Tube change

## 2023-10-22 DIAGNOSIS — I69141 Monoplegia of lower limb following nontraumatic intracerebral hemorrhage affecting right dominant side: Secondary | ICD-10-CM | POA: Diagnosis not present

## 2023-10-22 NOTE — Telephone Encounter (Signed)
 Order was placed through [employer] online in Aug and website says delivery of incontinence supplies was successful. Sent message to rep with [employer] to see if she would figure out why the patient never received the supplies/if they went to wrong address etc. Will await her response

## 2023-10-23 NOTE — Telephone Encounter (Signed)
 The rep said only gauze was delivered and the incontinence supplies were cancelled because they were not covered with [employer]. Pt now has medicaid and will resubmit order

## 2023-10-28 NOTE — Progress Notes (Signed)
 Remote PPM Transmission

## 2023-10-30 NOTE — Telephone Encounter (Signed)
 Order for incontinence supplies sent to [facility] again with updated medicaid insurance and [facility] is delivering today

## 2023-11-03 ENCOUNTER — Telehealth: Payer: Self-pay | Admitting: Family Medicine

## 2023-11-03 ENCOUNTER — Telehealth: Payer: Self-pay

## 2023-11-03 ENCOUNTER — Other Ambulatory Visit: Payer: Self-pay

## 2023-11-03 MED ORDER — UNABLE TO FIND: 0 refills | Status: AC

## 2023-11-03 NOTE — Telephone Encounter (Signed)
 Copied from CRM [id number]. Topic: General - Call Back - No Documentation >> Oct 31, 2023  3:48 PM Avram MATSU wrote: Reason for CRM: patient wife is calling about leg braces and would like to know who sent in that order in. Please advise [phone] (M) >> Nov 03, 2023  3:25 PM Zebedee SAUNDERS wrote: Pt's wife Donal, Lynam [phone] calling regarding braces for prescription, ph: [phone] fax: [phone] [facility] attention: Ozell. Rankin Sor physical therapist ph: [phone] if you need to speak with him. Rankin Sor did not receive prescription. Please call Antavius, [place] [phone] to confirm.

## 2023-11-03 NOTE — Telephone Encounter (Signed)
 Copied from CRM [id number]. Topic: General - Call Back - No Documentation >> Oct 31, 2023  3:48 PM Dylan Martin wrote: Reason for CRM: patient wife is calling about leg braces and would like to know who sent in that order in. Please advise [phone] (M) >> Nov 03, 2023  3:25 PM Dylan Martin wrote: Pt's wife Dylan Martin, Dylan Martin [phone] calling regarding braces for prescription, ph: [phone] fax: [phone] [facility] attention: Dylan Martin. Dylan Martin physical therapist ph: [phone] if you need to speak with him. Dylan Martin did not receive prescription. Please call Dylan Martin, Dylan Martin [phone] to confirm.

## 2023-11-05 NOTE — Telephone Encounter (Signed)
 Spoke with wife and she is no longer wanting the dynasplint braces that were faxed to [facility]. She spoke with a new PT at [facility] and was recommended a new type of brace and when she gets that information she will call back and let us  know what to order and it will be faxed to [facility] clinic

## 2023-11-06 ENCOUNTER — Ambulatory Visit (INDEPENDENT_AMBULATORY_CARE_PROVIDER_SITE_OTHER): Payer: Medicare PPO

## 2023-11-06 ENCOUNTER — Telehealth: Payer: Self-pay

## 2023-11-06 DIAGNOSIS — I495 Sick sinus syndrome: Secondary | ICD-10-CM

## 2023-11-06 NOTE — Telephone Encounter (Signed)
 Copied from CRM [id number]. Topic: Referral - Question >> Nov 06, 2023  2:57 PM Charlet HERO wrote: Reason for CRM: The patient wife is stating that husband went to see Dr Ozell Mulch  [facility] and he suggested that she take him to orthopedic Dr for his left leg being drawed up. The patient would like to have a call back she says that it is the tendons in the back of the leg and she would like to get recommendation or referral. Please call the patient back.

## 2023-11-07 LAB — CUP PACEART REMOTE DEVICE CHECK
Battery Remaining Longevity: 39 mo
Battery Voltage: 2.93 V
Brady Statistic AP VP Percent: 89.98 %
Brady Statistic AP VS Percent: 0.03 %
Brady Statistic AS VP Percent: 8.44 %
Brady Statistic AS VS Percent: 1.56 %
Brady Statistic RA Percent Paced: 90.42 %
Brady Statistic RV Percent Paced: 98.41 %
Date Time Interrogation Session: 20251016040300
Implantable Lead Connection Status: 753985
Implantable Lead Connection Status: 753985
Implantable Lead Implant Date: 20180327
Implantable Lead Implant Date: 20180327
Implantable Lead Location: 753859
Implantable Lead Location: 753860
Implantable Lead Model: 5076
Implantable Lead Model: 5076
Implantable Pulse Generator Implant Date: 20180327
Lead Channel Impedance Value: 285 Ohm
Lead Channel Impedance Value: 342 Ohm
Lead Channel Impedance Value: 399 Ohm
Lead Channel Impedance Value: 418 Ohm
Lead Channel Pacing Threshold Amplitude: 0.5 V
Lead Channel Pacing Threshold Amplitude: 0.625 V
Lead Channel Pacing Threshold Pulse Width: 0.4 ms
Lead Channel Pacing Threshold Pulse Width: 0.4 ms
Lead Channel Sensing Intrinsic Amplitude: 1.75 mV
Lead Channel Sensing Intrinsic Amplitude: 1.75 mV
Lead Channel Sensing Intrinsic Amplitude: 9.875 mV
Lead Channel Sensing Intrinsic Amplitude: 9.875 mV
Lead Channel Setting Pacing Amplitude: 1.5 V
Lead Channel Setting Pacing Amplitude: 2 V
Lead Channel Setting Pacing Pulse Width: 0.4 ms
Lead Channel Setting Sensing Sensitivity: 1.2 mV
Zone Setting Status: 755011

## 2023-11-09 ENCOUNTER — Ambulatory Visit: Payer: Self-pay | Admitting: Internal Medicine

## 2023-11-10 ENCOUNTER — Telehealth: Payer: Self-pay | Admitting: Family Medicine

## 2023-11-10 ENCOUNTER — Other Ambulatory Visit: Payer: Self-pay | Admitting: Family Medicine

## 2023-11-10 DIAGNOSIS — L89311 Pressure ulcer of right buttock, stage 1: Secondary | ICD-10-CM

## 2023-11-10 MED ORDER — SILVER SULFADIAZINE 1 % EX CREA: 1.0000 | TOPICAL_CREAM | Freq: Every day | CUTANEOUS | 0 refills | Status: AC

## 2023-11-10 NOTE — Telephone Encounter (Unsigned)
 Copied from CRM #[id number]. Topic: Clinical - Medication Refill >> Nov 10, 2023  3:35 PM Antony S wrote: Medication: silver  sulfADIAZINE  (SILVADENE ) 1 % cream   Has the patient contacted their pharmacy? Yes (Agent: If no, request that the patient contact the pharmacy for the refill. If patient does not wish to contact the pharmacy document the reason why and proceed with request.) (Agent: If yes, when and what did the pharmacy advise?)  This is the patient's preferred pharmacy:    [id number] - [place], [place] - [address] [place] [address] Phone: [phone] Fax: [phone]    Is this the correct pharmacy for this prescription? Yes If no, delete pharmacy and type the correct one.   Has the prescription been filled recently? No  Is the patient out of the medication? Yes  Has the patient been seen for an appointment in the last year OR does the patient have an upcoming appointment? Yes  Can we respond through MyChart? No  Agent: Please be advised that Rx refills may take up to 3 business days. We ask that you follow-up with your pharmacy.

## 2023-11-10 NOTE — Telephone Encounter (Signed)
 Copied from CRM #[id number]. Topic: Clinical - Medication Refill >> Nov 10, 2023  3:35 PM Antony S wrote: Medication: silver  sulfADIAZINE  (SILVADENE ) 1 % cream   Has the patient contacted their pharmacy? Yes (Agent: If no, request that the patient contact the pharmacy for the refill. If patient does not wish to contact the pharmacy document the reason why and proceed with request.) (Agent: If yes, when and what did the pharmacy advise?)  This is the patient's preferred pharmacy:    [id number] - [place], [place] - [address] [place] [address] Phone: [phone] Fax: [phone]    Is this the correct pharmacy for this prescription? Yes If no, delete pharmacy and type the correct one.   Has the prescription been filled recently? No  Is the patient out of the medication? Yes  Has the patient been seen for an appointment in the last year OR does the patient have an upcoming appointment? Yes  Can we respond through MyChart? No  Agent: Please be advised that Rx refills may take up to 3 business days. We ask that you follow-up with your pharmacy.

## 2023-11-11 NOTE — Telephone Encounter (Signed)
 Wife states she spoke with a physical therapist and he is not recommending bracing at this time. He recommended orthopedic consult to see what could be done to help the contractures. His wife wants someone local but wants a referral to the ortho that you think would be most appropriate for his condition. Wants a call back once the referral is entered. Pls advise on recommendation

## 2023-11-13 NOTE — Progress Notes (Signed)
 Remote PPM Transmission

## 2023-11-17 ENCOUNTER — Other Ambulatory Visit: Payer: Self-pay

## 2023-11-17 DIAGNOSIS — M62469 Contracture of muscle, unspecified lower leg: Secondary | ICD-10-CM

## 2023-11-17 NOTE — Telephone Encounter (Signed)
 Referral placed.

## 2023-11-18 ENCOUNTER — Ambulatory Visit

## 2023-11-18 DIAGNOSIS — R339 Retention of urine, unspecified: Secondary | ICD-10-CM

## 2023-11-18 DIAGNOSIS — L929 Granulomatous disorder of the skin and subcutaneous tissue, unspecified: Secondary | ICD-10-CM

## 2023-11-18 DIAGNOSIS — N319 Neuromuscular dysfunction of bladder, unspecified: Secondary | ICD-10-CM

## 2023-11-18 MED ORDER — CIPROFLOXACIN HCL 500 MG PO TABS
500.0000 mg | ORAL_TABLET | Freq: Once | ORAL | Status: AC
  Administered 2023-11-18: 500 mg via ORAL

## 2023-11-18 NOTE — Progress Notes (Signed)
 During suprapubic tube change, there was some bleeding from granulation tissue around the suprapubic tube site.  I was called to view this.  There was a fair amount of granulation tissue on the left side of the cystostomy.  This was cauterized using 10 separate silver  nitrate sticks.  There was adequate hemostasis after that.  I will repeat the procedure in a month at his next visit for catheter change.

## 2023-11-18 NOTE — Progress Notes (Addendum)
 Suprapubic Cath Change  Patient is present today for a suprapubic catheter change due to urinary retention.  10 ml of water was drained from the balloon, a 20 FR foley cath was removed from the tract with out difficulty.  Suprapubic catheter site was cleaned and prepped in a sterile fashion with Betadinex3  A 20 FR foley cath was replaced into the tract no complications were noted. Urine return was noted, urine Clear yellow in color . 10 ml of sterile water was inflated into the balloon and a night  bag was attached for drainage.  Patient tolerated well. A night bag was given to patient and proper instruction was given on how to switch bags.    Performed by: Carlos, CMA  Follow up: 4 weeks SP Tube Cath Change

## 2023-11-19 ENCOUNTER — Ambulatory Visit

## 2023-11-25 ENCOUNTER — Telehealth: Payer: Self-pay

## 2023-11-25 NOTE — Telephone Encounter (Signed)
 Copied from CRM #[id number]. Topic: Clinical - Prescription Issue >> Nov 25, 2023  2:43 PM Montie POUR wrote: Reason for CRM:  [facility] needs an order for IV Drain Sponges so insurance will pay for them. Please call Almarie at [phone] with any questions.

## 2023-11-27 NOTE — Telephone Encounter (Signed)
 Ordered drain sponges on [employer] website

## 2023-12-09 ENCOUNTER — Telehealth: Payer: Self-pay

## 2023-12-09 NOTE — Telephone Encounter (Signed)
 Patient's wife left message wanting to make an appointment with our office. I called her back and had to leave a message.

## 2023-12-10 NOTE — Telephone Encounter (Signed)
 LVM for the pt that I was returning the call to schedule.

## 2023-12-23 ENCOUNTER — Ambulatory Visit: Admitting: Urology

## 2023-12-23 DIAGNOSIS — R339 Retention of urine, unspecified: Secondary | ICD-10-CM | POA: Diagnosis not present

## 2023-12-23 DIAGNOSIS — N319 Neuromuscular dysfunction of bladder, unspecified: Secondary | ICD-10-CM

## 2023-12-23 DIAGNOSIS — L929 Granulomatous disorder of the skin and subcutaneous tissue, unspecified: Secondary | ICD-10-CM | POA: Diagnosis not present

## 2023-12-23 MED ORDER — CIPROFLOXACIN HCL 500 MG PO TABS
500.0000 mg | ORAL_TABLET | Freq: Once | ORAL | Status: AC
  Administered 2023-12-23: 500 mg via ORAL

## 2023-12-23 NOTE — Progress Notes (Signed)
 Suprapubic Cath Change  Patient is present today for a suprapubic catheter change due to urinary retention.  10 ml of water was drained from the balloon, a 20 FR foley cath was removed from the tract with out difficulty.  Suprapubic catheter site was cleaned and prepped in a sterile fashion with Betadinex3  A 20 FR foley cath was replaced into the tract no complications were noted. Urine return was noted, urine Bloody in color . 10 ml of sterile water was inflated into the balloon and a night bag was attached for drainage.  Patient tolerated well. A night bag was given to patient and proper instruction was given on how to switch bags.  Granulation tissue was cauterized with silver  nitrate sticks  Performed by: Carlos, CMA  Follow up: 4 weeks SP Tube Change

## 2023-12-24 ENCOUNTER — Ambulatory Visit: Admitting: Orthopedic Surgery

## 2023-12-24 ENCOUNTER — Encounter: Payer: Self-pay | Admitting: Orthopedic Surgery

## 2023-12-24 DIAGNOSIS — S73005A Unspecified dislocation of left hip, initial encounter: Secondary | ICD-10-CM

## 2023-12-24 DIAGNOSIS — M24561 Contracture, right knee: Secondary | ICD-10-CM

## 2023-12-24 DIAGNOSIS — M24562 Contracture, left knee: Secondary | ICD-10-CM

## 2023-12-24 NOTE — Patient Instructions (Addendum)
 I will send your referral to [facility] orthopedics you can call them to schedule the number is [phone]. This is for possible botox for the contractures of lower extremity bilateral
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
## 2023-12-25 ENCOUNTER — Telehealth: Payer: Self-pay | Admitting: Orthopedic Surgery

## 2023-12-25 NOTE — Telephone Encounter (Signed)
 Dr. Onesimo pt - spoke w/the pt's spouse Almarie [phone], she stated her spouse was being sent to [facility] for botox and she is fine with going there for the botox, but she wants to know if he has to have hip surgery, is there somewhere else closer that he can go to?

## 2023-12-26 NOTE — Telephone Encounter (Signed)
 Gave pt information from provider and answered questions. No further questions at this time.

## 2023-12-30 ENCOUNTER — Telehealth: Payer: Self-pay | Admitting: Orthopedic Surgery

## 2023-12-30 NOTE — Telephone Encounter (Signed)
 Dr. Onesimo pt - pt's spouse lvm 12/29/23 2:48pm stating he was referred out by Dr. Onesimo and they need information sent to them  [phone]

## 2023-12-30 NOTE — Telephone Encounter (Signed)
 Information was faxed 12/26/23

## 2023-12-31 NOTE — Progress Notes (Signed)
 Patient was seen with CMA who performed suprapubic tube change.  He still had significant granulation tissue at his tube cystostomy site.  This was cauterized using silver  nitrate sticks.  This was an area 2 cm.

## 2024-01-20 ENCOUNTER — Telehealth: Payer: Self-pay | Admitting: Orthopedic Surgery

## 2024-01-20 NOTE — Telephone Encounter (Signed)
 Dr. Onesimo pt - pt's spouse Almarie lvm stating that she wants Dr. JAYSON to save the notes and the x-rays he showed her on the computer that she'll need them when she gets an attorney.  I tried to call the pt back to explain to her how the pt would need to request records and x-rays and if the x-rays were not done here, they'll need to request them from the facility that did them.  When I called, I got the call can't be completed at this time.

## 2024-01-26 ENCOUNTER — Ambulatory Visit: Admitting: Family Medicine

## 2024-01-27 ENCOUNTER — Ambulatory Visit

## 2024-01-27 DIAGNOSIS — N319 Neuromuscular dysfunction of bladder, unspecified: Secondary | ICD-10-CM

## 2024-01-27 DIAGNOSIS — R339 Retention of urine, unspecified: Secondary | ICD-10-CM

## 2024-01-27 MED ORDER — CIPROFLOXACIN HCL 500 MG PO TABS
500.0000 mg | ORAL_TABLET | Freq: Once | ORAL | Status: AC
  Administered 2024-01-27: 500 mg via ORAL

## 2024-01-27 NOTE — Progress Notes (Addendum)
 Suprapubic Cath Change  Patient is present today for a suprapubic catheter change due to urinary retention.  10 ml of water was drained from the balloon, a 20 FR foley cath was removed from the tract with out difficulty.  Suprapubic catheter site was cleaned and prepped in a sterile fashion with Betadinex3  A 16 FR foley cath was replaced into the tract no complications were noted. Urine return was noted, urine Clear yellow in color . 10 ml of sterile water was inflated into the balloon and a night bag was attached for drainage.  Patient tolerated well. A night bag was given to patient and proper instruction was given on how to switch bags.    Performed by: Carlos, CMA  Follow up: 4 weeks SP Tube Change

## 2024-02-05 ENCOUNTER — Ambulatory Visit: Payer: Medicare PPO

## 2024-02-05 DIAGNOSIS — I495 Sick sinus syndrome: Secondary | ICD-10-CM

## 2024-02-05 LAB — CUP PACEART REMOTE DEVICE CHECK
Battery Remaining Longevity: 37 mo
Battery Voltage: 2.92 V
Brady Statistic AP VP Percent: 90.87 %
Brady Statistic AP VS Percent: 0.04 %
Brady Statistic AS VP Percent: 7.59 %
Brady Statistic AS VS Percent: 1.51 %
Brady Statistic RA Percent Paced: 91.31 %
Brady Statistic RV Percent Paced: 98.45 %
Date Time Interrogation Session: 20260115054853
Implantable Lead Connection Status: 753985
Implantable Lead Connection Status: 753985
Implantable Lead Implant Date: 20180327
Implantable Lead Implant Date: 20180327
Implantable Lead Location: 753859
Implantable Lead Location: 753860
Implantable Lead Model: 5076
Implantable Lead Model: 5076
Implantable Pulse Generator Implant Date: 20180327
Lead Channel Impedance Value: 304 Ohm
Lead Channel Impedance Value: 323 Ohm
Lead Channel Impedance Value: 380 Ohm
Lead Channel Impedance Value: 399 Ohm
Lead Channel Pacing Threshold Amplitude: 0.5 V
Lead Channel Pacing Threshold Amplitude: 0.625 V
Lead Channel Pacing Threshold Pulse Width: 0.4 ms
Lead Channel Pacing Threshold Pulse Width: 0.4 ms
Lead Channel Sensing Intrinsic Amplitude: 1.625 mV
Lead Channel Sensing Intrinsic Amplitude: 1.625 mV
Lead Channel Sensing Intrinsic Amplitude: 9.875 mV
Lead Channel Sensing Intrinsic Amplitude: 9.875 mV
Lead Channel Setting Pacing Amplitude: 1.5 V
Lead Channel Setting Pacing Amplitude: 2 V
Lead Channel Setting Pacing Pulse Width: 0.4 ms
Lead Channel Setting Sensing Sensitivity: 1.2 mV
Zone Setting Status: 755011

## 2024-02-06 NOTE — Progress Notes (Signed)
 Remote PPM Transmission

## 2024-02-08 ENCOUNTER — Ambulatory Visit: Payer: Self-pay | Admitting: Student in an Organized Health Care Education/Training Program

## 2024-02-13 ENCOUNTER — Telehealth: Payer: Self-pay | Admitting: Physician Assistant
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
## 2024-02-25 ENCOUNTER — Telehealth: Payer: Self-pay

## 2024-02-25 NOTE — Telephone Encounter (Signed)
 Copied from CRM [id number]. Topic: Clinical - Medication Refill >> Feb 25, 2024  2:42 PM Avram MATSU wrote: Medication: silver  sulfADIAZINE  (SILVADENE ) 1 % cream [[id number]]  Has the patient contacted their pharmacy? Yes (Agent: If no, request that the patient contact the pharmacy for the refill. If patient does not wish to contact the pharmacy document the reason why and proceed with request.) (Agent: If yes, when and what did the pharmacy advise?)  This is the patient's preferred pharmacy:   [id number] - [place], [place] - [address] [place] [address] Phone: [phone] Fax: [phone]  Is this the correct pharmacy for this prescription? Yes If no, delete pharmacy and type the correct one.   Has the prescription been filled recently? No  Is the patient out of the medication? Yes  Has the patient been seen for an appointment in the last year OR does the patient have an upcoming appointment? Yes  Can we respond through MyChart? No  Agent: Please be advised that Rx refills may take up to 3 business days. We ask that you follow-up with your pharmacy.

## 2024-02-27 ENCOUNTER — Ambulatory Visit

## 2024-03-02 ENCOUNTER — Ambulatory Visit

## 2024-03-03 ENCOUNTER — Ambulatory Visit

## 2024-04-06 ENCOUNTER — Ambulatory Visit: Payer: Self-pay

## 2024-05-06 ENCOUNTER — Ambulatory Visit: Payer: Medicare PPO

## 2024-08-05 ENCOUNTER — Ambulatory Visit: Payer: Medicare PPO

## 2024-08-16 ENCOUNTER — Other Ambulatory Visit

## 2024-08-16 ENCOUNTER — Other Ambulatory Visit (HOSPITAL_COMMUNITY)

## 2024-08-24 ENCOUNTER — Ambulatory Visit: Admitting: Oncology

## 2024-09-21 ENCOUNTER — Ambulatory Visit: Admitting: Urology
